# Patient Record
Sex: Female | Born: 1955
Health system: Southern US, Community
[De-identification: ages and names within clinical notes are randomized; demographics above are authoritative.]

## PROBLEM LIST (undated history)

## (undated) DIAGNOSIS — Z860100 Personal history of colon polyps, unspecified: Secondary | ICD-10-CM

## (undated) DIAGNOSIS — I7300 Raynaud's syndrome without gangrene: Secondary | ICD-10-CM

## (undated) DIAGNOSIS — G43829 Menstrual migraine, not intractable, without status migrainosus: Secondary | ICD-10-CM

## (undated) DIAGNOSIS — I10 Essential (primary) hypertension: Secondary | ICD-10-CM

## (undated) DIAGNOSIS — J4520 Mild intermittent asthma, uncomplicated: Secondary | ICD-10-CM

## (undated) DIAGNOSIS — Z8742 Personal history of other diseases of the female genital tract: Secondary | ICD-10-CM

## (undated) DIAGNOSIS — M8580 Other specified disorders of bone density and structure, unspecified site: Secondary | ICD-10-CM

## (undated) DIAGNOSIS — M5020 Other cervical disc displacement, unspecified cervical region: Secondary | ICD-10-CM

## (undated) DIAGNOSIS — D219 Benign neoplasm of connective and other soft tissue, unspecified: Secondary | ICD-10-CM

## (undated) DIAGNOSIS — T7840XA Allergy, unspecified, initial encounter: Secondary | ICD-10-CM

## (undated) DIAGNOSIS — G510 Bell's palsy: Secondary | ICD-10-CM

## (undated) HISTORY — DX: Personal history of colon polyps, unspecified: Z86.0100

## (undated) HISTORY — DX: Raynaud's syndrome without gangrene: I73.00

## (undated) HISTORY — PX: NASAL SINUS SURGERY: SHX719

## (undated) HISTORY — DX: Allergy, unspecified, initial encounter: T78.40XA

## (undated) HISTORY — DX: Menstrual migraine, not intractable, without status migrainosus: G43.829

## (undated) HISTORY — DX: Other cervical disc displacement, unspecified cervical region: M50.20

## (undated) HISTORY — DX: Essential (primary) hypertension: I10

## (undated) HISTORY — PX: SPINE SURGERY: SHX786

## (undated) HISTORY — DX: Personal history of other diseases of the female genital tract: Z87.42

## (undated) HISTORY — DX: Benign neoplasm of connective and other soft tissue, unspecified: D21.9

## (undated) HISTORY — DX: Other specified disorders of bone density and structure, unspecified site: M85.80

## (undated) HISTORY — DX: Bell's palsy: G51.0

## (undated) HISTORY — DX: Mild intermittent asthma, uncomplicated: J45.20

## (undated) HISTORY — DX: Personal history of colonic polyps: Z86.010

---

## 1982-02-14 HISTORY — PX: COLPOSCOPY: SHX161

## 1983-02-15 HISTORY — PX: CERVICAL CONE BIOPSY: SUR198

## 1989-02-14 HISTORY — PX: CERVICAL BIOPSY  W/ LOOP ELECTRODE EXCISION: SUR135

## 1997-05-21 ENCOUNTER — Other Ambulatory Visit: Admission: RE | Admit: 1997-05-21 | Discharge: 1997-05-21 | Payer: Self-pay | Admitting: *Deleted

## 1997-12-09 ENCOUNTER — Other Ambulatory Visit: Admission: RE | Admit: 1997-12-09 | Discharge: 1997-12-09 | Payer: Self-pay | Admitting: Obstetrics and Gynecology

## 1999-01-05 ENCOUNTER — Other Ambulatory Visit: Admission: RE | Admit: 1999-01-05 | Discharge: 1999-01-05 | Payer: Self-pay | Admitting: Obstetrics and Gynecology

## 1999-11-16 ENCOUNTER — Other Ambulatory Visit: Admission: RE | Admit: 1999-11-16 | Discharge: 1999-11-16 | Payer: Self-pay | Admitting: Gynecology

## 2000-01-24 ENCOUNTER — Other Ambulatory Visit: Admission: RE | Admit: 2000-01-24 | Discharge: 2000-01-24 | Payer: Self-pay | Admitting: Obstetrics and Gynecology

## 2000-03-10 ENCOUNTER — Ambulatory Visit (HOSPITAL_COMMUNITY): Admission: AD | Admit: 2000-03-10 | Discharge: 2000-03-10 | Payer: Self-pay | Admitting: Obstetrics & Gynecology

## 2001-03-02 ENCOUNTER — Other Ambulatory Visit: Admission: RE | Admit: 2001-03-02 | Discharge: 2001-03-02 | Payer: Self-pay | Admitting: Obstetrics and Gynecology

## 2003-03-04 ENCOUNTER — Other Ambulatory Visit: Admission: RE | Admit: 2003-03-04 | Discharge: 2003-03-04 | Payer: Self-pay | Admitting: Obstetrics and Gynecology

## 2004-04-16 ENCOUNTER — Other Ambulatory Visit: Admission: RE | Admit: 2004-04-16 | Discharge: 2004-04-16 | Payer: Self-pay | Admitting: Obstetrics and Gynecology

## 2004-06-24 ENCOUNTER — Ambulatory Visit: Payer: Self-pay | Admitting: Unknown Physician Specialty

## 2005-05-30 ENCOUNTER — Other Ambulatory Visit: Admission: RE | Admit: 2005-05-30 | Discharge: 2005-05-30 | Payer: Self-pay | Admitting: Obstetrics and Gynecology

## 2007-07-03 ENCOUNTER — Ambulatory Visit: Payer: Self-pay | Admitting: Unknown Physician Specialty

## 2007-12-19 ENCOUNTER — Ambulatory Visit: Payer: Self-pay | Admitting: Unknown Physician Specialty

## 2008-03-07 LAB — HM COLONOSCOPY: HM Colonoscopy: NORMAL

## 2008-12-30 ENCOUNTER — Ambulatory Visit: Payer: Self-pay | Admitting: Unknown Physician Specialty

## 2010-01-22 ENCOUNTER — Ambulatory Visit: Payer: Self-pay | Admitting: Unknown Physician Specialty

## 2010-04-08 ENCOUNTER — Ambulatory Visit: Payer: Self-pay | Admitting: Unknown Physician Specialty

## 2011-01-10 ENCOUNTER — Ambulatory Visit: Payer: Self-pay | Admitting: Internal Medicine

## 2011-01-12 ENCOUNTER — Ambulatory Visit (INDEPENDENT_AMBULATORY_CARE_PROVIDER_SITE_OTHER): Payer: BC Managed Care – PPO | Admitting: Internal Medicine

## 2011-01-12 ENCOUNTER — Encounter: Payer: Self-pay | Admitting: Internal Medicine

## 2011-01-12 VITALS — BP 126/84 | HR 74 | Temp 97.9°F | Resp 14 | Ht 64.0 in | Wt 150.2 lb

## 2011-01-12 DIAGNOSIS — R635 Abnormal weight gain: Secondary | ICD-10-CM

## 2011-01-12 DIAGNOSIS — G43829 Menstrual migraine, not intractable, without status migrainosus: Secondary | ICD-10-CM | POA: Insufficient documentation

## 2011-01-12 DIAGNOSIS — Z13220 Encounter for screening for lipoid disorders: Secondary | ICD-10-CM

## 2011-01-12 DIAGNOSIS — D126 Benign neoplasm of colon, unspecified: Secondary | ICD-10-CM

## 2011-01-12 DIAGNOSIS — K635 Polyp of colon: Secondary | ICD-10-CM

## 2011-01-12 DIAGNOSIS — J449 Chronic obstructive pulmonary disease, unspecified: Secondary | ICD-10-CM

## 2011-01-12 DIAGNOSIS — Z1239 Encounter for other screening for malignant neoplasm of breast: Secondary | ICD-10-CM

## 2011-01-12 DIAGNOSIS — I10 Essential (primary) hypertension: Secondary | ICD-10-CM

## 2011-01-12 DIAGNOSIS — J329 Chronic sinusitis, unspecified: Secondary | ICD-10-CM

## 2011-01-12 DIAGNOSIS — G43909 Migraine, unspecified, not intractable, without status migrainosus: Secondary | ICD-10-CM | POA: Insufficient documentation

## 2011-01-12 DIAGNOSIS — M542 Cervicalgia: Secondary | ICD-10-CM

## 2011-01-12 NOTE — Assessment & Plan Note (Signed)
Nonsmoker, prior eval by Dr. Gevena Cotton, [facility]  confiirmed asthma with PFTs ,  Controlled with advair used prn (patient preference)

## 2011-01-12 NOTE — Progress Notes (Signed)
Subjective:    Patient ID: Jean Luna, female    DOB: [birth year], 55 y.o.   MRN: [id number]  HPI 55 yo white female referred by the [facility] for primary care.   She has a history of several sinus surgeries in the past  allergic rhinitis and chronic bronchitis with  A history of migraines which have remitted since entering menopause.  Her Cc is the development of exertional dyspnea for the last several months.  She has gained some weight and has not exercised regularly for the past several years.  Denies chest pain, nausea, jaw pain and diaphoresis.  No claudication symptoms.  Past Medical History  Diagnosis Date  . Hypertension   . Asthma   . Migraine, menstrual     resolved with menopause    No current outpatient prescriptions on file prior to visit.    Review of Systems  Constitutional: Negative for fever, chills and unexpected weight change.  HENT: Negative for hearing loss, ear pain, nosebleeds, congestion, sore throat, facial swelling, rhinorrhea, sneezing, mouth sores, trouble swallowing, neck pain, neck stiffness, voice change, postnasal drip, sinus pressure, tinnitus and ear discharge.   Eyes: Negative for pain, discharge, redness and visual disturbance.  Respiratory: Negative for cough, chest tightness, shortness of breath, wheezing and stridor.   Cardiovascular: Negative for chest pain, palpitations and leg swelling.  Musculoskeletal: Negative for myalgias and arthralgias.  Skin: Negative for color change and rash.  Neurological: Negative for dizziness, weakness, light-headedness and headaches.  Hematological: Negative for adenopathy.   BP 126/84  Pulse 74  Temp(Src) 97.9 F (36.6 C) (Oral)  Resp 14  Ht 5\' 4"  (1.626 m)  Wt 150 lb 4 oz (68.153 kg)  BMI 25.79 kg/m2  SpO2 100%     Objective:   Physical Exam  Constitutional: She is oriented to person, place, and time. She appears well-developed and well-nourished.  HENT:  Mouth/Throat: Oropharynx is clear  and moist.  Eyes: EOM are normal. Pupils are equal, round, and reactive to light. No scleral icterus.  Neck: Normal range of motion. Neck supple. No JVD present. No thyromegaly present.  Cardiovascular: Normal rate, regular rhythm, normal heart sounds and intact distal pulses.   Pulmonary/Chest: Effort normal and breath sounds normal.  Abdominal: Soft. Bowel sounds are normal. She exhibits no mass. There is no tenderness.  Musculoskeletal: Normal range of motion. She exhibits no edema.  Lymphadenopathy:    She has no cervical adenopathy.  Neurological: She is alert and oriented to person, place, and time.  Skin: Skin is warm and dry.  Psychiatric: She has a normal mood and affect.       Assessment & Plan:  Exertional dyspnea:  May be due to deconditioning, pulmonary disease, or occult CAD.  Will have her return for fasting lipids to assess risk factors and consider either stress testing or PFTS as next course.   Bronchitis with chronic airway obstruction Nonsmoker, prior eval by Dr. Gevena Cotton, [facility]  confiirmed asthma with PFTs ,  Controlled with advair used prn (patient preference)  Colon polyps By colonoscopy in 2007.  Repeat scope in 2010 was clear. Next one due 2015.  Cervicalgia MRI of cervical spine ordered to rule out spinal stenosis    Updated Medication List Outpatient Encounter Prescriptions as of 01/12/2011  Medication Sig Dispense Refill  . cetirizine (ZYRTEC) 10 MG tablet Take 10 mg by mouth daily.        . fluticasone-salmeterol (ADVAIR HFA) 115-21 MCG/ACT inhaler Inhale 2 puffs into  the lungs daily.        Marland Kitchen telmisartan (MICARDIS) 20 MG tablet Take 20 mg by mouth daily.        . zileuton (ZYFLO) 600 MG TABS Take 600 mg by mouth daily.

## 2011-01-13 ENCOUNTER — Encounter: Payer: Self-pay | Admitting: Internal Medicine

## 2011-01-13 DIAGNOSIS — K635 Polyp of colon: Secondary | ICD-10-CM | POA: Insufficient documentation

## 2011-01-13 DIAGNOSIS — D126 Benign neoplasm of colon, unspecified: Secondary | ICD-10-CM | POA: Insufficient documentation

## 2011-01-13 NOTE — Assessment & Plan Note (Signed)
MRI of cervical spine ordered to rule out spinal stenosis

## 2011-01-13 NOTE — Assessment & Plan Note (Signed)
By colonoscopy in 2007.  Repeat scope in 2010 was clear. Next one due 2015.

## 2011-01-21 ENCOUNTER — Other Ambulatory Visit: Payer: BC Managed Care – PPO

## 2011-01-25 ENCOUNTER — Other Ambulatory Visit (HOSPITAL_COMMUNITY)
Admission: RE | Admit: 2011-01-25 | Discharge: 2011-01-25 | Disposition: A | Discharge status: Home / Self Care | Payer: BC Managed Care – PPO | Source: Ambulatory Visit | Attending: Internal Medicine | Admitting: Internal Medicine

## 2011-01-25 ENCOUNTER — Ambulatory Visit (INDEPENDENT_AMBULATORY_CARE_PROVIDER_SITE_OTHER): Payer: BC Managed Care – PPO | Admitting: Internal Medicine

## 2011-01-25 ENCOUNTER — Encounter: Payer: Self-pay | Admitting: Internal Medicine

## 2011-01-25 VITALS — BP 126/84 | HR 77 | Temp 98.3°F | Resp 16 | Ht 64.0 in | Wt 150.8 lb

## 2011-01-25 DIAGNOSIS — R5383 Other fatigue: Secondary | ICD-10-CM

## 2011-01-25 DIAGNOSIS — Z1239 Encounter for other screening for malignant neoplasm of breast: Secondary | ICD-10-CM

## 2011-01-25 DIAGNOSIS — Z780 Asymptomatic menopausal state: Secondary | ICD-10-CM

## 2011-01-25 DIAGNOSIS — G510 Bell's palsy: Secondary | ICD-10-CM | POA: Insufficient documentation

## 2011-01-25 DIAGNOSIS — Z13220 Encounter for screening for lipoid disorders: Secondary | ICD-10-CM

## 2011-01-25 DIAGNOSIS — Z124 Encounter for screening for malignant neoplasm of cervix: Secondary | ICD-10-CM

## 2011-01-25 DIAGNOSIS — I7300 Raynaud's syndrome without gangrene: Secondary | ICD-10-CM | POA: Insufficient documentation

## 2011-01-25 DIAGNOSIS — M542 Cervicalgia: Secondary | ICD-10-CM

## 2011-01-25 DIAGNOSIS — R5381 Other malaise: Secondary | ICD-10-CM

## 2011-01-25 DIAGNOSIS — Z01419 Encounter for gynecological examination (general) (routine) without abnormal findings: Secondary | ICD-10-CM | POA: Insufficient documentation

## 2011-01-25 DIAGNOSIS — Z1159 Encounter for screening for other viral diseases: Secondary | ICD-10-CM | POA: Insufficient documentation

## 2011-01-25 DIAGNOSIS — R635 Abnormal weight gain: Secondary | ICD-10-CM

## 2011-01-25 DIAGNOSIS — I10 Essential (primary) hypertension: Secondary | ICD-10-CM

## 2011-01-25 DIAGNOSIS — N951 Menopausal and female climacteric states: Secondary | ICD-10-CM

## 2011-01-25 NOTE — Assessment & Plan Note (Addendum)
She has been having neck pain with radiculopathy to the right hand for the last 6 months, with numbness in the thumb and 2nd finger . She has been seeing a chiropractor , who has diagnosed cervical spondylosis,  MRI cervical spine to be ordered.

## 2011-01-25 NOTE — Assessment & Plan Note (Signed)
currently borderline.   No indications for treatment since home bp readings have been 120/80 or less.

## 2011-01-25 NOTE — Progress Notes (Signed)
Subjective:    Patient ID: Jean Luna, female    DOB: December 05, 1955, 55 y.o.   MRN: [id number]  HPI  Here for her annual PE with PAP.  Having some short term memory , word finding problems.    On Viuvelle for HRT which has helped the hot fhashes tremondously   Review of Systems     Objective:   Physical Exam        Assessment & Plan:   Subjective:    Jean Luna is a 55 y.o. female who presents for an annual exam. The patient has no complaints today. The patient is sexually active. GYN screening history: last pap: was normal. The patient wears seatbelts: yes. The patient participates in regular exercise: no. Has the patient ever been transfused or tattooed?: no. The patient reports that there is not domestic violence in her life.   Menstrual History: OB History    Grav Para Term Preterm Abortions TAB SAB Ect Mult Living                  Menarche age: 45 No LMP recorded. Patient is postmenopausal.    The following portions of the patient's history were reviewed and updated as appropriate: allergies, current medications, past family history, past medical history, past social history, past surgical history and problem list.  Review of Systems A comprehensive review of systems was negative except for: Musculoskeletal: positive for muscle weakness and neck pain Neurological: positive for paresthesia and weakness right forearm thumb and index finger   Objective:    BP 126/84  Pulse 77  Temp(Src) 98.3 F (36.8 C) (Oral)  Resp 16  Ht 5\' 4"  (1.626 m)  Wt 150 lb 12 oz (68.38 kg)  BMI 25.88 kg/m2  SpO2 98%  General Appearance:    Alert, cooperative, no distress, appears stated age  Head:    Normocephalic, without obvious abnormality, atraumatic  Eyes:    PERRL, conjunctiva/corneas clear, EOM's intact, fundi    benign, both eyes  Ears:    Normal TM's and external ear canals, both ears  Nose:   Nares normal, septum midline, mucosa normal, no drainage    or sinus  tenderness  Throat:   Lips, mucosa, and tongue normal; teeth and gums normal  Neck:   Supple, symmetrical, trachea midline, no adenopathy;    thyroid:  no enlargement/tenderness/nodules; no carotid   bruit or JVD  Back:     Symmetric, no curvature, ROM normal, no CVA tenderness  Lungs:     Clear to auscultation bilaterally, respirations unlabored  Chest Wall:    No tenderness or deformity   Heart:    Regular rate and rhythm, S1 and S2 normal, no murmur, rub   or gallop  Breast Exam:    No tenderness, masses, or nipple abnormality  Abdomen:     Soft, non-tender, bowel sounds active all four quadrants,    no masses, no organomegaly  Genitalia:    Normal female without lesion, discharge or tenderness  Rectal:    Normal tone, normal prostate, no masses or tenderness;   guaiac negative stool  Extremities:   Extremities normal, atraumatic, no cyanosis or edema  Pulses:   2+ and symmetric all extremities  Skin:   Skin color, texture, turgor normal, no rashes or lesions  Lymph nodes:   Cervical, supraclavicular, and axillary nodes normal  Neurologic:   CNII-XII intact, normal strength, sensation and reflexes    throughout  .    Assessment:  Cervicalgia She has been having neck pain with radiculopathy to the right hand for the last 6 months, with numbness in the thumb and 2nd finger . She has been seeing a chiropractor , who has diagnosed cervical spondylosis,  MRI cervical spine to be ordered.    Hypertension currently borderline.   No indications for treatment since home bp readings have been 120/80 or less.     Updated Medication List Outpatient Encounter Prescriptions as of 01/25/2011  Medication Sig Dispense Refill  . cetirizine (ZYRTEC) 10 MG tablet Take 10 mg by mouth daily.        Marland Kitchen estradiol (VIVELLE-DOT) 0.025 MG/24HR Place 1 patch onto the skin 2 (two) times a week.        . fluticasone-salmeterol (ADVAIR HFA) 115-21 MCG/ACT inhaler Inhale 2 puffs into the lungs daily.          . progesterone (PROMETRIUM) 100 MG capsule Take 100 mg by mouth daily.        Marland Kitchen telmisartan (MICARDIS) 20 MG tablet Take 20 mg by mouth daily.        . zileuton (ZYFLO) 600 MG TABS Take 600 mg by mouth daily.          Healthy female exam.    Plan:     Await pap smear results. Blood tests: CBC with diff, Comprehensive metabolic panel, Lipoproteins and TSH. Mammogram.

## 2011-02-01 ENCOUNTER — Other Ambulatory Visit (INDEPENDENT_AMBULATORY_CARE_PROVIDER_SITE_OTHER): Payer: BC Managed Care – PPO | Admitting: *Deleted

## 2011-02-01 DIAGNOSIS — R635 Abnormal weight gain: Secondary | ICD-10-CM

## 2011-02-01 DIAGNOSIS — R5383 Other fatigue: Secondary | ICD-10-CM

## 2011-02-01 DIAGNOSIS — R5381 Other malaise: Secondary | ICD-10-CM

## 2011-02-01 LAB — CBC WITH DIFFERENTIAL/PLATELET
Basophils Absolute: 0 10*3/uL (ref 0.0–0.1)
Eosinophils Absolute: 0.4 10*3/uL (ref 0.0–0.7)
HCT: 42.6 % (ref 36.0–46.0)
Lymphs Abs: 1.9 10*3/uL (ref 0.7–4.0)
MCHC: 34.6 g/dL (ref 30.0–36.0)
Monocytes Absolute: 0.5 10*3/uL (ref 0.1–1.0)
Monocytes Relative: 6.7 % (ref 3.0–12.0)
Neutro Abs: 4.2 10*3/uL (ref 1.4–7.7)
Platelets: 280 10*3/uL (ref 150.0–400.0)
RDW: 12.7 % (ref 11.5–14.6)

## 2011-02-01 LAB — LIPID PANEL
Cholesterol: 173 mg/dL (ref 0–200)
HDL: 52.3 mg/dL (ref >39.00)
Total CHOL/HDL Ratio: 3
Triglycerides: 102 mg/dL (ref 0.0–149.0)

## 2011-02-01 NOTE — Progress Notes (Signed)
Addended by: Jobie Quaker on: 02/01/2011 04:56 PM   Modules accepted: Orders

## 2011-02-02 ENCOUNTER — Encounter: Payer: Self-pay | Admitting: *Deleted

## 2011-02-02 ENCOUNTER — Ambulatory Visit: Payer: Self-pay | Admitting: Internal Medicine

## 2011-02-02 ENCOUNTER — Telehealth: Payer: Self-pay | Admitting: Internal Medicine

## 2011-02-02 DIAGNOSIS — M542 Cervicalgia: Secondary | ICD-10-CM

## 2011-02-02 NOTE — Telephone Encounter (Signed)
Her cervical spine MRI was abnromal ,  She has a problem at C5-C6 on the right, due to to a bone spur and disk bulge that is causing spinal stenosis and nerve root impingement.  I recommed that she she a neurosurgeon,  .  If she has no preference I recommend [facility] in [place] .  Dr Eugene Gavia or [place]. We will refer if she say go ahead.

## 2011-02-03 NOTE — Telephone Encounter (Signed)
Left message asking patient to receive my call.

## 2011-02-04 NOTE — Telephone Encounter (Signed)
Carollee Herter please  Refer patient ot [facility].  See phone note attached

## 2011-02-04 NOTE — Telephone Encounter (Signed)
Patient notified. She is okay with the referral. She will wait to hear from shannon.

## 2011-02-13 LAB — HM COLONOSCOPY

## 2011-02-28 ENCOUNTER — Encounter: Payer: Self-pay | Admitting: Internal Medicine

## 2011-02-28 LAB — HM MAMMOGRAPHY: HM Mammogram: NORMAL

## 2011-03-16 ENCOUNTER — Ambulatory Visit: Payer: Self-pay | Admitting: Internal Medicine

## 2011-04-20 ENCOUNTER — Encounter: Payer: Self-pay | Admitting: Internal Medicine

## 2011-05-06 ENCOUNTER — Telehealth: Payer: Self-pay | Admitting: Internal Medicine

## 2011-05-06 NOTE — Telephone Encounter (Signed)
[phone] PT CAME IN JUST CHECKING RESULTS ON HER PAP IN DEC AND MAMMOGRAM IN [facility]

## 2011-05-06 NOTE — Telephone Encounter (Signed)
Left message for patient to return call.

## 2011-05-09 NOTE — Telephone Encounter (Signed)
Carlyon Shadow notified patient we sent her letters out when the results came in.  Morrie Sheldon notified her of the results over the phone today.

## 2011-05-12 ENCOUNTER — Other Ambulatory Visit: Payer: Self-pay | Admitting: *Deleted

## 2011-05-12 DIAGNOSIS — Z780 Asymptomatic menopausal state: Secondary | ICD-10-CM

## 2011-05-12 MED ORDER — PROGESTERONE MICRONIZED 100 MG PO CAPS: 100.0000 mg | ORAL_CAPSULE | Freq: Every day | ORAL | Status: DC

## 2011-05-16 ENCOUNTER — Other Ambulatory Visit: Payer: Self-pay | Admitting: Internal Medicine

## 2011-05-16 DIAGNOSIS — Z780 Asymptomatic menopausal state: Secondary | ICD-10-CM

## 2011-05-16 MED ORDER — PROGESTERONE MICRONIZED 100 MG PO CAPS: 100.0000 mg | ORAL_CAPSULE | Freq: Every day | ORAL | Status: DC

## 2011-05-19 ENCOUNTER — Other Ambulatory Visit: Payer: Self-pay | Admitting: Internal Medicine

## 2011-05-19 DIAGNOSIS — Z780 Asymptomatic menopausal state: Secondary | ICD-10-CM

## 2011-05-19 MED ORDER — PROGESTERONE MICRONIZED 100 MG PO CAPS: 100.0000 mg | ORAL_CAPSULE | Freq: Every day | ORAL | Status: DC

## 2011-09-02 ENCOUNTER — Other Ambulatory Visit: Payer: Self-pay | Admitting: *Deleted

## 2011-09-02 DIAGNOSIS — Z780 Asymptomatic menopausal state: Secondary | ICD-10-CM

## 2011-09-02 MED ORDER — ESTRADIOL 0.025 MG/24HR TD PTTW: 1.0000 | MEDICATED_PATCH | TRANSDERMAL | Status: DC

## 2011-09-06 ENCOUNTER — Other Ambulatory Visit: Payer: Self-pay | Admitting: Internal Medicine

## 2011-09-06 DIAGNOSIS — Z780 Asymptomatic menopausal state: Secondary | ICD-10-CM

## 2011-09-06 MED ORDER — ESTRADIOL 0.025 MG/24HR TD PTTW: 1.0000 | MEDICATED_PATCH | TRANSDERMAL | Status: DC

## 2011-10-12 ENCOUNTER — Other Ambulatory Visit: Payer: Self-pay | Admitting: Internal Medicine

## 2011-10-12 DIAGNOSIS — Z780 Asymptomatic menopausal state: Secondary | ICD-10-CM

## 2011-10-12 MED ORDER — PROGESTERONE MICRONIZED 100 MG PO CAPS: 100.0000 mg | ORAL_CAPSULE | Freq: Every day | ORAL | Status: DC

## 2012-03-06 ENCOUNTER — Ambulatory Visit (INDEPENDENT_AMBULATORY_CARE_PROVIDER_SITE_OTHER): Payer: BC Managed Care – PPO | Admitting: Internal Medicine

## 2012-03-06 ENCOUNTER — Encounter: Payer: Self-pay | Admitting: Internal Medicine

## 2012-03-06 VITALS — BP 130/84 | HR 66 | Temp 98.0°F | Resp 16 | Wt 147.5 lb

## 2012-03-06 DIAGNOSIS — R0683 Snoring: Secondary | ICD-10-CM

## 2012-03-06 DIAGNOSIS — M542 Cervicalgia: Secondary | ICD-10-CM

## 2012-03-06 DIAGNOSIS — I10 Essential (primary) hypertension: Secondary | ICD-10-CM

## 2012-03-06 DIAGNOSIS — R0989 Other specified symptoms and signs involving the circulatory and respiratory systems: Secondary | ICD-10-CM

## 2012-03-06 DIAGNOSIS — M4802 Spinal stenosis, cervical region: Secondary | ICD-10-CM

## 2012-03-06 DIAGNOSIS — R0609 Other forms of dyspnea: Secondary | ICD-10-CM

## 2012-03-06 LAB — MICROALBUMIN / CREATININE URINE RATIO
Creatinine,U: 45.4 mg/dL
Microalb Creat Ratio: 0.7 mg/g (ref 0.0–30.0)
Microalb, Ur: 0.3 mg/dL (ref 0.0–1.9)

## 2012-03-06 LAB — BASIC METABOLIC PANEL
Chloride: 104 mEq/L (ref 96–112)
GFR: 96.17 mL/min (ref >60.00)
Potassium: 4.1 mEq/L (ref 3.5–5.1)
Sodium: 138 mEq/L (ref 135–145)

## 2012-03-06 MED ORDER — TELMISARTAN 40 MG PO TABS: 40.0000 mg | ORAL_TABLET | Freq: Every day | ORAL | Status: DC

## 2012-03-06 NOTE — Progress Notes (Signed)
Patient ID: Jean Luna, female   DOB: [birth year], 57 y.o.   MRN: [id number]  Patient Active Problem List  Diagnosis  . Bronchitis with chronic airway obstruction  . Sinusitis, chronic  . Hypertension  . Migraine, menstrual  . Cervicalgia  . Colon polyps  . Raynaud phenomenon  . Facial paralysis/Bells palsy  . Snoring disorder    Subjective:  CC:   Chief Complaint  Patient presents with  . Elevated BP    HPI:   Jean Furlough Kirkpatrickis a 57 y.o. female who presents with multiple issues needing follow up after one year.  She has been having elevated bp for the last several weeks. Has been taking the advair and nasal spray and zyflo, but Not using sudafed or sudafed PE .  Takes aleve and motrin occasionally, average 2 or 3 days per week for arthritis in both thumbs. Has been taking a glucosamine supplement which is really helping her joints.  Home bps have been  158/105,  Started the micardis a week ago,  138/91 after 4 or 5 days. 134/81 this morning at home.    Snores,  But no prior sleep apnea study.  Wakes up tired in the morning. Falls asleep easily .  Sleepy during the day, takes a nap if home. Sleeps sitting up bc of sinus issues.  Has lost weight.   Cervical spine issues.,. Still sympotmatic,  Wants second opinon on need for surgery whichw as offered by Delma Officer.   Past Medical History  Diagnosis Date  . Hypertension   . Asthma   . Migraine, menstrual     resolved with menopause  . Raynaud phenomenon   . Facial paralysis/Bells palsy     6 week paralysis of right side of face     Past Surgical History  Procedure Date  . Nasal sinus surgery   . Cervical cone biopsy 1985  . Cervical biopsy  w/ loop electrode excision 1991         The following portions of the patient's history were reviewed and updated as appropriate: Allergies, current medications, and problem list.    Review of Systems:   Patient denies headache, fevers, malaise, unintentional  weight loss, skin rash, eye pain, sinus congestion and sinus pain, sore throat, dysphagia,  hemoptysis , cough, dyspnea, wheezing, chest pain, palpitations, orthopnea, edema, abdominal pain, nausea, melena, diarrhea, constipation, flank pain, dysuria, hematuria, urinary  Frequency, nocturia, numbness, tingling, seizures,  Focal weakness, Loss of consciousness,  Tremor, insomnia, depression, anxiety, and suicidal ideation.      History   Social History  . Marital Status: Married    Spouse Name: N/A    Number of Children: N/A  . Years of Education: N/A   Occupational History  . Not on file.   Social History Main Topics  . Smoking status: Never Smoker   . Smokeless tobacco: Never Used  . Alcohol Use: No  . Drug Use: No  . Sexually Active: Not on file   Other Topics Concern  . Not on file   Social History Narrative  . No narrative on file    Objective:  BP 130/84  Pulse 66  Temp 98 F (36.7 C) (Oral)  Resp 16  Wt 147 lb 8 oz (66.906 kg)  SpO2 97%  General appearance: alert, cooperative and appears stated age Ears: normal TM's and external ear canals both ears Throat: lips, mucosa, and tongue normal; teeth and gums normal Neck: no adenopathy, no carotid bruit, supple, symmetrical, trachea  midline and thyroid not enlarged, symmetric, no tenderness/mass/nodules Back: symmetric, no curvature. ROM normal. No CVA tenderness. Lungs: clear to auscultation bilaterally Heart: regular rate and rhythm, S1, S2 normal, no murmur, click, rub or gallop Abdomen: soft, non-tender; bowel sounds normal; no masses,  no organomegaly Pulses: 2+ and symmetric Skin: Skin color, texture, turgor normal. No rashes or lesions Lymph nodes: Cervical, supraclavicular, and axillary nodes normal.  Assessment and Plan:  Hypertension Recurrent, previously resolved with weight loss. Accompanied by snoring, will screen for sleep apnea as secondary causes.  continue current medications.  Renal function  and urine were normal. Patient will correlate use of NSAIDs with elevations as this may be contributing.   Cervicalgia Secondary to DDD of cervical spine with foraminal stenosis worse at C5-C6 level. Surgery recommended by [place] Neurosurgeon.  She is symptomatic and  requesting a second opinion by tertiary care center surgeon.  [facility] referral in process.   Snoring disorder With daytime somnolence and hypertension.  Referral for sleep study  To rule out OSA   Updated Medication List Outpatient Encounter Prescriptions as of 03/06/2012  Medication Sig Dispense Refill  . Azelastine-Fluticasone (DYMISTA) 137-50 MCG/ACT SUSP Place 1 spray into both nostrils daily.      Marland Kitchen estradiol (VIVELLE-DOT) 0.025 MG/24HR Place 1 patch onto the skin 2 (two) times a week.  8 patch  11  . fluticasone-salmeterol (ADVAIR HFA) 115-21 MCG/ACT inhaler Inhale 2 puffs into the lungs daily.        . progesterone (PROMETRIUM) 100 MG capsule Take 1 capsule (100 mg total) by mouth daily.  30 capsule  3  . telmisartan (MICARDIS) 40 MG tablet Take 1 tablet (40 mg total) by mouth daily.  90 tablet  2  . zileuton (ZYFLO) 600 MG TABS Take 600 mg by mouth daily.        . [DISCONTINUED] telmisartan (MICARDIS) 20 MG tablet Take 40 mg by mouth daily.       . [DISCONTINUED] cetirizine (ZYRTEC) 10 MG tablet Take 10 mg by mouth daily.           Orders Placed This Encounter  Procedures  . Basic metabolic panel  . Microalbumin / creatinine urine ratio  . Ambulatory referral to Neurosurgery    No Follow-up on file.

## 2012-03-06 NOTE — Patient Instructions (Signed)
Continue your current medications.    please keep a diary of the your use of motrin/aleve as it relates to your blood pressure readings  Sleep study to be done piror to physical  2nd opinion from [facility] on the cervical stenosis surgery  BMET and urine test today for protein

## 2012-03-07 ENCOUNTER — Encounter: Payer: Self-pay | Admitting: Internal Medicine

## 2012-03-07 DIAGNOSIS — R0683 Snoring: Secondary | ICD-10-CM | POA: Insufficient documentation

## 2012-03-07 NOTE — Assessment & Plan Note (Signed)
Recurrent, previously resolved with weight loss. Accompanied by snoring, will screen for sleep apnea as secondary causes.  continue current medications.  Renal function and urine were normal.

## 2012-03-07 NOTE — Assessment & Plan Note (Signed)
Secondary to DDD of cervical spine with foraminal stenosis worse at C5-C6 level. Surgery recommended by [place] Neurosurgeon.  She is symptomatic and  requesting a second opinion by tertiary care center surgeon.  [facility] referral in process.

## 2012-03-07 NOTE — Assessment & Plan Note (Signed)
With daytime somnolence and hypertension.  Referral for sleep study  To rule out OSA

## 2012-03-20 ENCOUNTER — Other Ambulatory Visit: Payer: Self-pay | Admitting: General Practice

## 2012-03-20 DIAGNOSIS — Z780 Asymptomatic menopausal state: Secondary | ICD-10-CM

## 2012-03-20 MED ORDER — PROGESTERONE MICRONIZED 100 MG PO CAPS: 100.0000 mg | ORAL_CAPSULE | Freq: Every day | ORAL | Status: DC

## 2012-03-20 NOTE — Telephone Encounter (Signed)
Med filled.  

## 2012-04-06 ENCOUNTER — Ambulatory Visit: Payer: Self-pay | Admitting: Internal Medicine

## 2012-04-13 LAB — HM MAMMOGRAPHY: HM Mammogram: NORMAL

## 2012-06-12 ENCOUNTER — Other Ambulatory Visit (HOSPITAL_COMMUNITY)
Admission: RE | Admit: 2012-06-12 | Discharge: 2012-06-12 | Disposition: A | Discharge status: Home / Self Care | Payer: BC Managed Care – PPO | Source: Ambulatory Visit | Attending: Internal Medicine | Admitting: Internal Medicine

## 2012-06-12 ENCOUNTER — Ambulatory Visit (INDEPENDENT_AMBULATORY_CARE_PROVIDER_SITE_OTHER): Payer: BC Managed Care – PPO | Admitting: Internal Medicine

## 2012-06-12 ENCOUNTER — Encounter: Payer: Self-pay | Admitting: Internal Medicine

## 2012-06-12 VITALS — BP 122/76 | HR 76 | Temp 98.1°F | Resp 16 | Ht 63.0 in | Wt 150.0 lb

## 2012-06-12 DIAGNOSIS — Z0000 Encounter for general adult medical examination without abnormal findings: Secondary | ICD-10-CM

## 2012-06-12 DIAGNOSIS — Z124 Encounter for screening for malignant neoplasm of cervix: Secondary | ICD-10-CM

## 2012-06-12 DIAGNOSIS — R5383 Other fatigue: Secondary | ICD-10-CM

## 2012-06-12 DIAGNOSIS — N841 Polyp of cervix uteri: Secondary | ICD-10-CM

## 2012-06-12 DIAGNOSIS — E559 Vitamin D deficiency, unspecified: Secondary | ICD-10-CM

## 2012-06-12 DIAGNOSIS — E663 Overweight: Secondary | ICD-10-CM | POA: Insufficient documentation

## 2012-06-12 DIAGNOSIS — E785 Hyperlipidemia, unspecified: Secondary | ICD-10-CM

## 2012-06-12 DIAGNOSIS — R5381 Other malaise: Secondary | ICD-10-CM

## 2012-06-12 DIAGNOSIS — Z1211 Encounter for screening for malignant neoplasm of colon: Secondary | ICD-10-CM

## 2012-06-12 DIAGNOSIS — Z01419 Encounter for gynecological examination (general) (routine) without abnormal findings: Secondary | ICD-10-CM | POA: Insufficient documentation

## 2012-06-12 DIAGNOSIS — M542 Cervicalgia: Secondary | ICD-10-CM

## 2012-06-12 DIAGNOSIS — Z1151 Encounter for screening for human papillomavirus (HPV): Secondary | ICD-10-CM | POA: Insufficient documentation

## 2012-06-12 DIAGNOSIS — Z6825 Body mass index (BMI) 25.0-25.9, adult: Secondary | ICD-10-CM

## 2012-06-12 DIAGNOSIS — I10 Essential (primary) hypertension: Secondary | ICD-10-CM

## 2012-06-12 LAB — HM PAP SMEAR: HM PAP: NORMAL

## 2012-06-12 NOTE — Assessment & Plan Note (Signed)
She is scheduled to have an epidural steroid injection which has been delayed by recent episode of shingles.

## 2012-06-12 NOTE — Assessment & Plan Note (Signed)
Improved with suspension of one of her oral nutraceuticals which contiained cayenne

## 2012-06-12 NOTE — Progress Notes (Signed)
Patient ID: Jean Luna, female   DOB: [birth year], 57 y.o.   MRN: [id number]    Subjective:     Jean Luna is a 57 y.o. female and is here for a comprehensive physical exam. The patient reports neck pain with radiculopathy. Saw Dr. Senaida Ores at [facility] for back pain and PT referral ,  Had a left sided thoracic pruritic skin rash on April 3  Which was diagnosed as shingles by Erline Hau and treated with prednisone,  3 wk old rash so no acyclovir .  .which postponed her procedure. Scheduled for cervical spine epidural May 16th (first) for right shoulder radiculopathy with recurrence of pain recently. He has directed use of cervical traction twice daily which is helping.    At her last visit her bp was elevated.  She has since discovered it was her nutraceutical that contains cayenne , so she is using it sparingly (Capsule) . Tolerating  micardis.     History   Social History  . Marital Status: Married    Spouse Name: N/A    Number of Children: N/A  . Years of Education: N/A   Occupational History  . Not on file.   Social History Main Topics  . Smoking status: Never Smoker   . Smokeless tobacco: Never Used  . Alcohol Use: No  . Drug Use: No  . Sexually Active: Not on file   Other Topics Concern  . Not on file   Social History Narrative  . No narrative on file   Health Maintenance  Topic Date Due  . Tetanus/tdap  06/12/1973  . Influenza Vaccine  10/15/2012  . Pap Smear  01/24/2014  . Mammogram  04/06/2014  . Colonoscopy  03/07/2018    The following portions of the patient's history were reviewed and updated as appropriate: allergies, current medications, past family history, past medical history, past social history, past surgical history and problem list.  Review of Systems A comprehensive review of systems was negative.   Objective:  BP 122/76  Pulse 76  Temp(Src) 98.1 F (36.7 C) (Oral)  Resp 16  Ht 5\' 3"  (1.6 m)  Wt 150 lb (68.04 kg)  BMI 26.58  kg/m2  SpO2 99%  General Appearance:    Alert, cooperative, no distress, appears stated age  Head:    Normocephalic, without obvious abnormality, atraumatic  Eyes:    PERRL, conjunctiva/corneas clear, EOM's intact, fundi    benign, both eyes  Ears:    Normal TM's and external ear canals, both ears  Nose:   Nares normal, septum midline, mucosa normal, no drainage    or sinus tenderness  Throat:   Lips, mucosa, and tongue normal; teeth and gums normal  Neck:   Supple, symmetrical, trachea midline, no adenopathy;    thyroid:  no enlargement/tenderness/nodules; no carotid   bruit or JVD  Back:     Symmetric, no curvature, ROM normal, no CVA tenderness  Lungs:     Clear to auscultation bilaterally, respirations unlabored  Chest Wall:    No tenderness or deformity   Heart:    Regular rate and rhythm, S1 and S2 normal, no murmur, rub   or gallop  Breast Exam:    No tenderness, masses, or nipple abnormality  Abdomen:     Soft, non-tender, bowel sounds active all four quadrants,    no masses, no organomegaly  Genitalia:    Pelvic: cervix normal in appearance, external genitalia normal, no adnexal masses or tenderness, no cervical motion tenderness,  rectovaginal septum normal, uterus normal size, shape, and consistency and vagina normal without discharge  Extremities:   Extremities normal, atraumatic, no cyanosis or edema  Pulses:   2+ and symmetric all extremities  Skin:   Skin color, texture, turgor normal, no rashes or lesions  Lymph nodes:   Cervical, supraclavicular, and axillary nodes normal  Neurologic:   CNII-XII intact, normal strength, sensation and reflexes    throughout    Assessment:.   Routine general medical examination at a health care facility Annual comprehensive exam was done including breast, pelvic and PAP smear. All screenings have been addressed .   Hypertension Improved with suspension of one of her oral nutraceuticals which contiained cayenne  Cervicalgia She is  scheduled to have an epidural steroid injection which has been delayed by recent episode of shingles.   Overweight (BMI 25.0-29.9) She is frustrated by her inbability to lose weight despite having a healthy diet.    recommended a low glycemic index diet utilizing smaller more frequent meals to increase metabolism.  I have also recommended that patient start exercising with a goal of 30 minutes of aerobic exercise a minimum of 5 days per week. Screening for lipid disorders, thyroid and diabetes to be done .   Updated Medication List Outpatient Encounter Prescriptions as of 06/12/2012  Medication Sig Dispense Refill  . Azelastine-Fluticasone (DYMISTA) 137-50 MCG/ACT SUSP Place 1 spray into both nostrils daily.      . cetirizine (ZYRTEC) 10 MG tablet Take 10 mg by mouth daily.      Marland Kitchen estradiol (VIVELLE-DOT) 0.025 MG/24HR Place 1 patch onto the skin 2 (two) times a week.  8 patch  11  . fluticasone-salmeterol (ADVAIR HFA) 115-21 MCG/ACT inhaler Inhale 2 puffs into the lungs daily.        . predniSONE (STERAPRED UNI-PAK) 10 MG tablet Take 10 mg by mouth daily.      . progesterone (PROMETRIUM) 100 MG capsule Take 1 capsule (100 mg total) by mouth daily.  30 capsule  3  . telmisartan (MICARDIS) 40 MG tablet Take 1 tablet (40 mg total) by mouth daily.  90 tablet  2  . zileuton (ZYFLO) 600 MG TABS Take 600 mg by mouth daily.         No facility-administered encounter medications on file as of 06/12/2012.

## 2012-06-12 NOTE — Assessment & Plan Note (Signed)
She is frustrated by her inbability to lose weight despite having a healthy diet.    recommended a low glycemic index diet utilizing smaller more frequent meals to increase metabolism.  I have also recommended that patient start exercising with a goal of 30 minutes of aerobic exercise a minimum of 5 days per week. Screening for lipid disorders, thyroid and diabetes to be done .

## 2012-06-12 NOTE — Patient Instructions (Signed)
Return for fasting labs  Referral to GYN for cervical polyp Jean Luna if I can her)   Dr Mechele Collin for colonoscopy

## 2012-06-12 NOTE — Assessment & Plan Note (Signed)
Annual comprehensive exam was done including breast, pelvic and PAP smear. All screenings have been addressed .  

## 2012-06-20 ENCOUNTER — Encounter: Payer: Self-pay | Admitting: Obstetrics and Gynecology

## 2012-06-20 ENCOUNTER — Ambulatory Visit (INDEPENDENT_AMBULATORY_CARE_PROVIDER_SITE_OTHER): Payer: BC Managed Care – PPO | Admitting: Obstetrics and Gynecology

## 2012-06-20 VITALS — BP 120/82 | Ht 63.5 in | Wt 148.0 lb

## 2012-06-20 DIAGNOSIS — N951 Menopausal and female climacteric states: Secondary | ICD-10-CM

## 2012-06-20 DIAGNOSIS — Z87898 Personal history of other specified conditions: Secondary | ICD-10-CM

## 2012-06-20 DIAGNOSIS — Z8742 Personal history of other diseases of the female genital tract: Secondary | ICD-10-CM

## 2012-06-20 NOTE — Patient Instructions (Signed)
Hormonal Therapy for Women, Frequently Asked Questions  WHAT IS HORMONE THERAPY?  Hormone therapy (HT), estrogen and progesterone, provides women with the female hormones that decrease and are lost as women get older. When the hormone estrogen is given alone, it is usually referred to as "ERT." When the hormone progesterone is combined with estrogen, it is generally called "HT." Previously this was known as hormone replacement therapy (HRT). Estrogen is a female hormone that brings about changes in various organs in the body. Progesterone is a female hormone that prepares the uterus for a pregnancy each month. During the change-over to menopause ("perimenopause") these hormone levels start to decrease. This causes many uncomfortable symptoms (see below). When the ovaries stop producing estrogen and progesterone, menstrual periods come to an end. At this point, the woman has experienced menopause. Menopause is complete when a woman misses 12 consecutive menstrual periods.  WHAT ARE THE BENEFITS OF HORMONE THERAPY?  Hormone therapy has been used to relieve the short-term symptoms of menopause. These include:  · Hot flashes.  · Depression.  · Memory loss.  · Correcting irregular menstrual periods.  · Night sweats.  · Tiredness.  · Mood disturbances.  · Thinning of scalp hair.  · Disturbed sleep.  · Vaginal dryness.  · Painful intercourse.  · Loss of breast tissue.  Evidence shows that HT may be helpful in preventing colon cancer and bone loss (osteoporosis).  WHAT ARE THE SHORT-TERM RISKS OF HORMONE THERAPY?  · Some women report side effects from taking Hormone Therapy, including:  · Feeling sick to stomach (nausea).  · Fluid retention.  · Swollen breasts.  · Acne, when taking HT with progesterone.  · Unusual vaginal discharge and bleeding (if the uterus is present).  · Headaches.  · Some women think HT will make them gain weight. Research now shows this is not true. Some women do gain weight during menopause, but this  is because their metabolism slows down as they age. They also may not be increasing their amount or level of physical activity as they get older.  · Short-term benefits or side effects should become noticeable within days, weeks, or sometimes months after treatment begins.  LONG-TERM RISKS  These will not be easily noticeable for each individual woman. There are many factors involved that can contribute to long-term risks and side effects.  CANCER  There is concern that HT can increase the risk of some cancers, including endometrial cancer (lining of the uterus), breast, and certain (but not all) ovarian or cervix cancers, such as endometriod ovarian cancer.   When estrogen is taken alone, it raises the risk of endometrial cancer, if the uterus is still present. Adding progestin with estrogen (HT) can greatly reduce this risk. Progestin is added to prevent the overgrowth (hyperplasia) of cells in the uterine lining. Women who still have an intact uterus are generally given this combined therapy and should not take estrogen hormone alone without progesterone.  HT with estrogen and progestin has been linked to an increased risk of invasive breast cancer. Women who use estrogen plus progestin for four years or longer are more likely to develop breast cancer than women who have not used them for as long. This indicates that the therapy may have a cumulative effect.  The decision to take HT should be based on an overall look at the risk and benefits, and how they fit with your personal and genetic health profile.  Conditions that increase the underlying risk of developing breast cancer   include:  · Family history of breast cancer.  · Early age of the first menstrual period (menarche).  · Late age of child bearing.  · High fat diet.  · Late menopause.  · Obesity.  · Increased breast density on mammograms.  · Certain non-cancerous (benign) breast lesions.  · Excessive use of alcohol.  · Extensive radiation exposure to the  chest.  These factors need to be considered when deciding to take HT. If you are currently taking HT and have concerns, talk with your caregiver as soon as possible.   BREAST DENSITY  Taking both estrogen and progestin also can affect a woman's breast density. Increased breast density from HT makes it hard for a radiologist to read some special breast x-rays (mammograms). This leads to the need for follow-up mammograms, ultrasound or MRI (magnetic resonance imaging), or taking breast tissue samples that are surgically removed (biopsies). Increased density also is a concern because studies have shown that women age 45 and older, whose mammograms show at least 75 percent dense tissue, are at increased risk for breast cancer. However, it is not known if increased breast density due to HT carries the same risk for breast cancer as having naturally dense breasts.  About 25 percent of women who use combined HT have an increase in breast density on their mammograms. This is compared to about 8 percent of women taking estrogen alone. One study showed that stopping HT for about 2 weeks before having a mammogram improved the readability of the mammogram. But further research is needed to confirm the usefulness of this approach.  HEART DISEASE  In the past, taking HT (estrogen plus progestin) was thought to help protect women against heart disease. However, recent findings show that taking HT poses more risks than benefits. HT could increase a woman's risk for:  · Heart disease.  · Stroke.  · Blood clot in the lung (pulmonary embolism).  · Breast cancer.  · Blood clots in the legs.  Women who have gone through menopause should not be given HT to prevent heart disease and other chronic conditions.   Women who have gone through menopause and who have heart disease, may have a greater risk of another cardiac event (like heart attack) after starting HT, at least in the short-term. For women who have had strokes, their risk for  having another stroke goes up when they start taking HT. Hormones are not recommended for women with heart disease or for women who have had a stroke. If you have gone through menopause, talk with your caregiver about whether hormones are right for you. You can check the National Women's Health Information Center website (www.womenshealth.gov) for updates on postmenopausal hormone therapy.  OTHER RISKS INCLUDE:  · Developing high blood pressure.  · Developing gallbladder disease.  · Women with a fibroid non-cancerous tumor on the uterus may develop pain, bleeding or increase growth of the fibroid.  If you are taking HT, watch for signs of trouble. These include:  · Abnormal bleeding.  · Breast lumps, bloody discharge or red/painful breasts.  · Shortness of breath.  · Dizziness.  · Abdominal pain.  · Severe headaches.  · Pain in your calves or chest.  Report these signs to your caregiver right away. Also, talk with your caregiver about how often you should have an exam.  DOES THE DURATION OF TAKING HT AFFECT BREAST CANCER RISK?  The relationship between a woman's risk of developing breast cancer and the length of time that   she receives HT is not clear. Some women take HT for only a few years until the worst of their menopausal symptoms have passed. Others have taken it for 10 years or more. Some researchers believe that there is little or no increased risk of breast cancer associated with short-term use of either HT with estrogen alone or estrogen combined with progestin. But long-term use is linked to an increased risk. Women on HT should continue to do monthly self breast exams and get their mammograms as recommended by their caregiver.  WHY IS MENOPAUSAL HORMONE THERAPY USED IN SPITE OF THE CANCER RISK?  The known benefits of HT can improve the quality of life for many women, by reducing uncomfortable symptoms, as mentioned above. There also is evidence that HT helps prevent and treats osteoporosis. There is  preliminary evidence that it can help prevent other problems associated with age, including colon cancer. The addition of progestin to the treatment has greatly reduced the risk of uterine cancer.  ARE THERE OTHER DRUG THERAPIES KNOWN TO TREAT CONDITIONS RELATED TO MENOPAUSE?  A class of antidepressant drugs called Selective Serotonin Reuptake Inhibitors (SSRIs) are effective in treating menopause-related symptoms of depression or mood changes. Vitamin E and Clonidine (drug typically used for high blood pressure) can help reduce hot flashes. To prevent osteoporosis, women who are at high risk for bone loss may be given drugs such as bisphosphonates, alendronate, raloxifene, calcium with vitamin D, calcitonin, and prescription medicines such as fasomax or boneva. Lastly, a class of cholesterol-lowering drugs called HMG-CoA-reductase inhibitors (statins) are proven to be effective for reducing risk of heart disease. They are also being explored to prevent osteoporosis. No alternatives to estrogen exist for prevention of colon cancer - a disease for which early evidence suggests HT may be beneficial.  WHO SHOULD NOT USE HT?   HT is often not recommended for women who have any of the following conditions:  · Vaginal bleeding of an unknown cause.  · Suspected breast cancer or history of breast cancer.  · History of endometrial or uterine cancer.  · Chronic disease of the liver.  · History of heart disease.  · History of blood clots in the veins or legs or in the lung (venous thrombosis). This includes women who have had thrombosis or blood clots during pregnancy or when taking birth control pills. Although the risk of blood clots in women is very low, HT increases the risk.  · Severe or uncontrolled high blood pressure.  · Anyone who may be pregnant.  HOW CAN I SORT THROUGH THE BENEFITS AND RISKS TO MAKE A GOOD DECISION ABOUT WHETHER OR NOT TO USE POSTMENOPAUSAL HORMONE THERAPY?  Here are several helpful points,  summarizing the findings of the Women's Health Initiative (WHI) study:   First, it is important to know that because the study involved healthy women, only a small number of them had either a negative or positive effect from estrogen plus progestin therapy. The percentages describe what would happen to a whole population, not necessarily to any individual woman.  Second, remember that percentages are not fate. Whether expressing risks or benefits, a percentage does not mean you will develop a disease. Many factors affect that likelihood, including:  · Your lifestyle.  · Environmental factors.  · Heredity.  · Your personal medical history.  Realize that most treatments carry risks and benefits. No one can make a treatment choice for you. Talk with your caregiver and decide what is best for your health and   quality of life. Begin by finding out your family history and your personal risk profile for:  · Heart disease.  · Stroke.  · Breast cancer.  · Osteoporosis.  · Colorectal cancer.  · Blood clots.  · Other medical conditions.  Document Released: 10/30/2002 Document Revised: 04/25/2011 Document Reviewed: 12/01/2008  [employer]® Patient Information ©2013 [employer].

## 2012-06-20 NOTE — Progress Notes (Signed)
Patient ID: Jean Luna, female   DOB: [birth year], 57 y.o.   MRN: [id number]  Subjective  57 year old [id number] Caucasian female with LMP in 2005, who present with possible cervical polyp. Sees Dr. Trenton Founds at [facility] for exams.  When had her visit, provider felt a polyp.  Had a normal pap smear.  Unsure if had HPV testing.    History of abnormal pap smears.  Last abnormal pap was in 1986.  Had a LEEP procedure.  Follow up paps have been normal.    States a history of fibroids years ago.    LMP 9 years ago.  Patient is on Vivelle patch changing twice a week and daily prometrium.    Normal cholesterol and blood sugar levels.  Family history of heart disease on maternal and paternal sides of family.  Objective  Abdomen - soft, nontender, nondistended.  No hepatosplenomegaly or organomegaly.  Pelvic - normal external genitalia and urethra.  Cervix consistent with prior LEEP procedure.  Columnar mucosa from the endocervix visible and without lesions or polyp.  Normal uterus, nontender.  Mild left adnexal tenderness but no masses.  No right adnexal mass or tenderness.  Pap normal and HR HPV negative per Epic notes from PCP.  Assessment  Normal pelvic exam. No evidence of polyp. History of abnormal pap and LEEP procedure. Menopausal symptoms.  Plan  I discussed with patient the risks and benefits of HRT.  Patient wants to continue.   Patient will continue with well woman care visits yearly. I have told her the current pap guidelines will allow her to have her next pap smear in 5 years.

## 2012-06-21 ENCOUNTER — Other Ambulatory Visit (INDEPENDENT_AMBULATORY_CARE_PROVIDER_SITE_OTHER): Payer: BC Managed Care – PPO

## 2012-06-21 DIAGNOSIS — R5383 Other fatigue: Secondary | ICD-10-CM

## 2012-06-21 DIAGNOSIS — E559 Vitamin D deficiency, unspecified: Secondary | ICD-10-CM

## 2012-06-21 DIAGNOSIS — R5381 Other malaise: Secondary | ICD-10-CM

## 2012-06-21 DIAGNOSIS — E785 Hyperlipidemia, unspecified: Secondary | ICD-10-CM

## 2012-06-21 LAB — CBC WITH DIFFERENTIAL/PLATELET
Eosinophils Absolute: 0.2 10*3/uL (ref 0.0–0.7)
Lymphs Abs: 2.7 10*3/uL (ref 0.7–4.0)
MCHC: 34.3 g/dL (ref 30.0–36.0)
MCV: 90.8 fl (ref 78.0–100.0)
Monocytes Absolute: 0.5 10*3/uL (ref 0.1–1.0)
Neutrophils Relative %: 53.3 % (ref 43.0–77.0)
Platelets: 343 10*3/uL (ref 150.0–400.0)
RDW: 12.8 % (ref 11.5–14.6)

## 2012-06-21 LAB — LIPID PANEL
Cholesterol: 172 mg/dL (ref 0–200)
HDL: 42.9 mg/dL (ref >39.00)
LDL Cholesterol: 102 mg/dL — ABNORMAL HIGH (ref 0–99)
Triglycerides: 135 mg/dL (ref 0.0–149.0)
VLDL: 27 mg/dL (ref 0.0–40.0)

## 2012-06-21 LAB — COMPREHENSIVE METABOLIC PANEL
Alkaline Phosphatase: 64 U/L (ref 39–117)
BUN: 11 mg/dL (ref 6–23)
Glucose, Bld: 92 mg/dL (ref 70–99)
Sodium: 140 mEq/L (ref 135–145)
Total Bilirubin: 0.8 mg/dL (ref 0.3–1.2)
Total Protein: 6.3 g/dL (ref 6.0–8.3)

## 2012-06-21 LAB — TSH: TSH: 0.37 u[IU]/mL (ref 0.35–5.50)

## 2012-07-27 ENCOUNTER — Encounter: Payer: Self-pay | Admitting: Emergency Medicine

## 2012-07-30 ENCOUNTER — Other Ambulatory Visit: Payer: Self-pay | Admitting: *Deleted

## 2012-07-30 DIAGNOSIS — Z780 Asymptomatic menopausal state: Secondary | ICD-10-CM

## 2012-07-31 MED ORDER — PROGESTERONE MICRONIZED 100 MG PO CAPS: 100.0000 mg | ORAL_CAPSULE | Freq: Every day | ORAL | Status: DC

## 2012-09-10 ENCOUNTER — Other Ambulatory Visit: Payer: Self-pay | Admitting: *Deleted

## 2012-09-10 DIAGNOSIS — Z780 Asymptomatic menopausal state: Secondary | ICD-10-CM

## 2012-09-10 MED ORDER — ESTRADIOL 0.025 MG/24HR TD PTTW: 1.0000 | MEDICATED_PATCH | TRANSDERMAL | Status: DC

## 2012-09-28 ENCOUNTER — Ambulatory Visit (INDEPENDENT_AMBULATORY_CARE_PROVIDER_SITE_OTHER): Payer: BC Managed Care – PPO | Admitting: Adult Health

## 2012-09-28 ENCOUNTER — Encounter: Payer: Self-pay | Admitting: Adult Health

## 2012-09-28 VITALS — BP 122/80 | HR 80 | Temp 98.2°F | Wt 149.0 lb

## 2012-09-28 DIAGNOSIS — B379 Candidiasis, unspecified: Secondary | ICD-10-CM | POA: Insufficient documentation

## 2012-09-28 MED ORDER — FLUCONAZOLE 150 MG PO TABS: 150.0000 mg | ORAL_TABLET | Freq: Once | ORAL | Status: DC

## 2012-09-28 NOTE — Assessment & Plan Note (Signed)
Diflucan x 1. May repeat in 72 hours if necessary.

## 2012-09-28 NOTE — Progress Notes (Signed)
  Subjective:    Patient ID: Jean Luna, female    DOB: September 24, 1955, 57 y.o.   MRN: [id number]  HPI  Patient presents to clinic s/p course of antibiotic and now with symptoms of yeast. Symptoms of itching, burning. She has used vagisil but symptoms ongoing.  Current Outpatient Prescriptions on File Prior to Visit  Medication Sig Dispense Refill  . Azelastine-Fluticasone (DYMISTA) 137-50 MCG/ACT SUSP Place 1 spray into both nostrils daily.      . Calcium Carbonate (CALTRATE 600 PO) Take 1 tablet by mouth daily.      Marland Kitchen EPIPEN 2-PAK 0.3 MG/0.3ML SOAJ       . estradiol (VIVELLE-DOT) 0.025 MG/24HR Place 1 patch onto the skin 2 (two) times a week.  8 patch  11  . fluticasone-salmeterol (ADVAIR HFA) 115-21 MCG/ACT inhaler Inhale 2 puffs into the lungs daily.        . magnesium 30 MG tablet Take 30 mg by mouth daily.      . Multiple Vitamin (MULTIVITAMIN) capsule Take 1 capsule by mouth daily.      . progesterone (PROMETRIUM) 100 MG capsule Take 1 capsule (100 mg total) by mouth daily.  30 capsule  5  . telmisartan (MICARDIS) 40 MG tablet Take 1 tablet (40 mg total) by mouth daily.  90 tablet  2  . zileuton (ZYFLO) 600 MG TABS Take 600 mg by mouth daily.        . cetirizine (ZYRTEC) 10 MG tablet Take 10 mg by mouth daily.      . predniSONE (STERAPRED UNI-PAK) 10 MG tablet Take 10 mg by mouth daily.       No current facility-administered medications on file prior to visit.    Review of Systems  Genitourinary:       Itching, burning external genitalia.     BP 122/80  Pulse 80  Temp(Src) 98.2 F (36.8 C) (Oral)  Wt 149 lb (67.586 kg)  BMI 25.98 kg/m2  SpO2 97%  LMP 02/15/2003     Objective:   Physical Exam  Constitutional: She is oriented to person, place, and time.  Cardiovascular: Normal rate and regular rhythm.   Pulmonary/Chest: Effort normal. No respiratory distress.  Genitourinary:  Symptoms consistent with yeast after antibiotics  Neurological: She is alert and  oriented to person, place, and time.  Skin: Skin is warm and dry.  Psychiatric: She has a normal mood and affect. Her behavior is normal. Thought content normal.          Assessment & Plan:

## 2012-09-28 NOTE — Patient Instructions (Addendum)
Candida Infection, Adult A candida infection (also called yeast, fungus and Monilia infection) is an overgrowth of yeast that can occur anywhere on the body. A yeast infection commonly occurs in warm, moist body areas. Usually, the infection remains localized but can spread to become a systemic infection. A yeast infection may be a sign of a more severe disease such as diabetes, leukemia, or AIDS. A yeast infection can occur in both men and women. In women, Candida vaginitis is a vaginal infection. It is one of the most common causes of vaginitis. Men usually do not have symptoms or know they have an infection until other problems develop. Men may find out they have a yeast infection because their sex partner has a yeast infection. Uncircumcised men are more likely to get a yeast infection than circumcised men. This is because the uncircumcised glans is not exposed to air and does not remain as dry as that of a circumcised glans. Older adults may develop yeast infections around dentures. CAUSES  Women  Antibiotics.  Steroid medication taken for a long time.  Being overweight (obese).  Diabetes.  Poor immune condition.  Certain serious medical conditions.  Immune suppressive medications for organ transplant patients.  Chemotherapy.  Pregnancy.  Menstration.  Stress and fatigue.  Intravenous drug use.  Oral contraceptives.  Wearing tight-fitting clothes in the crotch area.  Catching it from a sex partner who has a yeast infection.  Spermicide.  Intravenous, urinary, or other catheters. Men  Catching it from a sex partner who has a yeast infection.  Having oral or anal sex with a person who has the infection.  Spermicide.  Diabetes.  Antibiotics.  Poor immune system.  Medications that suppress the immune system.  Intravenous drug use.  Intravenous, urinary, or other catheters. SYMPTOMS  Women  Thick, white vaginal discharge.  Vaginal itching.  Redness and  swelling in and around the vagina.  Irritation of the lips of the vagina and perineum.  Blisters on the vaginal lips and perineum.  Painful sexual intercourse.  Low blood sugar (hypoglycemia).  Painful urination.  Bladder infections.  Intestinal problems such as constipation, indigestion, bad breath, bloating, increase in gas, diarrhea, or loose stools. Men  Men may develop intestinal problems such as constipation, indigestion, bad breath, bloating, increase in gas, diarrhea, or loose stools.  Dry, cracked skin on the penis with itching or discomfort.  Jock itch.  Dry, flaky skin.  Athlete's foot.  Hypoglycemia. DIAGNOSIS  Women  A history and an exam are performed.  The discharge may be examined under a microscope.  A culture may be taken of the discharge. Men  A history and an exam are performed.  Any discharge from the penis or areas of cracked skin will be looked at under the microscope and cultured.  Stool samples may be cultured. TREATMENT  Women  Vaginal antifungal suppositories and creams.  Medicated creams to decrease irritation and itching on the outside of the vagina.  Warm compresses to the perineal area to decrease swelling and discomfort.  Oral antifungal medications.  Medicated vaginal suppositories or cream for repeated or recurrent infections.  Wash and dry the irritation areas before applying the cream.  Eating yogurt with lactobacillus may help with prevention and treatment.  Sometimes painting the vagina with gentian violet solution may help if creams and suppositories do not work. Men  Antifungal creams and oral antifungal medications.  Sometimes treatment must continue for 30 days after the symptoms go away to prevent recurrence. HOME CARE   INSTRUCTIONS  Women  Use cotton underwear and avoid tight-fitting clothing.  Avoid colored, scented toilet paper and deodorant tampons or pads.  Do not douche.  Keep your diabetes  under control.  Finish all the prescribed medications.  Keep your skin clean and dry.  Consume milk or yogurt with lactobacillus active culture regularly. If you get frequent yeast infections and think that is what the infection is, there are over-the-counter medications that you can get. If the infection does not show healing in 3 days, talk to your caregiver.  Tell your sex partner you have a yeast infection. Your partner may need treatment also, especially if your infection does not clear up or recurs. Men  Keep your skin clean and dry.  Keep your diabetes under control.  Finish all prescribed medications.  Tell your sex partner that you have a yeast infection so they can be treated if necessary. SEEK MEDICAL CARE IF:   Your symptoms do not clear up or worsen in one week after treatment.  You have an oral temperature above 102 F (38.9 C).  You have trouble swallowing or eating for a prolonged time.  You develop blisters on and around your vagina.  You develop vaginal bleeding and it is not your menstrual period.  You develop abdominal pain.  You develop intestinal problems as mentioned above.  You get weak or lightheaded.  You have painful or increased urination.  You have pain during sexual intercourse. MAKE SURE YOU:   Understand these instructions.  Will watch your condition.  Will get help right away if you are not doing well or get worse. Document Released: 03/10/2004 Document Revised: 04/25/2011 Document Reviewed: 06/22/2009 [facility] Patient Information 2014 [place], [place]. Candida Infection, Adult A candida infection (also called yeast, fungus and Monilia infection) is an overgrowth of yeast that can occur anywhere on the body. A yeast infection commonly occurs in warm, moist body areas. Usually, the infection remains localized but can spread to become a systemic infection. A yeast infection may be a sign of a more severe disease such as diabetes,  leukemia, or AIDS. A yeast infection can occur in both men and women. In women, Candida vaginitis is a vaginal infection. It is one of the most common causes of vaginitis. Men usually do not have symptoms or know they have an infection until other problems develop. Men may find out they have a yeast infection because their sex partner has a yeast infection. Uncircumcised men are more likely to get a yeast infection than circumcised men. This is because the uncircumcised glans is not exposed to air and does not remain as dry as that of a circumcised glans. Older adults may develop yeast infections around dentures. CAUSES  Women  Antibiotics.  Steroid medication taken for a long time.  Being overweight (obese).  Diabetes.  Poor immune condition.  Certain serious medical conditions.  Immune suppressive medications for organ transplant patients.  Chemotherapy.  Pregnancy.  Menstration.  Stress and fatigue.  Intravenous drug use.  Oral contraceptives.  Wearing tight-fitting clothes in the crotch area.  Catching it from a sex partner who has a yeast infection.  Spermicide.  Intravenous, urinary, or other catheters. Men  Catching it from a sex partner who has a yeast infection.  Having oral or anal sex with a person who has the infection.  Spermicide.  Diabetes.  Antibiotics.  Poor immune system.  Medications that suppress the immune system.  Intravenous drug use.  Intravenous, urinary, or other catheters. SYMPTOMS  Women  Thick, white vaginal discharge.  Vaginal itching.  Redness and swelling in and around the vagina.  Irritation of the lips of the vagina and perineum.  Blisters on the vaginal lips and perineum.  Painful sexual intercourse.  Low blood sugar (hypoglycemia).  Painful urination.  Bladder infections.  Intestinal problems such as constipation, indigestion, bad breath, bloating, increase in gas, diarrhea, or loose stools. Men  Men  may develop intestinal problems such as constipation, indigestion, bad breath, bloating, increase in gas, diarrhea, or loose stools.  Dry, cracked skin on the penis with itching or discomfort.  Jock itch.  Dry, flaky skin.  Athlete's foot.  Hypoglycemia. DIAGNOSIS  Women  A history and an exam are performed.  The discharge may be examined under a microscope.  A culture may be taken of the discharge. Men  A history and an exam are performed.  Any discharge from the penis or areas of cracked skin will be looked at under the microscope and cultured.  Stool samples may be cultured. TREATMENT  Women  Vaginal antifungal suppositories and creams.  Medicated creams to decrease irritation and itching on the outside of the vagina.  Warm compresses to the perineal area to decrease swelling and discomfort.  Oral antifungal medications.  Medicated vaginal suppositories or cream for repeated or recurrent infections.  Wash and dry the irritation areas before applying the cream.  Eating yogurt with lactobacillus may help with prevention and treatment.  Sometimes painting the vagina with gentian violet solution may help if creams and suppositories do not work. Men  Antifungal creams and oral antifungal medications.  Sometimes treatment must continue for 30 days after the symptoms go away to prevent recurrence. HOME CARE INSTRUCTIONS  Women  Use cotton underwear and avoid tight-fitting clothing.  Avoid colored, scented toilet paper and deodorant tampons or pads.  Do not douche.  Keep your diabetes under control.  Finish all the prescribed medications.  Keep your skin clean and dry.  Consume milk or yogurt with lactobacillus active culture regularly. If you get frequent yeast infections and think that is what the infection is, there are over-the-counter medications that you can get. If the infection does not show healing in 3 days, talk to your caregiver.  Tell your sex  partner you have a yeast infection. Your partner may need treatment also, especially if your infection does not clear up or recurs. Men  Keep your skin clean and dry.  Keep your diabetes under control.  Finish all prescribed medications.  Tell your sex partner that you have a yeast infection so they can be treated if necessary. SEEK MEDICAL CARE IF:   Your symptoms do not clear up or worsen in one week after treatment.  You have an oral temperature above 102 F (38.9 C).  You have trouble swallowing or eating for a prolonged time.  You develop blisters on and around your vagina.  You develop vaginal bleeding and it is not your menstrual period.  You develop abdominal pain.  You develop intestinal problems as mentioned above.  You get weak or lightheaded.  You have painful or increased urination.  You have pain during sexual intercourse. MAKE SURE YOU:   Understand these instructions.  Will watch your condition.  Will get help right away if you are not doing well or get worse. Document Released: 03/10/2004 Document Revised: 04/25/2011 Document Reviewed: 06/22/2009 [facility] Patient Information 2014 [place], [place].

## 2012-10-18 ENCOUNTER — Other Ambulatory Visit: Payer: Self-pay | Admitting: *Deleted

## 2012-10-18 DIAGNOSIS — Z780 Asymptomatic menopausal state: Secondary | ICD-10-CM

## 2012-10-18 MED ORDER — ESTRADIOL 0.025 MG/24HR TD PTTW: 1.0000 | MEDICATED_PATCH | TRANSDERMAL | Status: DC

## 2012-11-08 ENCOUNTER — Ambulatory Visit: Payer: Self-pay | Admitting: Unknown Physician Specialty

## 2012-11-14 HISTORY — PX: CERVICAL DISC SURGERY: SHX588

## 2012-12-20 ENCOUNTER — Other Ambulatory Visit: Payer: Self-pay

## 2013-01-29 ENCOUNTER — Encounter: Payer: Self-pay | Admitting: Family Medicine

## 2013-01-29 ENCOUNTER — Ambulatory Visit (INDEPENDENT_AMBULATORY_CARE_PROVIDER_SITE_OTHER): Payer: BC Managed Care – PPO | Admitting: Family Medicine

## 2013-01-29 ENCOUNTER — Telehealth: Payer: Self-pay | Admitting: Internal Medicine

## 2013-01-29 VITALS — BP 130/80 | HR 77 | Temp 99.2°F | Wt 150.0 lb

## 2013-01-29 DIAGNOSIS — N390 Urinary tract infection, site not specified: Secondary | ICD-10-CM

## 2013-01-29 DIAGNOSIS — R350 Frequency of micturition: Secondary | ICD-10-CM

## 2013-01-29 LAB — POCT URINALYSIS DIPSTICK
Bilirubin, UA: NEGATIVE
Glucose, UA: NEGATIVE
Nitrite, UA: NEGATIVE

## 2013-01-29 MED ORDER — CEPHALEXIN 500 MG PO CAPS: 500.0000 mg | ORAL_CAPSULE | Freq: Two times a day (BID) | ORAL | Status: DC

## 2013-01-29 NOTE — Telephone Encounter (Signed)
Patient Information:  Caller Name: Magdelena  Phone: [phone]  Patient: Jean Luna, Jean Luna  Gender: Female  DOB: Nov 03, 1955  Age: 57 Years  PCP: Duncan Dull (Adults only)  Office Follow Up:  Does the office need to follow up with this patient?: No  Instructions For The Office: N/A   Symptoms  Reason For Call & Symptoms: Started with some burning and frequency, urgency onset 01/29/13. She has some pink tinge with wiping. She has increase  Reviewed Health History In EMR: Yes  Reviewed Medications In EMR: Yes  Reviewed Allergies In EMR: Yes  Reviewed Surgeries / Procedures: Yes  Date of Onset of Symptoms: 01/28/2013  Treatments Tried: Amoxicillin- 2 doses so far  Treatments Tried Worked: Yes- helped some  Guideline(s) Used:  Urination Pain - Female  Disposition Per Guideline:   See Today in Office  Reason For Disposition Reached:   Age > 50 years  Advice Given:  Fluids:   Drink extra fluids. Drink 8-10 glasses of liquids a day (Reason: to produce a dilute, non-irritating urine).  Cranberry Juice:   Some people think that drinking cranberry juice may help in fighting urinary tract infections. However, there is no good research that has ever proved this.  Dosage 100% Cranberry Juice: 1 oz (30 ml) twice a day.  Warm Saline SITZ Baths to Reduce Pain:  Sit in a warm saline bath for 20 minutes to cleanse the area and to reduce pain. Add 2 oz. of table salt or baking soda to a tub of water.  Call Back If:  You become worse.  Fluids:   Drink extra fluids. Drink 8-10 glasses of liquids a day. (Reason: to produce a dilute, non-irritating urine)  Call Back If:   Fever lasts more than 24 hours on antibiotics  Pain does not improve by day 3 on antibiotics  Urine symptoms do not improve by day 3 on antibiotics  You become worse.  Patient Will Follow Care Advice:  YES  Appointment Scheduled:  01/29/2013 14:00:00 Appointment Scheduled Provider:  Terrilee Files- DO at [facility]

## 2013-01-29 NOTE — Patient Instructions (Signed)
Very nice to meet you Try keflex 2 times daily for next week If fever or chills or worsening back pain come back Happy Holidays! Urinary Tract Infection Urinary tract infections (UTIs) can develop anywhere along your urinary tract. Your urinary tract is your body's drainage system for removing wastes and extra water. Your urinary tract includes two kidneys, two ureters, a bladder, and a urethra. Your kidneys are a pair of bean-shaped organs. Each kidney is about the size of your fist. They are located below your ribs, one on each side of your spine. CAUSES Infections are caused by microbes, which are microscopic organisms, including fungi, viruses, and bacteria. These organisms are so small that they can only be seen through a microscope. Bacteria are the microbes that most commonly cause UTIs. SYMPTOMS  Symptoms of UTIs may vary by age and gender of the patient and by the location of the infection. Symptoms in young women typically include a frequent and intense urge to urinate and a painful, burning feeling in the bladder or urethra during urination. Older women and men are more likely to be tired, shaky, and weak and have muscle aches and abdominal pain. A fever may mean the infection is in your kidneys. Other symptoms of a kidney infection include pain in your back or sides below the ribs, nausea, and vomiting. DIAGNOSIS To diagnose a UTI, your caregiver will ask you about your symptoms. Your caregiver also will ask to provide a urine sample. The urine sample will be tested for bacteria and white blood cells. White blood cells are made by your body to help fight infection. TREATMENT  Typically, UTIs can be treated with medication. Because most UTIs are caused by a bacterial infection, they usually can be treated with the use of antibiotics. The choice of antibiotic and length of treatment depend on your symptoms and the type of bacteria causing your infection. HOME CARE INSTRUCTIONS  If you were  prescribed antibiotics, take them exactly as your caregiver instructs you. Finish the medication even if you feel better after you have only taken some of the medication.  Drink enough water and fluids to keep your urine clear or pale yellow.  Avoid caffeine, tea, and carbonated beverages. They tend to irritate your bladder.  Empty your bladder often. Avoid holding urine for long periods of time.  Empty your bladder before and after sexual intercourse.  After a bowel movement, women should cleanse from front to back. Use each tissue only once. SEEK MEDICAL CARE IF:   You have back pain.  You develop a fever.  Your symptoms do not begin to resolve within 3 days. SEEK IMMEDIATE MEDICAL CARE IF:   You have severe back pain or lower abdominal pain.  You develop chills.  You have nausea or vomiting.  You have continued burning or discomfort with urination. MAKE SURE YOU:   Understand these instructions.  Will watch your condition.  Will get help right away if you are not doing well or get worse. Document Released: 11/10/2004 Document Revised: 08/02/2011 Document Reviewed: 03/11/2011 [facility] Patient Information 2014 [place], [place].

## 2013-01-29 NOTE — Progress Notes (Signed)
SUBJECTIVE: Jean Luna is a 57 y.o. female who complains of urinary frequency, urgency and dysuria x 3 days, without flank pain, fever, chills, or abnormal vaginal discharge or bleeding. No history of regular UTI's Past Medical History  Diagnosis Date  . Hypertension   . Asthma   . Migraine, menstrual     resolved with menopause  . Raynaud phenomenon   . Facial paralysis/Bells palsy     6 week paralysis of right side of face   . Fibroid   . Cervical disc herniation    Past Surgical History  Procedure Laterality Date  . Nasal sinus surgery    . Cervical cone biopsy  1985  . Cervical biopsy  w/ loop electrode excision  1991   Family History  Problem Relation Age of Onset  . Celiac disease Daughter   . Hypertension Mother   . Stroke Mother 5    cerebral aneurysm  . Heart disease Father   . COPD Father 34   Blood pressure 130/80, pulse 77, temperature 99.2 F (37.3 C), temperature source Oral, weight 150 lb (68.04 kg), last menstrual period 02/15/2003, SpO2 97.00%.   OBJECTIVE: Appears well, in no apparent distress.  Vital signs are normal. The abdomen is soft without tenderness, guarding, mass, rebound or organomegaly. No CVA tenderness or inguinal adenopathy noted. Urine dipstick shows positive for WBC's, positive for RBC's and positive for leukocytes.  Micro exam: not done.   ASSESSMENT: UTI uncomplicated without evidence of pyelonephritis  PLAN: Treatment per orders - also push fluids, may use Pyridium OTC prn. Call or return to clinic prn if these symptoms worsen or fail to improve as anticipated.

## 2013-01-29 NOTE — Progress Notes (Signed)
Pre-visit discussion using our clinic review tool. No additional management support is needed unless otherwise documented below in the visit note.  

## 2013-01-29 NOTE — Telephone Encounter (Signed)
FYI-pt to be seen at [facility] office today

## 2013-02-26 LAB — PULMONARY FUNCTION TEST

## 2013-03-11 ENCOUNTER — Telehealth: Payer: Self-pay | Admitting: Internal Medicine

## 2013-03-11 DIAGNOSIS — J449 Chronic obstructive pulmonary disease, unspecified: Secondary | ICD-10-CM

## 2013-03-11 NOTE — Assessment & Plan Note (Signed)
Spirometry with flow volume loops were done recently at [facility] and were demonstrating mild airway obstruction with no evidence of restrictive airway disease.

## 2013-03-25 ENCOUNTER — Encounter: Payer: Self-pay | Admitting: Internal Medicine

## 2013-05-29 ENCOUNTER — Ambulatory Visit: Payer: Self-pay | Admitting: Internal Medicine

## 2013-06-13 ENCOUNTER — Encounter: Payer: Self-pay | Admitting: Internal Medicine

## 2013-06-13 ENCOUNTER — Ambulatory Visit (INDEPENDENT_AMBULATORY_CARE_PROVIDER_SITE_OTHER): Payer: BC Managed Care – PPO | Admitting: Internal Medicine

## 2013-06-13 VITALS — BP 124/76 | HR 71 | Temp 98.2°F | Resp 16 | Ht 63.5 in | Wt 147.5 lb

## 2013-06-13 DIAGNOSIS — E559 Vitamin D deficiency, unspecified: Secondary | ICD-10-CM

## 2013-06-13 DIAGNOSIS — N390 Urinary tract infection, site not specified: Secondary | ICD-10-CM

## 2013-06-13 DIAGNOSIS — R5383 Other fatigue: Secondary | ICD-10-CM

## 2013-06-13 DIAGNOSIS — Z0000 Encounter for general adult medical examination without abnormal findings: Secondary | ICD-10-CM

## 2013-06-13 DIAGNOSIS — R5381 Other malaise: Secondary | ICD-10-CM

## 2013-06-13 DIAGNOSIS — I10 Essential (primary) hypertension: Secondary | ICD-10-CM

## 2013-06-13 DIAGNOSIS — R0609 Other forms of dyspnea: Secondary | ICD-10-CM

## 2013-06-13 DIAGNOSIS — Z1239 Encounter for other screening for malignant neoplasm of breast: Secondary | ICD-10-CM

## 2013-06-13 DIAGNOSIS — E785 Hyperlipidemia, unspecified: Secondary | ICD-10-CM

## 2013-06-13 DIAGNOSIS — R0683 Snoring: Secondary | ICD-10-CM

## 2013-06-13 DIAGNOSIS — R0989 Other specified symptoms and signs involving the circulatory and respiratory systems: Secondary | ICD-10-CM

## 2013-06-13 DIAGNOSIS — G4710 Hypersomnia, unspecified: Secondary | ICD-10-CM

## 2013-06-13 LAB — URINALYSIS, ROUTINE W REFLEX MICROSCOPIC
Bilirubin Urine: NEGATIVE
Ketones, ur: NEGATIVE
Leukocytes, UA: NEGATIVE
Nitrite: NEGATIVE
Specific Gravity, Urine: <=1.005 — AB (ref 1.000–1.030)
Total Protein, Urine: NEGATIVE
URINE GLUCOSE: NEGATIVE
Urobilinogen, UA: 0.2 (ref 0.0–1.0)
pH: 7 (ref 5.0–8.0)

## 2013-06-13 LAB — CBC WITH DIFFERENTIAL/PLATELET
BASOS ABS: 0 10*3/uL (ref 0.0–0.1)
BASOS PCT: 0.7 % (ref 0.0–3.0)
EOS ABS: 0.2 10*3/uL (ref 0.0–0.7)
Eosinophils Relative: 4.9 % (ref 0.0–5.0)
HEMATOCRIT: 43.9 % (ref 36.0–46.0)
HEMOGLOBIN: 15 g/dL (ref 12.0–15.0)
LYMPHS ABS: 2 10*3/uL (ref 0.7–4.0)
Lymphocytes Relative: 40.8 % (ref 12.0–46.0)
MCHC: 34.2 g/dL (ref 30.0–36.0)
MCV: 90.7 fl (ref 78.0–100.0)
Monocytes Absolute: 0.3 10*3/uL (ref 0.1–1.0)
Monocytes Relative: 5.3 % (ref 3.0–12.0)
Neutro Abs: 2.3 10*3/uL (ref 1.4–7.7)
Neutrophils Relative %: 48.3 % (ref 43.0–77.0)
Platelets: 317 10*3/uL (ref 150.0–400.0)
RBC: 4.84 Mil/uL (ref 3.87–5.11)
RDW: 13.8 % (ref 11.5–14.6)
WBC: 4.8 10*3/uL (ref 4.5–10.5)

## 2013-06-13 LAB — COMPREHENSIVE METABOLIC PANEL
ALBUMIN: 4.3 g/dL (ref 3.5–5.2)
ALT: 30 U/L (ref 0–35)
AST: 29 U/L (ref 0–37)
Alkaline Phosphatase: 88 U/L (ref 39–117)
BILIRUBIN TOTAL: 0.7 mg/dL (ref 0.3–1.2)
BUN: 11 mg/dL (ref 6–23)
CO2: 28 mEq/L (ref 19–32)
Calcium: 9.2 mg/dL (ref 8.4–10.5)
Chloride: 104 mEq/L (ref 96–112)
Creatinine, Ser: 0.6 mg/dL (ref 0.4–1.2)
GFR: 104.72 mL/min (ref >60.00)
Glucose, Bld: 74 mg/dL (ref 70–99)
Potassium: 3.9 mEq/L (ref 3.5–5.1)
SODIUM: 139 meq/L (ref 135–145)
TOTAL PROTEIN: 6.9 g/dL (ref 6.0–8.3)

## 2013-06-13 LAB — LIPID PANEL
CHOLESTEROL: 197 mg/dL (ref 0–200)
HDL: 49.1 mg/dL (ref >39.00)
LDL CALC: 120 mg/dL — AB (ref 0–99)
TRIGLYCERIDES: 142 mg/dL (ref 0.0–149.0)
Total CHOL/HDL Ratio: 4
VLDL: 28.4 mg/dL (ref 0.0–40.0)

## 2013-06-13 LAB — VITAMIN B12: Vitamin B-12: 161 pg/mL — ABNORMAL LOW (ref 211–911)

## 2013-06-13 LAB — TSH: TSH: 1.06 u[IU]/mL (ref 0.35–5.50)

## 2013-06-13 NOTE — Patient Instructions (Signed)
Your arr doing well!  We are scheduling you for a sleep study    [employer] makes a pita bread and a flat bread that are 50 cal and 4 net carbs available at [place] and [place].  This can be toasted to use with  hummous as well  [employer] makes a low carb flatbread that's 100 cal and 9 net carbs available at [employer] and [employer] makes 2 sizes of  Low carb whole wheat tortilla  (The large one is 210 cal and 6 net carbs)    There is a low carb pasta by [employer] that is acceptable and tastes great: only 5 digestible carbs/serving.( All grocery stores but [employer] carry it )

## 2013-06-13 NOTE — Progress Notes (Signed)
Patient ID: Jean Luna, female   DOB: 01-10-1956, 58 y.o.   MRN: [id number]   Subjective:     Jean Luna is a 58 y.o. female and is here for a comprehensive physical exam. The patient reports problems - as follows:   Has been treated 2 times in the past 4 months for UTI ,  One occurred after a GI bug.  But no cultures were done ,  The second one was in February  Self treated with keflex/amox and symptoms resolved.   Snoring is worse despite ENT surgery.  Sleeping with an elevated head of bed because she breathes better.  Still  Not resting well ,  Wakes up tired, tired all day, no  Appetite,   October had cervical  Disk fusion at [place]  with improved radiculopathy to the right hand ,  Still having  some burning in the trapezius.    History   Social History  . Marital Status: Married    Spouse Name: N/A    Number of Children: N/A  . Years of Education: N/A   Occupational History  . Not on file.   Social History Main Topics  . Smoking status: Never Smoker   . Smokeless tobacco: Never Used  . Alcohol Use: No  . Drug Use: No  . Sexual Activity: Yes    Birth Control/ Protection: Post-menopausal   Other Topics Concern  . Not on file   Social History Narrative  . No narrative on file   Health Maintenance  Topic Date Due  . Tetanus/tdap  06/12/1973  . Influenza Vaccine  09/14/2013  . Mammogram  04/13/2014  . Pap Smear  06/13/2015  . Colonoscopy  01/24/2023    The following portions of the patient's history were reviewed and updated as appropriate: allergies, current medications, past family history, past medical history, past social history, past surgical history and problem list.  Review of Systems A comprehensive review of systems was negative.   Objective:  BP 124/76  Pulse 71  Temp(Src) 98.2 F (36.8 C) (Oral)  Resp 16  Ht 5' 3.5" (1.613 m)  Wt 147 lb 8 oz (66.906 kg)  BMI 25.72 kg/m2  SpO2 99%  LMP  02/15/2003   General appearance: alert, cooperative and appears stated age Head: Normocephalic, without obvious abnormality, atraumatic Eyes: conjunctivae/corneas clear. PERRL, EOM's intact. Fundi benign. Ears: normal TM's and external ear canals both ears Nose: Nares normal. Septum midline. Mucosa normal. No drainage or sinus tenderness. Throat: lips, mucosa, and tongue normal; teeth and gums normal Neck: no adenopathy, no carotid bruit, no JVD, supple, symmetrical, trachea midline and thyroid not enlarged, symmetric, no tenderness/mass/nodules Lungs: clear to auscultation bilaterally Breasts: normal appearance, no masses or tenderness Heart: regular rate and rhythm, S1, S2 normal, no murmur, click, rub or gallop Abdomen: soft, non-tender; bowel sounds normal; no masses,  no organomegaly Extremities: extremities normal, atraumatic, no cyanosis or edema Pulses: 2+ and symmetric Skin: Skin color, texture, turgor normal. No rashes or lesions Neurologic: Alert and oriented X 3, normal strength and tone. Normal symmetric reflexes. Normal coordination and gait.    Assessment and Plan:   Routine general medical examination at a health care facility Annual exam including breast , excluding pelvic and PAP smear were done today.   Hypertension Well controlled on current regimen. Renal function stable, no changes today.  Lab Results  Component Value Date   CREATININE 0.6 06/13/2013   Lab Results  Component Value Date  NA 139 06/13/2013   K 3.9 06/13/2013   CL 104 06/13/2013   CO2 28 06/13/2013       Updated Medication List Outpatient Encounter Prescriptions as of 06/13/2013  Medication Sig  . Azelastine-Fluticasone (DYMISTA) 137-50 MCG/ACT SUSP Place 1 spray into both nostrils daily.  . Calcium Carbonate (CALTRATE 600 PO) Take 1 tablet by mouth daily.  Marland Kitchen EPIPEN 2-PAK 0.3 MG/0.3ML SOAJ   . estradiol (VIVELLE-DOT) 0.025 MG/24HR Place 1 patch onto the skin 2 (two) times a week.  .  fluticasone-salmeterol (ADVAIR HFA) 115-21 MCG/ACT inhaler Inhale 2 puffs into the lungs daily.    . magnesium 30 MG tablet Take 30 mg by mouth daily.  . Multiple Vitamin (MULTIVITAMIN) capsule Take 1 capsule by mouth daily.  . progesterone (PROMETRIUM) 100 MG capsule Take 1 capsule (100 mg total) by mouth daily.  Marland Kitchen telmisartan (MICARDIS) 40 MG tablet Take 1 tablet (40 mg total) by mouth daily.  . zileuton (ZYFLO) 600 MG TABS Take 600 mg by mouth daily.    . cephALEXin (KEFLEX) 500 MG capsule Take 1 capsule (500 mg total) by mouth 2 (two) times daily.

## 2013-06-14 LAB — FOLATE RBC: RBC Folate: 458 ng/mL (ref >280)

## 2013-06-14 LAB — VITAMIN D 25 HYDROXY (VIT D DEFICIENCY, FRACTURES): Vit D, 25-Hydroxy: 33 ng/mL (ref 30–89)

## 2013-06-15 ENCOUNTER — Other Ambulatory Visit: Payer: Self-pay | Admitting: Internal Medicine

## 2013-06-15 ENCOUNTER — Encounter: Payer: Self-pay | Admitting: Internal Medicine

## 2013-06-15 DIAGNOSIS — E538 Deficiency of other specified B group vitamins: Secondary | ICD-10-CM | POA: Insufficient documentation

## 2013-06-15 DIAGNOSIS — E785 Hyperlipidemia, unspecified: Secondary | ICD-10-CM | POA: Insufficient documentation

## 2013-06-15 MED ORDER — CYANOCOBALAMIN 1000 MCG SL SUBL: 1.0000 | SUBLINGUAL_TABLET | Freq: Every day | SUBLINGUAL | Status: DC

## 2013-06-15 NOTE — Assessment & Plan Note (Signed)
Well controlled on current regimen. Renal function stable, no changes today.  Lab Results  Component Value Date   CREATININE 0.6 06/13/2013   Lab Results  Component Value Date   NA 139 06/13/2013   K 3.9 06/13/2013   CL 104 06/13/2013   CO2 28 06/13/2013

## 2013-06-15 NOTE — Assessment & Plan Note (Addendum)
Annual comprehensive exam was done including breast, excluding pelvic and PAP smear. All screenings have been addressed .  

## 2013-06-15 NOTE — Assessment & Plan Note (Signed)
Mild, no treatment needed.  Lab Results  Component Value Date   CHOL 197 06/13/2013   HDL 49.10 06/13/2013   LDLCALC 120* 06/13/2013   TRIG 142.0 06/13/2013   CHOLHDL 4 06/13/2013

## 2013-06-18 ENCOUNTER — Telehealth: Payer: Self-pay | Admitting: Internal Medicine

## 2013-06-18 MED ORDER — CYANOCOBALAMIN 1000 MCG/ML IJ SOLN: 1000.0000 ug | Freq: Once | INTRAMUSCULAR | Status: DC

## 2013-06-18 NOTE — Progress Notes (Signed)
Have added [facility] to patient chart for convenience.

## 2013-06-18 NOTE — Telephone Encounter (Signed)
Patient notified by phone and voiced understanding and would like to have injections completed at [facility].

## 2013-06-18 NOTE — Telephone Encounter (Signed)
  Patient has not read mychart message  Your vitamin D, thyroid , cholesterol, liver and kidney function are normal. However, your B12 level was deficient. We normally replace B12 initially with IM injections (weekly for 3 weeks, Then monthly) which can be changed to oral supplements after a month or so if you prefer. Please make an appt for a "nurse visit" to get your first injection. While you are waiting, You can start a sublingual b12 supplement (dissolves under the tongue) but you need to get at least 3 injections to be sure we have corrected the deficit. I will send an rx for the sublingual supplement to your pharmacy   Regards,   Dr. Derrel Nip

## 2013-06-18 NOTE — Addendum Note (Signed)
Addended by: Crecencio Mc on: 06/18/2013 02:15 PM   Modules accepted: Orders

## 2013-06-21 ENCOUNTER — Encounter: Payer: Self-pay | Admitting: Obstetrics and Gynecology

## 2013-06-21 ENCOUNTER — Other Ambulatory Visit: Payer: Self-pay | Admitting: *Deleted

## 2013-06-21 ENCOUNTER — Ambulatory Visit (INDEPENDENT_AMBULATORY_CARE_PROVIDER_SITE_OTHER): Payer: BC Managed Care – PPO | Admitting: Obstetrics and Gynecology

## 2013-06-21 VITALS — BP 120/82 | HR 80 | Ht 63.5 in | Wt 150.4 lb

## 2013-06-21 DIAGNOSIS — Z0000 Encounter for general adult medical examination without abnormal findings: Secondary | ICD-10-CM

## 2013-06-21 DIAGNOSIS — Z780 Asymptomatic menopausal state: Secondary | ICD-10-CM

## 2013-06-21 DIAGNOSIS — R8290 Unspecified abnormal findings in urine: Secondary | ICD-10-CM

## 2013-06-21 DIAGNOSIS — N951 Menopausal and female climacteric states: Secondary | ICD-10-CM

## 2013-06-21 DIAGNOSIS — R82998 Other abnormal findings in urine: Secondary | ICD-10-CM

## 2013-06-21 DIAGNOSIS — Z01419 Encounter for gynecological examination (general) (routine) without abnormal findings: Secondary | ICD-10-CM

## 2013-06-21 MED ORDER — PROGESTERONE MICRONIZED 100 MG PO CAPS: 100.0000 mg | ORAL_CAPSULE | Freq: Every day | ORAL | Status: DC

## 2013-06-21 MED ORDER — ESTRADIOL 0.025 MG/24HR TD PTTW: 1.0000 | MEDICATED_PATCH | TRANSDERMAL | Status: DC

## 2013-06-21 NOTE — Progress Notes (Signed)
Patient ID: Jean Luna, female   DOB: [birth year], 58 y.o.   MRN: [id number] GYNECOLOGY VISIT  PCP:   Deborra Medina, MD  Referring provider:   HPI: 58 y.o.   Married  Caucasian  female   [phone] with Patient's last menstrual period was 02/15/2003.   here for  AEX.  Treed to wean off HRT in winter and had hot flashes.  Wants to continue.   Had neck surgery in October.  Paresthesia in right extremity resolved. Taking vit B 12 infection Will do Tetanus injection today as well through PCP.   Children are very busy with educational opportunities. Daughter invited to a debutante ball at [facility].  Hgb:    PCP Urine:  Trace WBC's, Trace RBC's - urine cloudy to patient.   GYNECOLOGIC HISTORY: Patient's last menstrual period was 02/15/2003. Sexually active:  yes Partner preference: female Contraception:   postmenopausal Menopausal hormone therapy: Vivelle Dot, Progesterone 100mg  DES exposure: no   Blood transfusions:   no Sexually transmitted diseases:  ?Hx HPV  GYN procedures and prior surgeries:  Hx Colposcopy 1985 and LEEP procedure 1991. Last mammogram:  05-29-13 [id number]             Last pap and high risk HPV testing:  06-12-12 wnl:neg HR HPV.  History of abnormal pap smear:  1985 had colposcopy but no treatment to cervix.  1991 had LEEP procedure.  Paps all normal since.   OB History   Grav Para Term Preterm Abortions TAB SAB Ect Mult Living   4 1 1  3  3   1        LIFESTYLE: Exercise:  no             Tobacco:  no Alcohol:    no Drug use:  no  OTHER HEALTH MAINTENANCE: Tetanus/TDap:   06-21-13 Gardisil:              n/a Influenza:           11/2012 Zostavax:            Has had shingles  Bone density:      never Colonoscopy:     01/2013 at [facility] with Dr. Nunzio Cobbs small polyps.  Next colonoscopy due 01/2016.  Cholesterol check:  1 week ago - 191.    Family History  Problem Relation Age of Onset  . Celiac  disease Daughter   . Hypertension Mother   . Stroke Mother 70    cerebral aneurysm  . Heart disease Father   . COPD Father 77  . Diabetes Father   . Breast cancer Maternal Grandmother 61    dec 89   . Diabetes Paternal Grandfather     Patient Active Problem List   Diagnosis Date Noted  . Other and unspecified hyperlipidemia 06/15/2013  . Vitamin B12 deficiency without anemia 06/15/2013  . Yeast infection 09/28/2012  . Routine general medical examination at a health care facility 06/12/2012  . Overweight (BMI 25.0-29.9) 06/12/2012  . Snoring disorder 03/07/2012  . Raynaud phenomenon   . Colon polyps 01/13/2011  . Bronchitis with chronic airway obstruction 01/12/2011  . Sinusitis, chronic 01/12/2011  . Hypertension 01/12/2011  . Cervicalgia 01/12/2011  . Migraine, menstrual    Past Medical History  Diagnosis Date  . Hypertension   . Asthma   . Migraine, menstrual     resolved with menopause  . Raynaud phenomenon   . Facial paralysis/Bells palsy     6  week paralysis of right side of face   . Fibroid   . Cervical disc herniation   . History of colon polyps     adenomatous  . History of abnormal cervical Pap smear     Past Surgical History  Procedure Laterality Date  . Nasal sinus surgery    . Cervical cone biopsy  1985  . Cervical biopsy  w/ loop electrode excision  1991  . Cervical disc surgery  11/2012    --removed bone spurs, bulging disc  . Colposcopy  1984    no treatment    ALLERGIES: Review of patient's allergies indicates no known allergies.  Current Outpatient Prescriptions  Medication Sig Dispense Refill  . Azelastine-Fluticasone (DYMISTA) 137-50 MCG/ACT SUSP Place 1 spray into both nostrils daily.      . Calcium Carbonate (CALTRATE 600 PO) Take 1 tablet by mouth daily.      . cyanocobalamin (,VITAMIN B-12,) 1000 MCG/ML injection Inject 1 mL (1,000 mcg total) into the muscle once. I ml IM injection monthly  10 mL  2  . EPIPEN 2-PAK 0.3 MG/0.3ML SOAJ        . estradiol (VIVELLE-DOT) 0.025 MG/24HR Place 1 patch onto the skin 2 (two) times a week.  8 patch  7  . fluticasone-salmeterol (ADVAIR HFA) 115-21 MCG/ACT inhaler Inhale 2 puffs into the lungs daily.        . Multiple Vitamin (MULTIVITAMIN) capsule Take 1 capsule by mouth daily.      . progesterone (PROMETRIUM) 100 MG capsule Take 1 capsule (100 mg total) by mouth daily.  30 capsule  5  . pseudoephedrine-guaifenesin (MUCINEX D) 60-600 MG per tablet Take 1 tablet by mouth every 12 (twelve) hours.      Marland Kitchen telmisartan (MICARDIS) 40 MG tablet Take 1 tablet (40 mg total) by mouth daily.  90 tablet  2  . zileuton (ZYFLO) 600 MG TABS Take 600 mg by mouth daily.         No current facility-administered medications for this visit.     ROS:  Pertinent items are noted in HPI.  SOCIAL HISTORY:  Self-employed.  PHYSICAL EXAMINATION:    BP 120/82  Pulse 80  Ht 5' 3.5" (1.613 m)  Wt 150 lb 6.4 oz (68.221 kg)  BMI 26.22 kg/m2  LMP 02/15/2003   Wt Readings from Last 3 Encounters:  06/21/13 150 lb 6.4 oz (68.221 kg)  06/13/13 147 lb 8 oz (66.906 kg)  01/29/13 150 lb (68.04 kg)     Ht Readings from Last 3 Encounters:  06/21/13 5' 3.5" (1.613 m)  06/13/13 5' 3.5" (1.613 m)  06/20/12 5' 3.5" (1.613 m)    General appearance: alert, cooperative and appears stated age Head: Normocephalic, without obvious abnormality, atraumatic Neck: no adenopathy, supple, symmetrical, trachea midline and thyroid not enlarged, symmetric, no tenderness/mass/nodules Lungs: clear to auscultation bilaterally Breasts: Inspection negative, No nipple retraction or dimpling, No nipple discharge or bleeding, No axillary or supraclavicular adenopathy, Normal to palpation without dominant masses Heart: regular rate and rhythm Abdomen: soft, non-tender; no masses,  no organomegaly Extremities: extremities normal, atraumatic, no cyanosis or edema Skin: Skin color, texture, turgor normal. No rashes or lesions Lymph  nodes: Cervical, supraclavicular, and axillary nodes normal. No abnormal inguinal nodes palpated Neurologic: Grossly normal  Pelvic: External genitalia:  no lesions              Urethra:  normal appearing urethra with no masses, tenderness or lesions  Bartholins and Skenes: normal                 Vagina: normal appearing vagina with normal color and discharge, no lesions              Cervix: normal appearance              Pap and high risk HPV testing done: no.            Bimanual Exam:  Uterus:  uterus is normal size, shape, consistency and nontender                                      Adnexa: normal adnexa in size, nontender and no masses                                      Rectovaginal: Confirms                                      Anus:  normal sphincter tone, no lesions  ASSESSMENT  Normal gynecologic exam. Abnormal urine.  HRT patient.   PLAN  Mammogram recommended yearly.  Pap smear and high risk HPV testing not indicated.  REfill of Vivelle Dot 0.025 twice weekly and Prometrium 100 mg daily.  3 month supplies with 3 refills. I discussed risks of MI, stroke, breast cancer, DVT/PE. Counseled on self breast exam, Calcium and vitamin D intake, exercise. Return annually or prn   An After Visit Summary was printed and given to the patient.

## 2013-06-21 NOTE — Patient Instructions (Signed)

## 2013-06-24 ENCOUNTER — Other Ambulatory Visit: Payer: Self-pay | Admitting: *Deleted

## 2013-06-24 DIAGNOSIS — Z780 Asymptomatic menopausal state: Secondary | ICD-10-CM

## 2013-06-24 LAB — CULTURE, URINE COMPREHENSIVE

## 2013-06-27 ENCOUNTER — Other Ambulatory Visit: Payer: Self-pay | Admitting: *Deleted

## 2013-06-27 ENCOUNTER — Encounter: Payer: Self-pay | Admitting: Internal Medicine

## 2013-06-27 DIAGNOSIS — Z780 Asymptomatic menopausal state: Secondary | ICD-10-CM

## 2013-07-24 ENCOUNTER — Ambulatory Visit: Payer: Self-pay | Admitting: Internal Medicine

## 2013-07-30 ENCOUNTER — Telehealth: Payer: Self-pay | Admitting: Internal Medicine

## 2013-07-30 DIAGNOSIS — R0683 Snoring: Secondary | ICD-10-CM

## 2013-07-30 NOTE — Telephone Encounter (Signed)
Left message to call office

## 2013-07-30 NOTE — Telephone Encounter (Signed)
Normal sleep study was recently reported June 2015;  No sleep apnea documented..  Needs to turn TV off at bedtime.

## 2013-08-01 ENCOUNTER — Encounter: Payer: Self-pay | Admitting: *Deleted

## 2013-08-01 NOTE — Telephone Encounter (Signed)
Letter mailed

## 2013-08-09 ENCOUNTER — Encounter: Payer: Self-pay | Admitting: Internal Medicine

## 2013-08-28 ENCOUNTER — Other Ambulatory Visit: Payer: Self-pay | Admitting: Internal Medicine

## 2013-09-23 ENCOUNTER — Ambulatory Visit: Payer: Self-pay | Admitting: Unknown Physician Specialty

## 2013-10-08 ENCOUNTER — Encounter: Payer: Self-pay | Admitting: Obstetrics and Gynecology

## 2013-10-09 ENCOUNTER — Ambulatory Visit (INDEPENDENT_AMBULATORY_CARE_PROVIDER_SITE_OTHER): Payer: BC Managed Care – PPO | Admitting: Cardiovascular Disease

## 2013-10-09 ENCOUNTER — Encounter: Payer: Self-pay | Admitting: Cardiovascular Disease

## 2013-10-09 VITALS — BP 140/98 | HR 75 | Ht 63.0 in | Wt 149.5 lb

## 2013-10-09 DIAGNOSIS — J4520 Mild intermittent asthma, uncomplicated: Secondary | ICD-10-CM

## 2013-10-09 DIAGNOSIS — I10 Essential (primary) hypertension: Secondary | ICD-10-CM

## 2013-10-09 DIAGNOSIS — E785 Hyperlipidemia, unspecified: Secondary | ICD-10-CM

## 2013-10-09 DIAGNOSIS — R0609 Other forms of dyspnea: Secondary | ICD-10-CM | POA: Insufficient documentation

## 2013-10-09 DIAGNOSIS — R0602 Shortness of breath: Secondary | ICD-10-CM

## 2013-10-09 DIAGNOSIS — J329 Chronic sinusitis, unspecified: Secondary | ICD-10-CM

## 2013-10-09 DIAGNOSIS — R0789 Other chest pain: Secondary | ICD-10-CM | POA: Insufficient documentation

## 2013-10-09 DIAGNOSIS — J45909 Unspecified asthma, uncomplicated: Secondary | ICD-10-CM

## 2013-10-09 DIAGNOSIS — R0600 Dyspnea, unspecified: Secondary | ICD-10-CM | POA: Insufficient documentation

## 2013-10-09 HISTORY — DX: Mild intermittent asthma, uncomplicated: J45.20

## 2013-10-09 NOTE — Assessment & Plan Note (Signed)
Etiology of her shortness of breath is unclear. Echocardiogram has been ordered to rule out elevated right heart pressures. EKG is essentially normal, clinical exam essentially normal. Unable to exclude asthma as a cause of her symptoms

## 2013-10-09 NOTE — Assessment & Plan Note (Signed)
Blood pressure is well controlled at home. No changes made to the medications. Elevated in the office but no medication changes made.

## 2013-10-09 NOTE — Progress Notes (Signed)
Patient ID: Jean Luna, female    DOB: Sep 18, 1955, 58 y.o.   MRN: [id number]  HPI Comments: [name] is a pleasant 58 year old woman with a long history of severe asthma, follow-through [facility] , snoring, sinus issues followed by Dr. Tami Ribas, who presents for severe shortness of breath.  She details a long extensive history of allergy-induced asthma. She has been tried on numerous inhalers, there is medications sometimes with mixed results. In July she reports symptoms are getting worse, Advair dose was increased which seemed to make her symptoms even more severe. He describes a severe shortness of breath sometimes at rest, sometimes worse with exertion. Advair seem to make her symptoms worse. This was held and she was placed on a prednisone taper. This did make her symptoms somewhat improved but shortness of breath and chest tightness persisted. He describes having a heaviness in her chest frequently. She was started on [facility] and symptoms recurred, somewhat improved without the inhaler but still has tightness in the chest with shortness of breath. She's now back on prednisone on her fourth day and continues to have symptoms.  She does report recent indigestion symptoms he does have a family history of GI-related cancer.  She's not been active in the past several months secondary to her shortness of breath. She has difficulty walking any significant distance find herself being very sedentary secondary to her respiratory distress.  She was recently seen by Dr. Tami Ribas Jul 09 2013. Concern of sinusitis and was treated with antibiotics  Father was a previous smoker, had COPD also with cardiac issues Mother had no coronary disease  EKG shows normal sinus rhythm with rate 75 beats a minute with no significant ST or T wave changes   Outpatient Encounter Prescriptions as of 10/09/2013  Medication Sig  . Azelastine-Fluticasone (DYMISTA) 137-50 MCG/ACT SUSP Place 1 spray into  both nostrils daily.  . Calcium Carbonate (CALTRATE 600 PO) Take 1 tablet by mouth daily.  . cyanocobalamin (,VITAMIN B-12,) 1000 MCG/ML injection Inject 1 mL (1,000 mcg total) into the muscle once. I ml IM injection monthly  . EPIPEN 2-PAK 0.3 MG/0.3ML SOAJ   . esomeprazole (NEXIUM) 40 MG capsule Take 40 mg by mouth daily at 12 noon.  Marland Kitchen estradiol (VIVELLE-DOT) 0.025 MG/24HR Place 1 patch onto the skin 2 (two) times a week.  . Multiple Vitamin (MULTIVITAMIN) capsule Take 1 capsule by mouth daily.  . predniSONE (DELTASONE) 10 MG tablet 10 mg taper.  . progesterone (PROMETRIUM) 100 MG capsule Take 1 capsule (100 mg total) by mouth daily.  Marland Kitchen telmisartan (MICARDIS) 40 MG tablet TAKE ONE TABLET EVERY DAY  . zileuton (ZYFLO) 600 MG TABS Take 1,200 mg by mouth 2 (two) times daily.   . mometasone-formoterol (DULERA) 100-5 MCG/ACT AERO Inhale 2 puffs into the lungs 2 (two) times daily.    Review of Systems  Constitutional: Negative.   HENT: Negative.   Eyes: Negative.   Respiratory: Positive for chest tightness and shortness of breath.   Cardiovascular: Positive for chest pain.  Gastrointestinal: Negative.   Endocrine: Negative.   Musculoskeletal: Negative.   Skin: Negative.   Allergic/Immunologic: Negative.   Neurological: Negative.   Hematological: Negative.   Psychiatric/Behavioral: Negative.   All other systems reviewed and are negative.   BP 140/98  Pulse 75  Ht 5\' 3"  (1.6 m)  Wt 149 lb 8 oz (67.813 kg)  BMI 26.49 kg/m2  LMP 02/15/2003  Physical Exam  Nursing note and vitals reviewed. Constitutional: She is oriented  to person, place, and time. She appears well-developed and well-nourished.  HENT:  Head: Normocephalic.  Nose: Nose normal.  Mouth/Throat: Oropharynx is clear and moist.  Eyes: Conjunctivae are normal. Pupils are equal, round, and reactive to light.  Neck: Normal range of motion. Neck supple. No JVD present.  Cardiovascular: Normal rate, regular rhythm, S1  normal, S2 normal, normal heart sounds and intact distal pulses.  Exam reveals no gallop and no friction rub.   No murmur heard. Pulmonary/Chest: Effort normal and breath sounds normal. No respiratory distress. She has no wheezes. She has no rales. She exhibits no tenderness.  Abdominal: Soft. Bowel sounds are normal. She exhibits no distension. There is no tenderness.  Musculoskeletal: Normal range of motion. She exhibits no edema and no tenderness.  Lymphadenopathy:    She has no cervical adenopathy.  Neurological: She is alert and oriented to person, place, and time. Coordination normal.  Skin: Skin is warm and dry. No rash noted. No erythema.  Psychiatric: She has a normal mood and affect. Her behavior is normal. Judgment and thought content normal.    Assessment and Plan

## 2013-10-09 NOTE — Assessment & Plan Note (Signed)
Cholesterol not discussed with her today. Most recent cholesterol level was 197. Likely will not require a cholesterol medication

## 2013-10-09 NOTE — Assessment & Plan Note (Signed)
She reports having allergy induced asthma. She sees a pulmonologist at [facility] and has seen Dr. Tami Ribas of ENT. Currently on a prednisone taper. She does not seem to do well on inhaled steroids such as Advair or dulera. This seems to cause chest tightness. Albuterol seems to work the best for her. Uncertain if she may benefit from a long-acting bronchodilator.

## 2013-10-09 NOTE — Patient Instructions (Addendum)
Etiology of your shortness of breath is unclear at this time No medication changes were made.  We will schedule a stress test (myoview) for shortness of breath  We will schedule an echocardiogram for shortness of breath, chest tightness  Please call us if you have new issues that need to be addressed before your next appt.    [place]  Your caregiver has ordered a Stress Test with nuclear imaging. The purpose of this test is to evaluate the blood supply to your heart muscle. This procedure is referred to as a "Non-Invasive Stress Test." This is because other than having an IV started in your vein, nothing is inserted or "invades" your body. Cardiac stress tests are done to find areas of poor blood flow to the heart by determining the extent of coronary artery disease (CAD). Some patients exercise on a treadmill, which naturally increases the blood flow to your heart, while others who are  unable to walk on a treadmill due to physical limitations have a pharmacologic/chemical stress agent called Lexiscan . This medicine will mimic walking on a treadmill by temporarily increasing your coronary blood flow.   Please note: these test may take anywhere between 2-4 hours to complete  PLEASE REPORT TO [facility] AT THE FIRST DESK WILL DIRECT YOU WHERE TO GO  Date of Procedure:_______Thursday, August 27_________  Arrival Time for Procedure:______7:15am________________  Instructions regarding medication:    PLEASE NOTIFY THE OFFICE AT LEAST 24 HOURS IN ADVANCE IF YOU ARE UNABLE TO KEEP YOUR APPOINTMENT.  [phone] AND  PLEASE NOTIFY NUCLEAR MEDICINE AT [facility] AT LEAST 24 HOURS IN ADVANCE IF YOU ARE UNABLE TO KEEP YOUR APPOINTMENT. [phone]  How to prepare for your Myoview test:  1. Do not eat or drink after midnight 2. No caffeine for 24 hours prior to test 3. No smoking 24 hours prior to test. 4. Your medication may be taken with water.  If your  doctor stopped a medication because of this test, do not take that medication. 5. Ladies, please do not wear dresses.  Skirts or pants are appropriate. Please wear a short sleeve shirt. 6. No perfume, cologne or lotion. 7. Wear comfortable walking shoes. No heels!

## 2013-10-09 NOTE — Assessment & Plan Note (Signed)
Recently seen by Dr. Tami Ribas, given antibiotics in may for suspected sinusitis

## 2013-10-09 NOTE — Assessment & Plan Note (Signed)
She does report significant chest tightness with her shortness of breath. She is concerned about cardiac issues, coronary artery disease. She does report a family history. Father had bypass surgery but lived until 59 years old. She does feel that she is able to treadmill given her severe shortness of breath. We will order a Lexi scan Myoview. Recommend she bring her inhaler with her in case she does have bronchospasm during the test.

## 2013-10-10 ENCOUNTER — Ambulatory Visit: Payer: Self-pay | Admitting: Cardiovascular Disease

## 2013-10-10 ENCOUNTER — Other Ambulatory Visit: Payer: Self-pay

## 2013-10-10 DIAGNOSIS — R0789 Other chest pain: Secondary | ICD-10-CM

## 2013-10-10 DIAGNOSIS — R0602 Shortness of breath: Secondary | ICD-10-CM

## 2013-10-11 ENCOUNTER — Other Ambulatory Visit (INDEPENDENT_AMBULATORY_CARE_PROVIDER_SITE_OTHER): Payer: BC Managed Care – PPO

## 2013-10-11 ENCOUNTER — Other Ambulatory Visit: Payer: Self-pay

## 2013-10-11 DIAGNOSIS — R0602 Shortness of breath: Secondary | ICD-10-CM

## 2013-10-11 DIAGNOSIS — R0789 Other chest pain: Secondary | ICD-10-CM

## 2013-10-11 DIAGNOSIS — R079 Chest pain, unspecified: Secondary | ICD-10-CM

## 2013-10-28 ENCOUNTER — Ambulatory Visit: Payer: BC Managed Care – PPO | Admitting: Cardiovascular Disease

## 2013-11-29 ENCOUNTER — Other Ambulatory Visit: Payer: Self-pay

## 2013-12-10 ENCOUNTER — Telehealth: Payer: Self-pay | Admitting: Obstetrics and Gynecology

## 2013-12-10 NOTE — Telephone Encounter (Signed)
Dr cx appt attempted to call to rs cell # a man answered and said it was incorrect # home # is disconnected. Will send message through [facility]

## 2013-12-16 ENCOUNTER — Encounter: Payer: Self-pay | Admitting: Cardiovascular Disease

## 2013-12-23 LAB — PULMONARY FUNCTION TEST

## 2013-12-31 ENCOUNTER — Telehealth: Payer: Self-pay | Admitting: Internal Medicine

## 2013-12-31 DIAGNOSIS — J449 Chronic obstructive pulmonary disease, unspecified: Secondary | ICD-10-CM

## 2013-12-31 DIAGNOSIS — J45909 Unspecified asthma, uncomplicated: Secondary | ICD-10-CM

## 2014-02-04 ENCOUNTER — Encounter: Payer: Self-pay | Admitting: *Deleted

## 2014-02-18 NOTE — Telephone Encounter (Signed)
Mailed unread message to pt  

## 2014-06-11 ENCOUNTER — Telehealth: Payer: Self-pay

## 2014-06-11 NOTE — Telephone Encounter (Signed)
The patient called hoping to get a mammogram scheduled.

## 2014-06-15 HISTORY — PX: FINGER SURGERY: SHX640

## 2014-06-18 ENCOUNTER — Encounter: Payer: Self-pay | Admitting: Internal Medicine

## 2014-06-18 ENCOUNTER — Ambulatory Visit (INDEPENDENT_AMBULATORY_CARE_PROVIDER_SITE_OTHER): Payer: BLUE CROSS/BLUE SHIELD | Admitting: Internal Medicine

## 2014-06-18 VITALS — BP 132/80 | HR 79 | Temp 98.4°F | Resp 16 | Ht 63.25 in | Wt 154.5 lb

## 2014-06-18 DIAGNOSIS — K635 Polyp of colon: Secondary | ICD-10-CM | POA: Diagnosis not present

## 2014-06-18 DIAGNOSIS — R41841 Cognitive communication deficit: Secondary | ICD-10-CM

## 2014-06-18 DIAGNOSIS — Z1239 Encounter for other screening for malignant neoplasm of breast: Secondary | ICD-10-CM

## 2014-06-18 DIAGNOSIS — N943 Premenstrual tension syndrome: Secondary | ICD-10-CM

## 2014-06-18 DIAGNOSIS — J4540 Moderate persistent asthma, uncomplicated: Secondary | ICD-10-CM

## 2014-06-18 DIAGNOSIS — G8929 Other chronic pain: Secondary | ICD-10-CM

## 2014-06-18 DIAGNOSIS — E663 Overweight: Secondary | ICD-10-CM

## 2014-06-18 DIAGNOSIS — E785 Hyperlipidemia, unspecified: Secondary | ICD-10-CM

## 2014-06-18 DIAGNOSIS — R4189 Other symptoms and signs involving cognitive functions and awareness: Secondary | ICD-10-CM

## 2014-06-18 DIAGNOSIS — Z1211 Encounter for screening for malignant neoplasm of colon: Secondary | ICD-10-CM

## 2014-06-18 DIAGNOSIS — Z8249 Family history of ischemic heart disease and other diseases of the circulatory system: Secondary | ICD-10-CM

## 2014-06-18 DIAGNOSIS — I10 Essential (primary) hypertension: Secondary | ICD-10-CM

## 2014-06-18 DIAGNOSIS — E559 Vitamin D deficiency, unspecified: Secondary | ICD-10-CM

## 2014-06-18 DIAGNOSIS — Z0000 Encounter for general adult medical examination without abnormal findings: Secondary | ICD-10-CM | POA: Diagnosis not present

## 2014-06-18 DIAGNOSIS — R519 Headache, unspecified: Secondary | ICD-10-CM

## 2014-06-18 DIAGNOSIS — G43829 Menstrual migraine, not intractable, without status migrainosus: Secondary | ICD-10-CM

## 2014-06-18 DIAGNOSIS — R51 Headache: Secondary | ICD-10-CM | POA: Diagnosis not present

## 2014-06-18 DIAGNOSIS — Z1159 Encounter for screening for other viral diseases: Secondary | ICD-10-CM

## 2014-06-18 NOTE — Patient Instructions (Signed)
Mammogram order printed for your use  Return for fasting labs  MRI/MRA and neurology evaluation to be set up at [facility] Maintenance Adopting a healthy lifestyle and getting preventive care can go a long way to promote health and wellness. Talk with your health care provider about what schedule of regular examinations is right for you. This is a good chance for you to check in with your provider about disease prevention and staying healthy. In between checkups, there are plenty of things you can do on your own. Experts have done a lot of research about which lifestyle changes and preventive measures are most likely to keep you healthy. Ask your health care provider for more information. WEIGHT AND DIET  Eat a healthy diet  Be sure to include plenty of vegetables, fruits, low-fat dairy products, and lean protein.  Do not eat a lot of foods high in solid fats, added sugars, or salt.  Get regular exercise. This is one of the most important things you can do for your health.  Most adults should exercise for at least 150 minutes each week. The exercise should increase your heart rate and make you sweat (moderate-intensity exercise).  Most adults should also do strengthening exercises at least twice a week. This is in addition to the moderate-intensity exercise.  Maintain a healthy weight  Body mass index (BMI) is a measurement that can be used to identify possible weight problems. It estimates body fat based on height and weight. Your health care provider can help determine your BMI and help you achieve or maintain a healthy weight.  For females 60 years of age and older:   A BMI below 18.5 is considered underweight.  A BMI of 18.5 to 24.9 is normal.  A BMI of 25 to 29.9 is considered overweight.  A BMI of 30 and above is considered obese.  Watch levels of cholesterol and blood lipids  You should start having your blood tested for lipids and cholesterol at 59 years of age, then  have this test every 5 years.  You may need to have your cholesterol levels checked more often if:  Your lipid or cholesterol levels are high.  You are older than 60 years of age.  You are at high risk for heart disease.  CANCER SCREENING   Lung Cancer  Lung cancer screening is recommended for adults 71-84 years old who are at high risk for lung cancer because of a history of smoking.  A yearly low-dose CT scan of the lungs is recommended for people who:  Currently smoke.  Have quit within the past 15 years.  Have at least a 30-pack-year history of smoking. A pack year is smoking an average of one pack of cigarettes a day for 1 year.  Yearly screening should continue until it has been 15 years since you quit.  Yearly screening should stop if you develop a health problem that would prevent you from having lung cancer treatment.  Breast Cancer  Practice breast self-awareness. This means understanding how your breasts normally appear and feel.  It also means doing regular breast self-exams. Let your health care provider know about any changes, no matter how small.  If you are in your 20s or 30s, you should have a clinical breast exam (CBE) by a health care provider every 1-3 years as part of a regular health exam.  If you are 89 or older, have a CBE every year. Also consider having a breast X-ray (mammogram) every  year.  If you have a family history of breast cancer, talk to your health care provider about genetic screening.  If you are at high risk for breast cancer, talk to your health care provider about having an MRI and a mammogram every year.  Breast cancer gene (BRCA) assessment is recommended for women who have family members with BRCA-related cancers. BRCA-related cancers include:  Breast.  Ovarian.  Tubal.  Peritoneal cancers.  Results of the assessment will determine the need for genetic counseling and BRCA1 and BRCA2 testing. Cervical Cancer Routine  pelvic examinations to screen for cervical cancer are no longer recommended for nonpregnant women who are considered low risk for cancer of the pelvic organs (ovaries, uterus, and vagina) and who do not have symptoms. A pelvic examination may be necessary if you have symptoms including those associated with pelvic infections. Ask your health care provider if a screening pelvic exam is right for you.   The Pap test is the screening test for cervical cancer for women who are considered at risk.  If you had a hysterectomy for a problem that was not cancer or a condition that could lead to cancer, then you no longer need Pap tests.  If you are older than 65 years, and you have had normal Pap tests for the past 10 years, you no longer need to have Pap tests.  If you have had past treatment for cervical cancer or a condition that could lead to cancer, you need Pap tests and screening for cancer for at least 20 years after your treatment.  If you no longer get a Pap test, assess your risk factors if they change (such as having a new sexual partner). This can affect whether you should start being screened again.  Some women have medical problems that increase their chance of getting cervical cancer. If this is the case for you, your health care provider may recommend more frequent screening and Pap tests.  The human papillomavirus (HPV) test is another test that may be used for cervical cancer screening. The HPV test looks for the virus that can cause cell changes in the cervix. The cells collected during the Pap test can be tested for HPV.  The HPV test can be used to screen women 62 years of age and older. Getting tested for HPV can extend the interval between normal Pap tests from three to five years.  An HPV test also should be used to screen women of any age who have unclear Pap test results.  After 61 years of age, women should have HPV testing as often as Pap tests.  Colorectal Cancer  This  type of cancer can be detected and often prevented.  Routine colorectal cancer screening usually begins at 60 years of age and continues through 60 years of age.  Your health care provider may recommend screening at an earlier age if you have risk factors for colon cancer.  Your health care provider may also recommend using home test kits to check for hidden blood in the stool.  A small camera at the end of a tube can be used to examine your colon directly (sigmoidoscopy or colonoscopy). This is done to check for the earliest forms of colorectal cancer.  Routine screening usually begins at age 36.  Direct examination of the colon should be repeated every 5-10 years through 60 years of age. However, you may need to be screened more often if early forms of precancerous polyps or small growths are found.  Skin Cancer  Check your skin from head to toe regularly.  Tell your health care provider about any new moles or changes in moles, especially if there is a change in a mole's shape or color.  Also tell your health care provider if you have a mole that is larger than the size of a pencil eraser.  Always use sunscreen. Apply sunscreen liberally and repeatedly throughout the day.  Protect yourself by wearing long sleeves, pants, a wide-brimmed hat, and sunglasses whenever you are outside. HEART DISEASE, DIABETES, AND HIGH BLOOD PRESSURE   Have your blood pressure checked at least every 1-2 years. High blood pressure causes heart disease and increases the risk of stroke.  If you are between 11 years and 53 years old, ask your health care provider if you should take aspirin to prevent strokes.  Have regular diabetes screenings. This involves taking a blood sample to check your fasting blood sugar level.  If you are at a normal weight and have a low risk for diabetes, have this test once every three years after 60 years of age.  If you are overweight and have a high risk for diabetes,  consider being tested at a younger age or more often. PREVENTING INFECTION  Hepatitis B  If you have a higher risk for hepatitis B, you should be screened for this virus. You are considered at high risk for hepatitis B if:  You were born in a country where hepatitis B is common. Ask your health care provider which countries are considered high risk.  Your parents were born in a high-risk country, and you have not been immunized against hepatitis B (hepatitis B vaccine).  You have HIV or AIDS.  You use needles to inject street drugs.  You live with someone who has hepatitis B.  You have had sex with someone who has hepatitis B.  You get hemodialysis treatment.  You take certain medicines for conditions, including cancer, organ transplantation, and autoimmune conditions. Hepatitis C  Blood testing is recommended for:  Everyone born from [birth year] through 1966.  Anyone with known risk factors for hepatitis C. Sexually transmitted infections (STIs)  You should be screened for sexually transmitted infections (STIs) including gonorrhea and chlamydia if:  You are sexually active and are younger than 60 years of age.  You are older than 60 years of age and your health care provider tells you that you are at risk for this type of infection.  Your sexual activity has changed since you were last screened and you are at an increased risk for chlamydia or gonorrhea. Ask your health care provider if you are at risk.  If you do not have HIV, but are at risk, it may be recommended that you take a prescription medicine daily to prevent HIV infection. This is called pre-exposure prophylaxis (PrEP). You are considered at risk if:  You are sexually active and do not regularly use condoms or know the HIV status of your partner(s).  You take drugs by injection.  You are sexually active with a partner who has HIV. Talk with your health care provider about whether you are at high risk of being  infected with HIV. If you choose to begin PrEP, you should first be tested for HIV. You should then be tested every 3 months for as long as you are taking PrEP.  PREGNANCY   If you are premenopausal and you may become pregnant, ask your health care provider about preconception counseling.  If you  may become pregnant, take 400 to 800 micrograms (mcg) of folic acid every day.  If you want to prevent pregnancy, talk to your health care provider about birth control (contraception). OSTEOPOROSIS AND MENOPAUSE   Osteoporosis is a disease in which the bones lose minerals and strength with aging. This can result in serious bone fractures. Your risk for osteoporosis can be identified using a bone density scan.  If you are 52 years of age or older, or if you are at risk for osteoporosis and fractures, ask your health care provider if you should be screened.  Ask your health care provider whether you should take a calcium or vitamin D supplement to lower your risk for osteoporosis.  Menopause may have certain physical symptoms and risks.  Hormone replacement therapy may reduce some of these symptoms and risks. Talk to your health care provider about whether hormone replacement therapy is right for you.  HOME CARE INSTRUCTIONS   Schedule regular health, dental, and eye exams.  Stay current with your immunizations.   Do not use any tobacco products including cigarettes, chewing tobacco, or electronic cigarettes.  If you are pregnant, do not drink alcohol.  If you are breastfeeding, limit how much and how often you drink alcohol.  Limit alcohol intake to no more than 1 drink per day for nonpregnant women. One drink equals 12 ounces of beer, 5 ounces of wine, or 1 ounces of hard liquor.  Do not use street drugs.  Do not share needles.  Ask your health care provider for help if you need support or information about quitting drugs.  Tell your health care provider if you often feel  depressed.  Tell your health care provider if you have ever been abused or do not feel safe at home. Document Released: 08/16/2010 Document Revised: 06/17/2013 Document Reviewed: 01/02/2013 [facility] Patient Information 2015 [place], [place]. This information is not intended to replace advice given to you by your health care provider. Make sure you discuss any questions you have with your health care provider.

## 2014-06-18 NOTE — Progress Notes (Signed)
Patient ID: Jean Luna, female   DOB: August 15, 1955, 59 y.o.   MRN: [id number]   Subjective:     Jean Luna is a 59 y.o. female and is here for a comprehensive physical exam. The patient reports problems -  as described below:. Annual nongyn exam.   PAP is scheduled for next week by Dr. Sharen Counter.  Needs mammogram done at [facility].  Last abnomal PAP smear was over 10 years ago , and LEEP was normal.  but still getting annual exams  1) Having pain due to arthritis in both hands,  Joint cyst was removed yesterday from index finger by Dr Sharman Cheek , hand surgeon at [facility] ,  Under conscious sedation,  Has been having sideffects form anesthesia (Diprivan) with headache and agitation all night long   2) Cognitive complaints.  She has been mixing up her words when talking.  Says the wrong  Direction  (left instead of right), and for the past 6 months frequently forgets what she is going to say,    Mother had a massive cerebral hemorrhage,  Was told she may have a hereditary disposition to rupture butpatient has never had an MRI .  Has had migraines for years,  since age 42, but has not had one in over 1.5 years. Monthly migraines stopped with menopause,  Rare since then .  No other neurologicc symptoms except feels easily distracted.     3) Asthma: She has not had any recetn exacerations.     Has been using mutliple medications including Alleggra, Avair,  singulair at night .  She uses her albuterol < 2 times per month .PFTS done at [facility]      History   Social History  . Marital Status: Married    Spouse Name: N/A  . Number of Children: N/A  . Years of Education: N/A   Occupational History  . Not on file.   Social History Main Topics  . Smoking status: Never Smoker   . Smokeless tobacco: Never Used  . Alcohol Use: No  . Drug Use: No  . Sexual Activity:    Partners: Male    Birth Control/ Protection: Post-menopausal   Other Topics Concern  . Not on file   Social History  Narrative   Health Maintenance  Topic Date Due  . HIV Screening  06/12/1969  . ZOSTAVAX  06/13/2014  . INFLUENZA VACCINE  09/15/2014  . MAMMOGRAM  05/30/2015  . PAP SMEAR  06/13/2015  . COLONOSCOPY  01/24/2023  . TETANUS/TDAP  06/22/2023    The following portions of the patient's history were reviewed and updated as appropriate: allergies, current medications, past family history, past medical history, past social history, past surgical history and problem list.  Review of Systems  Patient denies headache, fevers, malaise, unintentional weight loss, skin rash, eye pain, sinus congestion and sinus pain, sore throat, dysphagia,  hemoptysis , cough, dyspnea, wheezing, chest pain, palpitations, orthopnea, edema, abdominal pain, nausea, melena, diarrhea, constipation, flank pain, dysuria, hematuria, urinary  Frequency, nocturia, numbness, tingling, seizures,  Focal weakness, Loss of consciousness,  Tremor, insomnia, depression, anxiety, and suicidal ideation.     Objective:   BP 132/80 mmHg  Pulse 79  Temp(Src) 98.4 F (36.9 C) (Oral)  Resp 16  Ht 5' 3.25" (1.607 m)  Wt 154 lb 8 oz (70.081 kg)  BMI 27.14 kg/m2  SpO2 98%  LMP 02/15/2003  General appearance: alert, cooperative and appears stated age Head: Normocephalic, without obvious abnormality, atraumatic Eyes: conjunctivae/corneas  clear. PERRL, EOM's intact. Fundi benign. Ears: normal TM's and external ear canals both ears Nose: Nares normal. Septum midline. Mucosa normal. No drainage or sinus tenderness. Throat: lips, mucosa, and tongue normal; teeth and gums normal Neck: no adenopathy, no carotid bruit, no JVD, supple, symmetrical, trachea midline and thyroid not enlarged, symmetric, no tenderness/mass/nodules Lungs: clear to auscultation bilaterally Breasts: normal appearance, no masses or tenderness Heart: regular rate and rhythm, S1, S2 normal, no murmur, click, rub or gallop Abdomen: soft, non-tender; bowel sounds  normal; no masses,  no organomegaly Extremities: extremities normal, atraumatic, no cyanosis or edema Pulses: 2+ and symmetric Skin: Skin color, texture, turgor normal. No rashes or lesions Neurologic: Alert and oriented X 3, normal strength and tone. Normal symmetric reflexes. Normal coordination and gait.   Marland KitchenMMSE:  29/30   Assessment and Plan::    Problem List Items Addressed This Visit    Hypertension    BP 132/80 mmHg  Pulse 79  Temp(Src) 98.4 F (36.9 C) (Oral)  Resp 16  Ht 5' 3.25" (1.607 m)  Wt 154 lb 8 oz (70.081 kg)  BMI 27.14 kg/m2  SpO2 98%  LMP 02/15/2003  Well controlled on current regimen. Renal function  Is due  Lab Results  Component Value Date   CREATININE 0.6 06/13/2013         Migraine, menstrual    Occurring rarely since menopause        Relevant Medications   oxyCODONE (OXY IR/ROXICODONE) 5 MG immediate release tablet   Colon polyps    Repeat colonoscopy was due 2015 but not done.  Referral to [facility] in process.      Overweight (BMI 25.0-29.9)    Body mass index is 27.14 kg/(m^2). I have addressed  BMI and recommended a low glycemic index diet utilizing smaller more frequent meals to increase metabolism.  I have also recommended that patient start exercising with a goal of 30 minutes of aerobic exercise a minimum of 5 days per week. Screening for lipid disorders, thyroid and diabetes to be done         Relevant Orders   Ambulatory referral to Gastroenterology   Asthma, moderate persistent    Managed by [facility]  No recent exacerbations,  Often triggered by sinus infections. Managed with Advair,  Allegra, singlair and dymista.       Relevant Medications   fluticasone-salmeterol (ADVAIR HFA) 115-21 MCG/ACT inhaler   montelukast (SINGULAIR) 10 MG tablet   ALBUTEROL SULFATE HFA IN   Other Relevant Orders   CBC with Differential/Platelet   Cognitive changes    Referral to [facility] , MRI/MRA ordered given Fh of cerebral  aneurysm      Encounter for preventive health examination    Annual wellness  exam was done as well as a comprehensive physical exam and management of acute and chronic conditions .  During the course of the visit the patient was educated and counseled about appropriate screening and preventive services including :  diabetes screening, lipid analysis with projected  10 year  risk for CAD , nutrition counseling, colorectal cancer screening, and recommended immunizations.  Printed recommendations for health maintenance screenings was given.        Other Visit Diagnoses    Breast cancer screening    -  Primary    Relevant Orders    MM DIGITAL SCREENING BILATERAL    Hyperlipidemia        Relevant Orders    Lipid panel    Lipid  panel    Cognitive communication deficit        Relevant Orders    Comprehensive metabolic panel    TSH    Vitamin B12    RPR    MR MRA Head/Brain Wo Cm    Chronic nonintractable headache, unspecified headache type        Relevant Medications    oxyCODONE (OXY IR/ROXICODONE) 5 MG immediate release tablet    Other Relevant Orders    CBC with Differential/Platelet    Ambulatory referral to Neurology    FH: cerebral aneurysm        Relevant Orders    MR MRA Head/Brain Wo Cm    Ambulatory referral to Neurology    Screen for colon cancer        Relevant Orders    Ambulatory referral to Gastroenterology    Need for hepatitis C screening test        Relevant Orders    Hepatitis C antibody    Vitamin D deficiency        Relevant Orders    Vit D  25 hydroxy (rtn osteoporosis monitoring)

## 2014-06-21 DIAGNOSIS — R4189 Other symptoms and signs involving cognitive functions and awareness: Secondary | ICD-10-CM | POA: Insufficient documentation

## 2014-06-21 DIAGNOSIS — Z0000 Encounter for general adult medical examination without abnormal findings: Secondary | ICD-10-CM | POA: Insufficient documentation

## 2014-06-21 NOTE — Assessment & Plan Note (Signed)
Repeat colonoscopy was due 2015 but not done.  Referral to [facility] in process.

## 2014-06-21 NOTE — Assessment & Plan Note (Signed)
BP 132/80 mmHg  Pulse 79  Temp(Src) 98.4 F (36.9 C) (Oral)  Resp 16  Ht 5' 3.25" (1.607 m)  Wt 154 lb 8 oz (70.081 kg)  BMI 27.14 kg/m2  SpO2 98%  LMP 02/15/2003  Well controlled on current regimen. Renal function  Is due  Lab Results  Component Value Date   CREATININE 0.6 06/13/2013

## 2014-06-21 NOTE — Assessment & Plan Note (Signed)
Body mass index is 27.14 kg/(m^2). I have addressed  BMI and recommended a low glycemic index diet utilizing smaller more frequent meals to increase metabolism.  I have also recommended that patient start exercising with a goal of 30 minutes of aerobic exercise a minimum of 5 days per week. Screening for lipid disorders, thyroid and diabetes to be done

## 2014-06-21 NOTE — Assessment & Plan Note (Signed)

## 2014-06-21 NOTE — Assessment & Plan Note (Signed)
Referral to [facility] , MRI/MRA ordered given Fh of cerebral aneurysm

## 2014-06-21 NOTE — Assessment & Plan Note (Addendum)
Managed by [facility]..  No recent exacerbations,  Often triggered by sinus infections. Managed with Advair,  Allegra, singlair and dymista.

## 2014-06-21 NOTE — Assessment & Plan Note (Signed)
Occurring rarely since menopause

## 2014-06-26 ENCOUNTER — Ambulatory Visit (INDEPENDENT_AMBULATORY_CARE_PROVIDER_SITE_OTHER): Payer: BLUE CROSS/BLUE SHIELD | Admitting: Obstetrics and Gynecology

## 2014-06-26 ENCOUNTER — Encounter: Payer: Self-pay | Admitting: Obstetrics and Gynecology

## 2014-06-26 ENCOUNTER — Ambulatory Visit: Payer: BC Managed Care – PPO | Admitting: Obstetrics and Gynecology

## 2014-06-26 VITALS — BP 138/88 | HR 84 | Resp 16 | Ht 63.5 in | Wt 152.0 lb

## 2014-06-26 DIAGNOSIS — Z780 Asymptomatic menopausal state: Secondary | ICD-10-CM | POA: Diagnosis not present

## 2014-06-26 DIAGNOSIS — Z01419 Encounter for gynecological examination (general) (routine) without abnormal findings: Secondary | ICD-10-CM | POA: Diagnosis not present

## 2014-06-26 MED ORDER — PROGESTERONE MICRONIZED 100 MG PO CAPS: 100.0000 mg | ORAL_CAPSULE | Freq: Every day | ORAL | Status: DC

## 2014-06-26 MED ORDER — ESTRADIOL 0.025 MG/24HR TD PTTW: MEDICATED_PATCH | TRANSDERMAL | Status: DC

## 2014-06-26 NOTE — Patient Instructions (Signed)

## 2014-06-26 NOTE — Progress Notes (Signed)
59 y.o. G66P1031 Married Caucasian female here for annual exam.    Will be having brain MRI for language difficulties.  Mother with history of cerebral hemorrhage.  On Vivelle Dot and Prometrium.  Controlled hot flashes well.   PCP:   Deborra Medina  Patient's last menstrual period was 02/15/2003.          Sexually active: Yes.    The current method of family planning is post menopausal status.    Exercising: No.  The patient does not participate in regular exercise at present. Smoker:  no  Health Maintenance: Pap: 06/12/2012 Neg. HR HPV:Neg History of abnormal Pap:  Yes, Leep 1991 MMG: 05/29/13 BIRADS1:Neg.  Scheduled for 2 weeks.  Colonoscopy: 01/2013 - repeat 3 years. BMD:   Never  TDaP:  2015 Screening Labs:  Hb today: PCP, Urine today: PCP   reports that she has never smoked. She has never used smokeless tobacco. She reports that she does not drink alcohol or use illicit drugs.  Past Medical History  Diagnosis Date  . Hypertension   . Asthma   . Migraine, menstrual     resolved with menopause  . Raynaud phenomenon   . Facial paralysis/Bells palsy     6 week paralysis of right side of face   . Fibroid   . Cervical disc herniation   . History of colon polyps     adenomatous  . History of abnormal cervical Pap smear     Past Surgical History  Procedure Laterality Date  . Nasal sinus surgery    . Cervical cone biopsy  1985  . Cervical biopsy  w/ loop electrode excision  1991  . Cervical disc surgery  11/2012    --removed bone spurs, bulging disc  . Colposcopy  1984    no treatment  . Finger surgery Left 06/2014    Left index finger.     Current Outpatient Prescriptions  Medication Sig Dispense Refill  . ALBUTEROL SULFATE HFA IN Inhale 2 puffs into the lungs every 6 (six) hours as needed.    . Azelastine-Fluticasone (DYMISTA) 137-50 MCG/ACT SUSP Place 1 spray into both nostrils daily.    . Calcium Carbonate (CALTRATE 600 PO) Take 1 tablet by mouth daily.    Marland Kitchen  esomeprazole (NEXIUM) 40 MG capsule Take 40 mg by mouth daily at 12 noon.    Marland Kitchen estradiol (VIVELLE-DOT) 0.025 MG/24HR Place 1 patch onto the skin 2 (two) times a week. 24 patch 3  . fluticasone-salmeterol (ADVAIR HFA) 115-21 MCG/ACT inhaler Inhale 2 puffs into the lungs 2 (two) times daily.    . montelukast (SINGULAIR) 10 MG tablet Take 1 tablet by mouth at bedtime.    . Multiple Vitamin (MULTIVITAMIN) capsule Take 1 capsule by mouth daily.    Marland Kitchen PRESCRIPTION MEDICATION Allergy Shots weekly    . progesterone (PROMETRIUM) 100 MG capsule Take 1 capsule (100 mg total) by mouth daily. 90 capsule 3  . telmisartan (MICARDIS) 40 MG tablet TAKE ONE TABLET EVERY DAY 90 tablet 2  . vitamin B-12 (CYANOCOBALAMIN) 100 MCG tablet Take 100 mcg by mouth daily.    Marland Kitchen EPIPEN 2-PAK 0.3 MG/0.3ML SOAJ      No current facility-administered medications for this visit.    Family History  Problem Relation Age of Onset  . Celiac disease Daughter   . Hypertension Mother   . Stroke Mother 69    cerebral aneurysm  . Heart disease Father     CABG  . COPD Father 44  .  Diabetes Father   . Heart failure Father   . Heart attack Father 17  . Breast cancer Maternal Grandmother 61    dec 89   . Diabetes Paternal Grandfather   . Heart attack Paternal Grandfather   . Heart disease Paternal Grandfather   . Heart failure Paternal Grandfather   . Heart disease Paternal Uncle     ROS:  Pertinent items are noted in HPI.  Otherwise, a comprehensive ROS was negative.  Exam:   BP 138/88 mmHg  Pulse 84  Resp 16  Ht 5' 3.5" (1.613 m)  Wt 152 lb (68.947 kg)  BMI 26.50 kg/m2  LMP 02/15/2003    General appearance: alert, cooperative and appears stated age Head: Normocephalic, without obvious abnormality, atraumatic Neck: no adenopathy, supple, symmetrical, trachea midline and thyroid normal to inspection and palpation Lungs: clear to auscultation bilaterally Breasts: normal appearance, no masses or tenderness, Inspection  negative, No nipple retraction or dimpling, No nipple discharge or bleeding, No axillary or supraclavicular adenopathy Heart: regular rate and rhythm Abdomen: soft, non-tender; bowel sounds normal; no masses,  no organomegaly Extremities: extremities normal, atraumatic, no cyanosis or edema Skin: Skin color, texture, turgor normal. No rashes or lesions Lymph nodes: Cervical, supraclavicular, and axillary nodes normal. No abnormal inguinal nodes palpated Neurologic: Grossly normal  Pelvic: External genitalia:  no lesions              Urethra:  normal appearing urethra with no masses, tenderness or lesions              Bartholins and Skenes: normal                 Vagina: normal appearing vagina with normal color and discharge, no lesions              Cervix: no lesions and consistent with prior LEEP.              Pap taken: Yes.   Bimanual Exam:  Uterus:  normal size, contour, position, consistency, mobility, non-tender              Adnexa: normal adnexa and no mass, fullness, tenderness              Rectovaginal: Yes.  .  Confirms.              Anus:  normal sphincter tone, no lesions  Chaperone was present for exam.  Assessment:   Well woman visit with normal exam. Remote hx of cervical dysplasia. HRT patient.   Plan: Yearly mammogram recommended after age 35.  Recommended self breast exam.  Pap and HR HPV as above. Discussed Calcium, Vitamin D, regular exercise program including cardiovascular and weight bearing exercise. Labs performed.  No..     Refills given on medications.  Yes.  .  See orders. Vivelle Dot and Prometrium.  Will reduce Vivelle to 1/2 patch twice weekly.  Discussed risks of breast cancer, DVT, PE, MI, stroke.  Wishes to continue.  Follow up annually and prn.    After visit summary provided.

## 2014-06-30 LAB — IPS PAP TEST WITH HPV

## 2014-07-04 ENCOUNTER — Other Ambulatory Visit (INDEPENDENT_AMBULATORY_CARE_PROVIDER_SITE_OTHER): Payer: BLUE CROSS/BLUE SHIELD

## 2014-07-04 DIAGNOSIS — E785 Hyperlipidemia, unspecified: Secondary | ICD-10-CM | POA: Diagnosis not present

## 2014-07-04 DIAGNOSIS — J4540 Moderate persistent asthma, uncomplicated: Secondary | ICD-10-CM

## 2014-07-04 DIAGNOSIS — E559 Vitamin D deficiency, unspecified: Secondary | ICD-10-CM

## 2014-07-04 DIAGNOSIS — R7989 Other specified abnormal findings of blood chemistry: Secondary | ICD-10-CM | POA: Diagnosis not present

## 2014-07-04 DIAGNOSIS — R41841 Cognitive communication deficit: Secondary | ICD-10-CM | POA: Diagnosis not present

## 2014-07-04 DIAGNOSIS — Z1159 Encounter for screening for other viral diseases: Secondary | ICD-10-CM

## 2014-07-04 LAB — COMPREHENSIVE METABOLIC PANEL
ALT: 24 U/L (ref 0–35)
AST: 20 U/L (ref 0–37)
Albumin: 4.3 g/dL (ref 3.5–5.2)
Alkaline Phosphatase: 74 U/L (ref 39–117)
BILIRUBIN TOTAL: 0.9 mg/dL (ref 0.2–1.2)
BUN: 10 mg/dL (ref 6–23)
CO2: 29 meq/L (ref 19–32)
Calcium: 9.6 mg/dL (ref 8.4–10.5)
Chloride: 104 mEq/L (ref 96–112)
Creatinine, Ser: 0.66 mg/dL (ref 0.40–1.20)
GFR: 97.08 mL/min (ref >60.00)
GLUCOSE: 92 mg/dL (ref 70–99)
POTASSIUM: 4.4 meq/L (ref 3.5–5.1)
SODIUM: 141 meq/L (ref 135–145)
Total Protein: 7 g/dL (ref 6.0–8.3)

## 2014-07-04 LAB — LIPID PANEL
CHOLESTEROL: 210 mg/dL — AB (ref 0–200)
HDL: 53.2 mg/dL (ref >39.00)
NonHDL: 156.8
TRIGLYCERIDES: 201 mg/dL — AB (ref 0.0–149.0)
Total CHOL/HDL Ratio: 4
VLDL: 40.2 mg/dL — ABNORMAL HIGH (ref 0.0–40.0)

## 2014-07-04 LAB — CBC WITH DIFFERENTIAL/PLATELET
Basophils Absolute: 0 10*3/uL (ref 0.0–0.1)
Basophils Relative: 0.4 % (ref 0.0–3.0)
Eosinophils Absolute: 0.1 10*3/uL (ref 0.0–0.7)
Eosinophils Relative: 1 % (ref 0.0–5.0)
HCT: 44.5 % (ref 36.0–46.0)
Hemoglobin: 15.3 g/dL — ABNORMAL HIGH (ref 12.0–15.0)
LYMPHS PCT: 26.9 % (ref 12.0–46.0)
Lymphs Abs: 1.8 10*3/uL (ref 0.7–4.0)
MCHC: 34.4 g/dL (ref 30.0–36.0)
MCV: 88.8 fl (ref 78.0–100.0)
MONO ABS: 0.5 10*3/uL (ref 0.1–1.0)
Monocytes Relative: 7.4 % (ref 3.0–12.0)
Neutro Abs: 4.2 10*3/uL (ref 1.4–7.7)
Neutrophils Relative %: 64.3 % (ref 43.0–77.0)
PLATELETS: 349 10*3/uL (ref 150.0–400.0)
RBC: 5.02 Mil/uL (ref 3.87–5.11)
RDW: 13.1 % (ref 11.5–15.5)
WBC: 6.5 10*3/uL (ref 4.0–10.5)

## 2014-07-04 LAB — TSH: TSH: 1.3 u[IU]/mL (ref 0.35–4.50)

## 2014-07-04 LAB — LDL CHOLESTEROL, DIRECT: LDL DIRECT: 137 mg/dL

## 2014-07-04 LAB — VITAMIN D 25 HYDROXY (VIT D DEFICIENCY, FRACTURES): VITD: 22.48 ng/mL — ABNORMAL LOW (ref 30.00–100.00)

## 2014-07-05 LAB — HEPATITIS C ANTIBODY: HCV Ab: NEGATIVE

## 2014-07-05 LAB — RPR

## 2014-07-07 ENCOUNTER — Other Ambulatory Visit: Payer: Self-pay | Admitting: Internal Medicine

## 2014-07-07 ENCOUNTER — Ambulatory Visit
Admission: RE | Admit: 2014-07-07 | Discharge: 2014-07-07 | Disposition: A | Discharge status: Home / Self Care | Payer: BLUE CROSS/BLUE SHIELD | Source: Ambulatory Visit | Attending: Internal Medicine | Admitting: Internal Medicine

## 2014-07-07 ENCOUNTER — Encounter: Payer: Self-pay | Admitting: Internal Medicine

## 2014-07-07 DIAGNOSIS — Z1231 Encounter for screening mammogram for malignant neoplasm of breast: Secondary | ICD-10-CM | POA: Insufficient documentation

## 2014-07-07 DIAGNOSIS — Z1239 Encounter for other screening for malignant neoplasm of breast: Secondary | ICD-10-CM

## 2014-07-07 MED ORDER — ERGOCALCIFEROL 1.25 MG (50000 UT) PO CAPS: 50000.0000 [IU] | ORAL_CAPSULE | ORAL | Status: DC

## 2014-07-09 ENCOUNTER — Encounter: Payer: Self-pay | Admitting: Internal Medicine

## 2014-07-10 ENCOUNTER — Encounter: Payer: Self-pay | Admitting: Nurse Practitioner

## 2014-07-10 ENCOUNTER — Ambulatory Visit (INDEPENDENT_AMBULATORY_CARE_PROVIDER_SITE_OTHER): Payer: BLUE CROSS/BLUE SHIELD | Admitting: Nurse Practitioner

## 2014-07-10 VITALS — BP 118/78 | HR 86 | Temp 98.0°F | Resp 16 | Ht 63.5 in | Wt 151.8 lb

## 2014-07-10 DIAGNOSIS — R300 Dysuria: Secondary | ICD-10-CM | POA: Diagnosis not present

## 2014-07-10 LAB — POCT URINALYSIS DIPSTICK
BILIRUBIN UA: NEGATIVE
GLUCOSE UA: NEGATIVE
Ketones, UA: NEGATIVE
Nitrite, UA: NEGATIVE
Protein, UA: NEGATIVE
Spec Grav, UA: 1.01
UROBILINOGEN UA: 0.2
pH, UA: 6

## 2014-07-10 MED ORDER — SULFAMETHOXAZOLE-TRIMETHOPRIM 800-160 MG PO TABS: 1.0000 | ORAL_TABLET | Freq: Two times a day (BID) | ORAL | Status: DC

## 2014-07-10 NOTE — Progress Notes (Signed)
   Subjective:    Patient ID: Jean Luna, female    DOB: [birth year], 59 y.o.   MRN: [id number]  HPI  Ms. Digiulio is a 59 yo female with CC of dysuria x 2 days.    1) Last night discomfort, frequency, tinge of pink x 2 occasions with wiping, burning, and back ache (this morning)  Took a doxycyline last night   Drinking more water   Review of Systems  Constitutional: Negative for fever, chills, diaphoresis and fatigue.  Respiratory: Negative for chest tightness, shortness of breath and wheezing.   Cardiovascular: Negative for chest pain, palpitations and leg swelling.  Gastrointestinal: Negative for nausea, vomiting and diarrhea.  Genitourinary: Positive for dysuria, urgency, frequency, hematuria and flank pain. Negative for decreased urine volume, difficulty urinating and pelvic pain.  Musculoskeletal: Positive for back pain. Negative for neck pain.       Low back pain  Skin: Negative for rash.  Neurological: Negative for dizziness, weakness, numbness and headaches.  Psychiatric/Behavioral: The patient is not nervous/anxious.        Objective:   Physical Exam  Constitutional: She is oriented to person, place, and time. She appears well-developed and well-nourished. No distress.  BP 118/78 mmHg  Pulse 86  Temp(Src) 98 F (36.7 C)  Resp 16  Ht 5' 3.5" (1.613 m)  Wt 151 lb 12.8 oz (68.856 kg)  BMI 26.47 kg/m2  SpO2 96%  LMP 02/15/2003   HENT:  Head: Normocephalic and atraumatic.  Right Ear: External ear normal.  Left Ear: External ear normal.  Cardiovascular: Normal rate, regular rhythm and normal heart sounds.  Exam reveals no gallop and no friction rub.   No murmur heard. Pulmonary/Chest: Effort normal and breath sounds normal. No respiratory distress. She has no wheezes. She has no rales. She exhibits no tenderness.  Abdominal: There is no CVA tenderness.  Neurological: She is alert and oriented to person, place, and time. No cranial nerve deficit. She  exhibits normal muscle tone. Coordination normal.  Skin: Skin is warm and dry. No rash noted. She is not diaphoretic.  Psychiatric: She has a normal mood and affect. Her behavior is normal. Judgment and thought content normal.      Assessment & Plan:

## 2014-07-10 NOTE — Patient Instructions (Signed)
Please take a probiotic ( Align, Floraque or Culturelle) while you are on the antibiotic to prevent a serious antibiotic associated diarrhea  Called clostirudium dificile colitis and a vaginal yeast infection.  Take the Bactrim as directed.   Call if symptoms worsen or do not improve after treatment.

## 2014-07-10 NOTE — Assessment & Plan Note (Addendum)
Pt had hematuria (pink on tissue x 2 episodes). POCT urine reveals probable UTI, will obtain culture. Bactrim DS twice daily for 5 days. Encouraged probiotics.  FU prn worsening/failure to improve.

## 2014-07-10 NOTE — Progress Notes (Signed)
Pre visit review using our clinic review tool, if applicable. No additional management support is needed unless otherwise documented below in the visit note. 

## 2014-07-12 LAB — URINE CULTURE
Colony Count: NO GROWTH
Organism ID, Bacteria: NO GROWTH

## 2014-07-17 ENCOUNTER — Telehealth: Payer: Self-pay | Admitting: Internal Medicine

## 2014-07-17 DIAGNOSIS — R4189 Other symptoms and signs involving cognitive functions and awareness: Secondary | ICD-10-CM

## 2014-07-17 NOTE — Assessment & Plan Note (Signed)
Normal MRA brain 07/15/14

## 2014-07-17 NOTE — Telephone Encounter (Signed)
Mychart message sent.

## 2014-07-25 NOTE — Telephone Encounter (Signed)
Mailed letter °

## 2014-08-01 NOTE — Telephone Encounter (Signed)
Mailed unread message to pt  

## 2015-03-08 ENCOUNTER — Other Ambulatory Visit: Payer: Self-pay | Admitting: Internal Medicine

## 2015-03-08 DIAGNOSIS — B9789 Other viral agents as the cause of diseases classified elsewhere: Principal | ICD-10-CM

## 2015-03-08 DIAGNOSIS — J069 Acute upper respiratory infection, unspecified: Secondary | ICD-10-CM | POA: Insufficient documentation

## 2015-03-08 MED ORDER — BENZONATATE 200 MG PO CAPS: 200.0000 mg | ORAL_CAPSULE | Freq: Three times a day (TID) | ORAL | Status: DC | PRN

## 2015-03-08 MED ORDER — PREDNISONE 10 MG PO TABS: ORAL_TABLET | ORAL | Status: DC

## 2015-03-09 ENCOUNTER — Other Ambulatory Visit: Payer: Self-pay | Admitting: Internal Medicine

## 2015-04-13 ENCOUNTER — Other Ambulatory Visit: Payer: Self-pay | Admitting: Internal Medicine

## 2015-04-13 DIAGNOSIS — Z1159 Encounter for screening for other viral diseases: Secondary | ICD-10-CM

## 2015-04-13 DIAGNOSIS — R5383 Other fatigue: Secondary | ICD-10-CM

## 2015-04-13 DIAGNOSIS — E559 Vitamin D deficiency, unspecified: Secondary | ICD-10-CM

## 2015-04-13 DIAGNOSIS — I10 Essential (primary) hypertension: Secondary | ICD-10-CM

## 2015-04-13 NOTE — Telephone Encounter (Signed)
REFILLED,  BUT [name] needs fasting labs done ASAP.  labs ordered. please call patient

## 2015-04-13 NOTE — Telephone Encounter (Signed)
Last OV 5/16 ok to fill?

## 2015-04-21 ENCOUNTER — Telehealth: Payer: Self-pay | Admitting: Internal Medicine

## 2015-04-21 NOTE — Telephone Encounter (Signed)
My Chart message sent

## 2015-05-21 DIAGNOSIS — J301 Allergic rhinitis due to pollen: Secondary | ICD-10-CM | POA: Diagnosis not present

## 2015-05-26 DIAGNOSIS — J301 Allergic rhinitis due to pollen: Secondary | ICD-10-CM | POA: Diagnosis not present

## 2015-05-27 DIAGNOSIS — J301 Allergic rhinitis due to pollen: Secondary | ICD-10-CM | POA: Diagnosis not present

## 2015-06-04 DIAGNOSIS — J301 Allergic rhinitis due to pollen: Secondary | ICD-10-CM | POA: Diagnosis not present

## 2015-06-08 ENCOUNTER — Other Ambulatory Visit: Payer: Self-pay | Admitting: Internal Medicine

## 2015-06-08 DIAGNOSIS — Z1231 Encounter for screening mammogram for malignant neoplasm of breast: Secondary | ICD-10-CM

## 2015-06-10 DIAGNOSIS — J301 Allergic rhinitis due to pollen: Secondary | ICD-10-CM | POA: Diagnosis not present

## 2015-06-15 DIAGNOSIS — J301 Allergic rhinitis due to pollen: Secondary | ICD-10-CM | POA: Diagnosis not present

## 2015-06-19 ENCOUNTER — Ambulatory Visit (INDEPENDENT_AMBULATORY_CARE_PROVIDER_SITE_OTHER): Payer: BLUE CROSS/BLUE SHIELD | Admitting: Internal Medicine

## 2015-06-19 ENCOUNTER — Encounter: Payer: Self-pay | Admitting: Internal Medicine

## 2015-06-19 VITALS — BP 124/80 | HR 68 | Temp 98.4°F | Resp 12 | Ht 63.25 in | Wt 152.5 lb

## 2015-06-19 DIAGNOSIS — Z1159 Encounter for screening for other viral diseases: Secondary | ICD-10-CM | POA: Diagnosis not present

## 2015-06-19 DIAGNOSIS — Z0000 Encounter for general adult medical examination without abnormal findings: Secondary | ICD-10-CM

## 2015-06-19 DIAGNOSIS — Z23 Encounter for immunization: Secondary | ICD-10-CM | POA: Diagnosis not present

## 2015-06-19 DIAGNOSIS — R0602 Shortness of breath: Secondary | ICD-10-CM

## 2015-06-19 DIAGNOSIS — E559 Vitamin D deficiency, unspecified: Secondary | ICD-10-CM

## 2015-06-19 DIAGNOSIS — R5383 Other fatigue: Secondary | ICD-10-CM | POA: Diagnosis not present

## 2015-06-19 DIAGNOSIS — E538 Deficiency of other specified B group vitamins: Secondary | ICD-10-CM

## 2015-06-19 DIAGNOSIS — I10 Essential (primary) hypertension: Secondary | ICD-10-CM | POA: Diagnosis not present

## 2015-06-19 DIAGNOSIS — Z8249 Family history of ischemic heart disease and other diseases of the circulatory system: Secondary | ICD-10-CM

## 2015-06-19 DIAGNOSIS — Z113 Encounter for screening for infections with a predominantly sexual mode of transmission: Secondary | ICD-10-CM

## 2015-06-19 DIAGNOSIS — R140 Abdominal distension (gaseous): Secondary | ICD-10-CM | POA: Diagnosis not present

## 2015-06-19 LAB — COMPREHENSIVE METABOLIC PANEL
ALBUMIN: 4.5 g/dL (ref 3.5–5.2)
ALT: 18 U/L (ref 0–35)
AST: 21 U/L (ref 0–37)
Alkaline Phosphatase: 62 U/L (ref 39–117)
BUN: 13 mg/dL (ref 6–23)
CALCIUM: 9.3 mg/dL (ref 8.4–10.5)
CHLORIDE: 105 meq/L (ref 96–112)
CO2: 28 mEq/L (ref 19–32)
CREATININE: 0.63 mg/dL (ref 0.40–1.20)
GFR: 102.1 mL/min (ref >60.00)
Glucose, Bld: 88 mg/dL (ref 70–99)
POTASSIUM: 4 meq/L (ref 3.5–5.1)
Sodium: 142 mEq/L (ref 135–145)
Total Bilirubin: 0.7 mg/dL (ref 0.2–1.2)
Total Protein: 6.5 g/dL (ref 6.0–8.3)

## 2015-06-19 LAB — LIPID PANEL
Cholesterol: 175 mg/dL (ref 0–200)
HDL: 48.7 mg/dL
LDL Cholesterol: 105 mg/dL — ABNORMAL HIGH (ref 0–99)
NonHDL: 126.44
Total CHOL/HDL Ratio: 4
Triglycerides: 109 mg/dL (ref 0.0–149.0)
VLDL: 21.8 mg/dL (ref 0.0–40.0)

## 2015-06-19 LAB — TSH: TSH: 1.31 u[IU]/mL (ref 0.35–4.50)

## 2015-06-19 LAB — VITAMIN D 25 HYDROXY (VIT D DEFICIENCY, FRACTURES): VITD: 28.81 ng/mL — ABNORMAL LOW (ref 30.00–100.00)

## 2015-06-19 NOTE — Patient Instructions (Signed)

## 2015-06-19 NOTE — Progress Notes (Signed)
Pre-visit discussion using our clinic review tool. No additional management support is needed unless otherwise documented below in the visit note.  

## 2015-06-19 NOTE — Progress Notes (Signed)
Patient ID: Jean Luna, female    DOB: June 02, 1955  Age: 60 y.o. MRN: [id number]  The patient is here for annual Medicare wellness examination and management of other chronic and acute problems.    Las pap normal 2016 by Josefa Half  history of abnormal PAPs.  colonoscopy due Dec 2017 due for polyps Mammogram due    The risk factors are  reflected in the social history.  The roster of all physicians providing medical care to patient - is listed in the Snapshot section of the chart.  Activities of daily living:  The patient is 100% independent in all ADLs: dressing, toileting, feeding as well as independent mobility  Home safety : The patient has smoke detectors in the home. They wear seatbelts.  There are no firearms at home. There is no violence in the home.   There is no risks for hepatitis, STDs or HIV. There is no   history of blood transfusion. They have no travel history to infectious disease endemic areas of the world.  The patient has seen their dentist in the last six month. They have seen their eye doctor in the last year. They admit to slight hearing difficulty with regard to whispered voices and some television programs.  They have deferred audiologic testing in the last year.  They do not  have excessive sun exposure. Discussed the need for sun protection: hats, long sleeves and use of sunscreen if there is significant sun exposure.   Diet: the importance of a healthy diet is discussed. They do have a healthy diet.  The benefits of regular aerobic exercise were discussed. She does not exercise regularly but has joined [employer].   Depression screen: there are no signs or vegative symptoms of depression- irritability, change in appetite, anhedonia, sadness/tearfullness.  Cognitive assessment: the patient manages all their financial and personal affairs and is actively engaged. They could relate day,date,year and events; recalled 2/3 objects at 3 minutes; performed  clock-face test normally.  The following portions of the patient's history were reviewed and updated as appropriate: allergies, current medications, past family history, past medical history,  past surgical history, past social history  and problem list.  Visual acuity was not assessed per patient preference since she has regular follow up with her ophthalmologist. Hearing and body mass index were assessed and reviewed.   During the course of the visit the patient was educated and counseled about appropriate screening and preventive services including : fall prevention , diabetes screening, nutrition counseling, colorectal cancer screening, and recommended immunizations.    CC: The primary encounter diagnosis was Screen for STD (sexually transmitted disease). Diagnoses of Abdominal bloating, Essential hypertension, Other fatigue, Need for hepatitis C screening test, Vitamin D deficiency, Need for prophylactic vaccination against Streptococcus pneumoniae (pneumococcus), Shortness of breath, Family history of early CAD, Vitamin B12 deficiency without anemia, and Routine general medical examination at a health care facility were also pertinent to this visit.  Patient requesting evaluation with cardiac CT Strong FH of CAD Abdominal bloating without constipation . Discussed CA 125,  Gyn to do ultrasound  History of asthma, not using advair for asthma daily.     History Angy has a past medical history of Hypertension; Asthma; Migraine, menstrual; Raynaud phenomenon; Facial paralysis/Bells palsy; Fibroid; Cervical disc herniation; History of colon polyps; and History of abnormal cervical Pap smear.   She has past surgical history that includes Nasal sinus surgery; Cervical cone biopsy (1985); Cervical biopsy w/ loop electrode excision (1991); Cervical  disc surgery (11/2012); Colposcopy (1984); and Finger surgery (Left, 06/2014).   Her family history includes Breast cancer (age of onset: 25) in her  maternal grandmother; COPD (age of onset: 27) in her father; Celiac disease in her daughter; Diabetes in her father and paternal grandfather; Heart attack in her paternal grandfather; Heart attack (age of onset: 3) in her father; Heart disease in her father, paternal grandfather, and paternal uncle; Heart failure in her father and paternal grandfather; Hypertension in her mother; Stroke (age of onset: 59) in her mother.She reports that she has never smoked. She has never used smokeless tobacco. She reports that she does not drink alcohol or use illicit drugs.  Outpatient Prescriptions Prior to Visit  Medication Sig Dispense Refill  . ALBUTEROL SULFATE HFA IN Inhale 2 puffs into the lungs every 6 (six) hours as needed.    . Azelastine-Fluticasone (DYMISTA) 137-50 MCG/ACT SUSP Place 1 spray into both nostrils daily.    . Calcium Carbonate (CALTRATE 600 PO) Take 1 tablet by mouth daily.    Marland Kitchen EPIPEN 2-PAK 0.3 MG/0.3ML SOAJ     . esomeprazole (NEXIUM) 40 MG capsule Take 40 mg by mouth daily at 12 noon. Reported on 06/19/2015    . estradiol (VIVELLE-DOT) 0.025 MG/24HR Place once half patch to skin twice a week. 12 patch 3  . fluticasone-salmeterol (ADVAIR HFA) 115-21 MCG/ACT inhaler Inhale 2 puffs into the lungs 2 (two) times daily.    . Multiple Vitamin (MULTIVITAMIN) capsule Take 1 capsule by mouth daily.    Marland Kitchen PRESCRIPTION MEDICATION Allergy Shots weekly    . progesterone (PROMETRIUM) 100 MG capsule Take 1 capsule (100 mg total) by mouth daily. 90 capsule 3  . telmisartan (MICARDIS) 40 MG tablet TAKE ONE TABLET BY MOUTH EVERY DAY ( NEED APPT FOR FURTHER REFILLS) 90 tablet 0  . vitamin B-12 (CYANOCOBALAMIN) 100 MCG tablet Take 100 mcg by mouth daily.    . ergocalciferol (DRISDOL) 50000 UNITS capsule Take 1 capsule (50,000 Units total) by mouth once a week. (Patient not taking: Reported on 06/19/2015) 12 capsule 0  . montelukast (SINGULAIR) 10 MG tablet Take 1 tablet by mouth at bedtime.    . predniSONE  (DELTASONE) 10 MG tablet 6 tablets on Day 1 , then reduce by 1 tablet daily until gone (Patient not taking: Reported on 06/19/2015) 21 tablet 0  . sulfamethoxazole-trimethoprim (BACTRIM DS,SEPTRA DS) 800-160 MG per tablet Take 1 tablet by mouth 2 (two) times daily. (Patient not taking: Reported on 06/19/2015) 10 tablet 0  . benzonatate (TESSALON) 200 MG capsule Take 1 capsule (200 mg total) by mouth 3 (three) times daily as needed for cough. (Patient not taking: Reported on 06/19/2015) 60 capsule 1   No facility-administered medications prior to visit.    Review of Systems   Patient denies headache, fevers, malaise, unintentional weight loss, skin rash, eye pain, sinus congestion and sinus pain, sore throat, dysphagia,  hemoptysis , cough, dyspnea, wheezing, chest pain, palpitations, orthopnea, edema, abdominal pain, nausea, melena, diarrhea, constipation, flank pain, dysuria, hematuria, urinary  Frequency, nocturia, numbness, tingling, seizures,  Focal weakness, Loss of consciousness,  Tremor, insomnia, depression, anxiety, and suicidal ideation.     Objective:  BP 124/80 mmHg  Pulse 68  Temp(Src) 98.4 F (36.9 C) (Oral)  Resp 12  Ht 5' 3.25" (1.607 m)  Wt 152 lb 8 oz (69.174 kg)  BMI 26.79 kg/m2  SpO2 98%  LMP 02/15/2003  Physical Exam   General appearance: alert, cooperative and appears stated age Head:  Normocephalic, without obvious abnormality, atraumatic Eyes: conjunctivae/corneas clear. PERRL, EOM's intact. Fundi benign. Ears: normal TM's and external ear canals both ears Nose: Nares normal. Septum midline. Mucosa normal. No drainage or sinus tenderness. Throat: lips, mucosa, and tongue normal; teeth and gums normal Neck: no adenopathy, no carotid bruit, no JVD, supple, symmetrical, trachea midline and thyroid not enlarged, symmetric, no tenderness/mass/nodules Lungs: clear to auscultation bilaterally Breasts: normal appearance, no masses or tenderness Heart: regular rate and  rhythm, S1, S2 normal, no murmur, click, rub or gallop Abdomen: soft, non-tender; bowel sounds normal; no masses,  no organomegaly Extremities: extremities normal, atraumatic, no cyanosis or edema Pulses: 2+ and symmetric Skin: Skin color, texture, turgor normal. No rashes or lesions Neurologic: Alert and oriented X 3, normal strength and tone. Normal symmetric reflexes. Normal coordination and gait.    Assessment & Plan:   Problem List Items Addressed This Visit    Routine general medical examination at a health care facility    Annual comprehensive preventive exam was done as well as an evaluation and management of chronic conditions .  During the course of the visit the patient was educated and counseled about appropriate screening and preventive services including :  diabetes screening, lipid analysis with projected  10 year  risk for CAD , nutrition counseling, breast, cervical and colorectal cancer screening, and recommended immunizations.  Printed recommendations for health maintenance screenings was given.  Lab Results  Component Value Date   CHOL 175 06/19/2015   HDL 48.70 06/19/2015   LDLCALC 105* 06/19/2015   LDLDIRECT 137.0 07/04/2014   TRIG 109.0 06/19/2015   CHOLHDL 4 06/19/2015         Vitamin B12 deficiency without anemia    Her b12 level has not been rechecked in two years, but she is taking an oral  supplement.       Shortness of breath    She has a strong FH of CAD and is requesting evaluation with a cardiac CT.  Referral to cardiology      Relevant Orders   Ambulatory referral to Cardiology   Essential hypertension    Well controlled on current regimen. Renal function stable, no changes today.  Lab Results  Component Value Date   CREATININE 0.63 06/19/2015   Lab Results  Component Value Date   NA 142 06/19/2015   K 4.0 06/19/2015   CL 105 06/19/2015   CO2 28 06/19/2015         Vitamin D deficiency    Recurrent.  Will resume weekly megadose for  one month RESUME  2000 IUs daily       Abdominal bloating    She has no history of constipation, but has pelvic fullness and is post menopausal.  CA 125 and pelvic ultrasound to be done,       Relevant Orders   CA 125 (Completed)    Other Visit Diagnoses    Screen for STD (sexually transmitted disease)    -  Primary    Relevant Orders    HIV antibody (with reflex) (Completed)    Other fatigue        Need for hepatitis C screening test        Need for prophylactic vaccination against Streptococcus pneumoniae (pneumococcus)        Relevant Orders    Pneumococcal conjugate vaccine 13-valent (Completed)    Family history of early CAD        Relevant Orders    Ambulatory referral to Cardiology  I have discontinued Ms. Loiselle benzonatate. I am also having her maintain her Azelastine-Fluticasone, EPIPEN 2-PAK, Calcium Carbonate (CALTRATE 600 PO), multivitamin, esomeprazole, fluticasone-salmeterol, montelukast, ALBUTEROL SULFATE HFA IN, vitamin B-12, PRESCRIPTION MEDICATION, estradiol, progesterone, ergocalciferol, sulfamethoxazole-trimethoprim, predniSONE, telmisartan, multivitamin with minerals, and Biotin.  Meds ordered this encounter  Medications  . Multiple Vitamins-Minerals (MULTIVITAMIN WITH MINERALS) tablet    Sig: Take 1 tablet by mouth daily.  . Biotin 2500 MCG CAPS    Sig: Take 1 capsule by mouth daily.    Medications Discontinued During This Encounter  Medication Reason  . benzonatate (TESSALON) 200 MG capsule     Follow-up: No Follow-up on file.   Crecencio Mc, MD

## 2015-06-20 LAB — CA 125: CA 125: 15 U/mL (ref <35)

## 2015-06-20 LAB — HIV ANTIBODY (ROUTINE TESTING W REFLEX): HIV 1&2 Ab, 4th Generation: NONREACTIVE

## 2015-06-20 LAB — HEPATITIS C ANTIBODY: HCV Ab: NEGATIVE

## 2015-06-21 ENCOUNTER — Encounter: Payer: Self-pay | Admitting: Internal Medicine

## 2015-06-21 ENCOUNTER — Other Ambulatory Visit: Payer: Self-pay | Admitting: Internal Medicine

## 2015-06-21 DIAGNOSIS — I10 Essential (primary) hypertension: Secondary | ICD-10-CM | POA: Insufficient documentation

## 2015-06-21 DIAGNOSIS — R140 Abdominal distension (gaseous): Secondary | ICD-10-CM | POA: Insufficient documentation

## 2015-06-21 DIAGNOSIS — E559 Vitamin D deficiency, unspecified: Secondary | ICD-10-CM | POA: Insufficient documentation

## 2015-06-21 MED ORDER — ERGOCALCIFEROL 1.25 MG (50000 UT) PO CAPS: 50000.0000 [IU] | ORAL_CAPSULE | ORAL | Status: DC

## 2015-06-21 NOTE — Assessment & Plan Note (Signed)
She has no history of constipation, but has pelvic fullness and is post menopausal.  CA 125 and pelvic ultrasound to be done,

## 2015-06-21 NOTE — Assessment & Plan Note (Signed)
Well controlled on current regimen. Renal function stable, no changes today.  Lab Results  Component Value Date   CREATININE 0.63 06/19/2015   Lab Results  Component Value Date   NA 142 06/19/2015   K 4.0 06/19/2015   CL 105 06/19/2015   CO2 28 06/19/2015

## 2015-06-21 NOTE — Assessment & Plan Note (Signed)
Annual comprehensive preventive exam was done as well as an evaluation and management of chronic conditions .  During the course of the visit the patient was educated and counseled about appropriate screening and preventive services including :  diabetes screening, lipid analysis with projected  10 year  risk for CAD , nutrition counseling, breast, cervical and colorectal cancer screening, and recommended immunizations.  Printed recommendations for health maintenance screenings was given.  Lab Results  Component Value Date   CHOL 175 06/19/2015   HDL 48.70 06/19/2015   LDLCALC 105* 06/19/2015   LDLDIRECT 137.0 07/04/2014   TRIG 109.0 06/19/2015   CHOLHDL 4 06/19/2015

## 2015-06-21 NOTE — Assessment & Plan Note (Signed)
Her b12 level has not been rechecked in two years, but she is taking an oral  supplement.

## 2015-06-21 NOTE — Assessment & Plan Note (Signed)
She has a strong FH of CAD and is requesting evaluation with a cardiac CT.  Referral to cardiology

## 2015-06-21 NOTE — Assessment & Plan Note (Signed)
Recurrent.  Will resume weekly megadose for one month RESUME  2000 IUs daily

## 2015-06-22 DIAGNOSIS — J301 Allergic rhinitis due to pollen: Secondary | ICD-10-CM | POA: Diagnosis not present

## 2015-06-29 DIAGNOSIS — J301 Allergic rhinitis due to pollen: Secondary | ICD-10-CM | POA: Diagnosis not present

## 2015-07-06 ENCOUNTER — Ambulatory Visit (INDEPENDENT_AMBULATORY_CARE_PROVIDER_SITE_OTHER): Payer: BLUE CROSS/BLUE SHIELD | Admitting: Cardiovascular Disease

## 2015-07-06 ENCOUNTER — Encounter: Payer: Self-pay | Admitting: Cardiovascular Disease

## 2015-07-06 VITALS — BP 125/84 | HR 65 | Ht 63.0 in | Wt 151.2 lb

## 2015-07-06 DIAGNOSIS — Z8249 Family history of ischemic heart disease and other diseases of the circulatory system: Secondary | ICD-10-CM | POA: Diagnosis not present

## 2015-07-06 DIAGNOSIS — R0602 Shortness of breath: Secondary | ICD-10-CM | POA: Diagnosis not present

## 2015-07-06 DIAGNOSIS — I10 Essential (primary) hypertension: Secondary | ICD-10-CM

## 2015-07-06 DIAGNOSIS — J4520 Mild intermittent asthma, uncomplicated: Secondary | ICD-10-CM | POA: Diagnosis not present

## 2015-07-06 DIAGNOSIS — J301 Allergic rhinitis due to pollen: Secondary | ICD-10-CM | POA: Diagnosis not present

## 2015-07-06 NOTE — Patient Instructions (Addendum)
You are doing well. No medication changes were made.  We will order a CT coronary calcium score for family hx Tuesday, May 23 @ 1:30, please arrive @ 1:15 There is a one-time fee of $150 due at the time of your procedure [address] [place], [place], [place]  Please call us if you have new issues that need to be addressed before your next appt.  Your physician wants you to follow-up in: 12 months.  You will receive a reminder letter in the mail two months in advance. If you don't receive a letter, please call our office to schedule the follow-up appointment.  Coronary Calcium Scan A coronary calcium scan is an imaging test used to look for deposits of calcium and other fatty materials (plaques) in the inner lining of the blood vessels of your heart (coronary arteries). These deposits of calcium and plaques can partly clog and narrow the coronary arteries without producing any symptoms or warning signs. This puts you at risk for a heart attack. This test can detect these deposits before symptoms develop.  LET [facility] CARE PROVIDER KNOW ABOUT:  Any allergies you have.  All medicines you are taking, including vitamins, herbs, eye drops, creams, and over-the-counter medicines.  Previous problems you or members of your family have had with the use of anesthetics.  Any blood disorders you have.  Previous surgeries you have had.  Medical conditions you have.  Possibility of pregnancy, if this applies. RISKS AND COMPLICATIONS Generally, this is a safe procedure. However, as with any procedure, complications can occur. This test involves the use of radiation. Radiation exposure can be dangerous to a pregnant woman and her unborn baby. If you are pregnant, you should not have this procedure done.  BEFORE THE PROCEDURE There is no special preparation for the procedure. PROCEDURE  You will need to undress and put on a hospital gown. You will need to remove any jewelry  around your neck or chest.  Sticky electrodes are placed on your chest and are connected to an electrocardiogram (EKG or electrocardiography) machine to recorda tracing of the electrical activity of your heart.  A CT scanner will take pictures of your heart. During this time, you will be asked to lie still and hold your breath for 2-3 seconds while a picture is being taken of your heart. AFTER THE PROCEDURE   You will be allowed to get dressed.  You can return to your normal activities after the scan is done.   This information is not intended to replace advice given to you by your health care provider. Make sure you discuss any questions you have with your health care provider.   Document Released: 07/30/2007 Document Revised: 02/05/2013 Document Reviewed: 10/08/2012 Elsevier Interactive Patient Education [employer].

## 2015-07-06 NOTE — Assessment & Plan Note (Signed)
Blood pressure initially elevated, improved on my recheck No medication changes made

## 2015-07-06 NOTE — Assessment & Plan Note (Signed)
She reports asthma symptoms are well controlled on her current medication regiment

## 2015-07-06 NOTE — Assessment & Plan Note (Signed)
Atypical type chest pain, strong family history Long discussion concerning various treatment options for her Stress test likely of low utility She prefers CT coronary calcium score. This will be scheduled at her convenience

## 2015-07-06 NOTE — Progress Notes (Signed)
Patient ID: Jean Luna, female    DOB: 04-Apr-1955, 60 y.o.   MRN: [id number]  HPI Comments: Ms. Kapla is a pleasant 60 year old woman with a long history of severe asthma, follow-through [facility] , snoring, sinus issues followed by Dr. Tami Ribas, previously seen for severe shortness of breath, who presents for discussion of her breathing and family history of coronary artery disease  In follow-up today, she reports that she is on a good regimen for her breathing Uses inhalers, various medications for allergies Recently has not been a major issue  She is concerned about some chest tightness that she develops at times, sometimes at rest, sometimes with exertion, not severe. She is concerned about family history of coronary artery disease as numerous family members have had heart attack or bypass surgery  She is also worried about excessive fatigue Reports that she has done a sleeping study Otherwise is walking 1-2 miles per day but has to take a nap in the daytime  EKG on today's visit shows normal sinus rhythm with rate 69 bpm, no significant ST or T-wave changes  Other past medical history long extensive history of allergy-induced asthma. Previously treated with prednisone  Father was a previous smoker, had COPD also with cardiac issues Mother had no coronary disease    Allergies  Allergen Reactions  . Shellfish Allergy Swelling    Current Outpatient Prescriptions on File Prior to Visit  Medication Sig Dispense Refill  . ALBUTEROL SULFATE HFA IN Inhale 2 puffs into the lungs every 6 (six) hours as needed.    . Azelastine-Fluticasone (DYMISTA) 137-50 MCG/ACT SUSP Place 1 spray into both nostrils daily.    . Biotin 2500 MCG CAPS Take 1 capsule by mouth daily.    . Calcium Carbonate (CALTRATE 600 PO) Take 1 tablet by mouth daily.    Marland Kitchen EPIPEN 2-PAK 0.3 MG/0.3ML SOAJ     . ergocalciferol (DRISDOL) 50000 units capsule Take 1 capsule (50,000 Units total) by mouth  once a week. 4 capsule 0  . estradiol (VIVELLE-DOT) 0.025 MG/24HR Place once half patch to skin twice a week. 12 patch 3  . fluticasone-salmeterol (ADVAIR HFA) 115-21 MCG/ACT inhaler Inhale 2 puffs into the lungs 2 (two) times daily.    . montelukast (SINGULAIR) 10 MG tablet Take 1 tablet by mouth at bedtime.    . Multiple Vitamin (MULTIVITAMIN) capsule Take 1 capsule by mouth daily.    . Multiple Vitamins-Minerals (MULTIVITAMIN WITH MINERALS) tablet Take 1 tablet by mouth daily.    Marland Kitchen PRESCRIPTION MEDICATION Allergy Shots weekly    . progesterone (PROMETRIUM) 100 MG capsule Take 1 capsule (100 mg total) by mouth daily. 90 capsule 3  . sulfamethoxazole-trimethoprim (BACTRIM DS,SEPTRA DS) 800-160 MG per tablet Take 1 tablet by mouth 2 (two) times daily. 10 tablet 0  . telmisartan (MICARDIS) 40 MG tablet TAKE ONE TABLET BY MOUTH EVERY DAY ( NEED APPT FOR FURTHER REFILLS) 90 tablet 0  . vitamin B-12 (CYANOCOBALAMIN) 100 MCG tablet Take 100 mcg by mouth daily.     No current facility-administered medications on file prior to visit.    Past Medical History  Diagnosis Date  . Hypertension   . Asthma   . Migraine, menstrual     resolved with menopause  . Raynaud phenomenon   . Facial paralysis/Bells palsy     6 week paralysis of right side of face   . Fibroid   . Cervical disc herniation   . History of colon polyps  adenomatous  . History of abnormal cervical Pap smear     Past Surgical History  Procedure Laterality Date  . Nasal sinus surgery    . Cervical cone biopsy  1985  . Cervical biopsy  w/ loop electrode excision  1991  . Cervical disc surgery  11/2012    --removed bone spurs, bulging disc  . Colposcopy  1984    no treatment  . Finger surgery Left 06/2014    Left index finger.     Social History  reports that she has never smoked. She has never used smokeless tobacco. She reports that she does not drink alcohol or use illicit drugs.  Family History family history  includes Breast cancer (age of onset: 78) in her maternal grandmother; COPD (age of onset: 34) in her father; Celiac disease in her daughter; Diabetes in her father and paternal grandfather; Heart attack in her paternal grandfather; Heart attack (age of onset: 89) in her father; Heart disease in her father, paternal grandfather, and paternal uncle; Heart failure in her father and paternal grandfather; Hypertension in her mother; Stroke (age of onset: 63) in her mother.   Review of Systems  Constitutional: Negative.   Respiratory: Negative.   Cardiovascular: Positive for chest pain.  Gastrointestinal: Negative.   Musculoskeletal: Negative.   Neurological: Negative.   Hematological: Negative.   Psychiatric/Behavioral: Negative.   All other systems reviewed and are negative.   BP 148/100 mmHg  Pulse 65  Ht 5\' 3"  (1.6 m)  Wt 151 lb 4 oz (68.607 kg)  BMI 26.80 kg/m2  LMP 02/15/2003  Physical Exam  Constitutional: She is oriented to person, place, and time. She appears well-developed and well-nourished.  HENT:  Head: Normocephalic.  Nose: Nose normal.  Mouth/Throat: Oropharynx is clear and moist.  Eyes: Conjunctivae are normal. Pupils are equal, round, and reactive to light.  Neck: Normal range of motion. Neck supple. No JVD present.  Cardiovascular: Normal rate, regular rhythm, S1 normal, S2 normal, normal heart sounds and intact distal pulses.  Exam reveals no gallop and no friction rub.   No murmur heard. Pulmonary/Chest: Effort normal and breath sounds normal. No respiratory distress. She has no wheezes. She has no rales. She exhibits no tenderness.  Abdominal: Soft. Bowel sounds are normal. She exhibits no distension. There is no tenderness.  Musculoskeletal: Normal range of motion. She exhibits no edema or tenderness.  Lymphadenopathy:    She has no cervical adenopathy.  Neurological: She is alert and oriented to person, place, and time. Coordination normal.  Skin: Skin is warm  and dry. No rash noted. No erythema.  Psychiatric: She has a normal mood and affect. Her behavior is normal. Judgment and thought content normal.    Assessment and Plan  Nursing note and vitals reviewed.

## 2015-07-07 ENCOUNTER — Ambulatory Visit (INDEPENDENT_AMBULATORY_CARE_PROVIDER_SITE_OTHER)
Admission: RE | Admit: 2015-07-07 | Discharge: 2015-07-07 | Disposition: A | Discharge status: Home / Self Care | Payer: Self-pay | Source: Ambulatory Visit | Attending: Cardiovascular Disease | Admitting: Cardiovascular Disease

## 2015-07-07 DIAGNOSIS — Z136 Encounter for screening for cardiovascular disorders: Secondary | ICD-10-CM | POA: Diagnosis not present

## 2015-07-07 DIAGNOSIS — R0602 Shortness of breath: Secondary | ICD-10-CM

## 2015-07-07 DIAGNOSIS — Z8249 Family history of ischemic heart disease and other diseases of the circulatory system: Secondary | ICD-10-CM

## 2015-07-07 DIAGNOSIS — I10 Essential (primary) hypertension: Secondary | ICD-10-CM

## 2015-07-08 ENCOUNTER — Other Ambulatory Visit: Payer: Self-pay | Admitting: Internal Medicine

## 2015-07-08 ENCOUNTER — Ambulatory Visit
Admission: RE | Admit: 2015-07-08 | Discharge: 2015-07-08 | Disposition: A | Discharge status: Home / Self Care | Payer: BLUE CROSS/BLUE SHIELD | Source: Ambulatory Visit | Attending: Internal Medicine | Admitting: Internal Medicine

## 2015-07-08 DIAGNOSIS — Z1231 Encounter for screening mammogram for malignant neoplasm of breast: Secondary | ICD-10-CM

## 2015-07-11 ENCOUNTER — Other Ambulatory Visit: Payer: Self-pay | Admitting: Internal Medicine

## 2015-07-14 ENCOUNTER — Other Ambulatory Visit: Payer: Self-pay | Admitting: Internal Medicine

## 2015-07-14 MED ORDER — FAMOTIDINE 40 MG PO TABS: 40.0000 mg | ORAL_TABLET | Freq: Two times a day (BID) | ORAL | Status: DC

## 2015-07-14 MED ORDER — PREDNISONE 10 MG PO TABS: ORAL_TABLET | ORAL | Status: DC

## 2015-07-15 ENCOUNTER — Ambulatory Visit (INDEPENDENT_AMBULATORY_CARE_PROVIDER_SITE_OTHER): Payer: BLUE CROSS/BLUE SHIELD | Admitting: Obstetrics and Gynecology

## 2015-07-15 ENCOUNTER — Encounter: Payer: Self-pay | Admitting: Obstetrics and Gynecology

## 2015-07-15 VITALS — BP 112/66 | HR 80 | Resp 16 | Ht 63.25 in | Wt 151.0 lb

## 2015-07-15 DIAGNOSIS — Z01419 Encounter for gynecological examination (general) (routine) without abnormal findings: Secondary | ICD-10-CM | POA: Diagnosis not present

## 2015-07-15 DIAGNOSIS — J301 Allergic rhinitis due to pollen: Secondary | ICD-10-CM | POA: Diagnosis not present

## 2015-07-15 DIAGNOSIS — Z780 Asymptomatic menopausal state: Secondary | ICD-10-CM

## 2015-07-15 MED ORDER — PROGESTERONE MICRONIZED 100 MG PO CAPS: 100.0000 mg | ORAL_CAPSULE | Freq: Every day | ORAL | Status: DC

## 2015-07-15 MED ORDER — ESTRADIOL 0.025 MG/24HR TD PTTW
MEDICATED_PATCH | TRANSDERMAL | Status: DC
Start: 2015-07-15 — End: 2016-06-20

## 2015-07-15 NOTE — Patient Instructions (Signed)
Health Maintenance, Female Adopting a healthy lifestyle and getting preventive care can go a long way to promote health and wellness. Talk with your health care provider about what schedule of regular examinations is right for you. This is a good chance for you to check in with your provider about disease prevention and staying healthy. In between checkups, there are plenty of things you can do on your own. Experts have done a lot of research about which lifestyle changes and preventive measures are most likely to keep you healthy. Ask your health care provider for more information. WEIGHT AND DIET  Eat a healthy diet  Be sure to include plenty of vegetables, fruits, low-fat dairy products, and lean protein.  Do not eat a lot of foods high in solid fats, added sugars, or salt.  Get regular exercise. This is one of the most important things you can do for your health.  Most adults should exercise for at least 150 minutes each week. The exercise should increase your heart rate and make you sweat (moderate-intensity exercise).  Most adults should also do strengthening exercises at least twice a week. This is in addition to the moderate-intensity exercise.  Maintain a healthy weight  Body mass index (BMI) is a measurement that can be used to identify possible weight problems. It estimates body fat based on height and weight. Your health care provider can help determine your BMI and help you achieve or maintain a healthy weight.  For females 20 years of age and older:   A BMI below 18.5 is considered underweight.  A BMI of 18.5 to 24.9 is normal.  A BMI of 25 to 29.9 is considered overweight.  A BMI of 30 and above is considered obese.  Watch levels of cholesterol and blood lipids  You should start having your blood tested for lipids and cholesterol at 61 years of age, then have this test every 5 years.  You may need to have your cholesterol levels checked more often if:  Your lipid  or cholesterol levels are high.  You are older than 61 years of age.  You are at high risk for heart disease.  CANCER SCREENING   Lung Cancer  Lung cancer screening is recommended for adults 55-80 years old who are at high risk for lung cancer because of a history of smoking.  A yearly low-dose CT scan of the lungs is recommended for people who:  Currently smoke.  Have quit within the past 15 years.  Have at least a 30-pack-year history of smoking. A pack year is smoking an average of one pack of cigarettes a day for 1 year.  Yearly screening should continue until it has been 15 years since you quit.  Yearly screening should stop if you develop a health problem that would prevent you from having lung cancer treatment.  Breast Cancer  Practice breast self-awareness. This means understanding how your breasts normally appear and feel.  It also means doing regular breast self-exams. Let your health care provider know about any changes, no matter how small.  If you are in your 20s or 30s, you should have a clinical breast exam (CBE) by a health care provider every 1-3 years as part of a regular health exam.  If you are 40 or older, have a CBE every year. Also consider having a breast X-ray (mammogram) every year.  If you have a family history of breast cancer, talk to your health care provider about genetic screening.  If you   are at high risk for breast cancer, talk to your health care provider about having an MRI and a mammogram every year.  Breast cancer gene (BRCA) assessment is recommended for women who have family members with BRCA-related cancers. BRCA-related cancers include:  Breast.  Ovarian.  Tubal.  Peritoneal cancers.  Results of the assessment will determine the need for genetic counseling and BRCA1 and BRCA2 testing. Cervical Cancer Your health care provider may recommend that you be screened regularly for cancer of the pelvic organs (ovaries, uterus, and  vagina). This screening involves a pelvic examination, including checking for microscopic changes to the surface of your cervix (Pap test). You may be encouraged to have this screening done every 3 years, beginning at age 53.  For women ages 62-65, health care providers may recommend pelvic exams and Pap testing every 3 years, or they may recommend the Pap and pelvic exam, combined with testing for human papilloma virus (HPV), every 5 years. Some types of HPV increase your risk of cervical cancer. Testing for HPV may also be done on women of any age with unclear Pap test results.  Other health care providers may not recommend any screening for nonpregnant women who are considered low risk for pelvic cancer and who do not have symptoms. Ask your health care provider if a screening pelvic exam is right for you.  If you have had past treatment for cervical cancer or a condition that could lead to cancer, you need Pap tests and screening for cancer for at least 20 years after your treatment. If Pap tests have been discontinued, your risk factors (such as having a new sexual partner) need to be reassessed to determine if screening should resume. Some women have medical problems that increase the chance of getting cervical cancer. In these cases, your health care provider may recommend more frequent screening and Pap tests. Colorectal Cancer  This type of cancer can be detected and often prevented.  Routine colorectal cancer screening usually begins at 61 years of age and continues through 61 years of age.  Your health care provider may recommend screening at an earlier age if you have risk factors for colon cancer.  Your health care provider may also recommend using home test kits to check for hidden blood in the stool.  A small camera at the end of a tube can be used to examine your colon directly (sigmoidoscopy or colonoscopy). This is done to check for the earliest forms of colorectal  cancer.  Routine screening usually begins at age 94.  Direct examination of the colon should be repeated every 5-10 years through 61 years of age. However, you may need to be screened more often if early forms of precancerous polyps or small growths are found. Skin Cancer  Check your skin from head to toe regularly.  Tell your health care provider about any new moles or changes in moles, especially if there is a change in a mole's shape or color.  Also tell your health care provider if you have a mole that is larger than the size of a pencil eraser.  Always use sunscreen. Apply sunscreen liberally and repeatedly throughout the day.  Protect yourself by wearing long sleeves, pants, a wide-brimmed hat, and sunglasses whenever you are outside. HEART DISEASE, DIABETES, AND HIGH BLOOD PRESSURE   High blood pressure causes heart disease and increases the risk of stroke. High blood pressure is more likely to develop in:  People who have blood pressure in the high end  of the normal range (130-139/85-89 mm Hg).  People who are overweight or obese.  People who are African American.  If you are 38-23 years of age, have your blood pressure checked every 3-5 years. If you are 61 years of age or older, have your blood pressure checked every year. You should have your blood pressure measured twice--once when you are at a hospital or clinic, and once when you are not at a hospital or clinic. Record the average of the two measurements. To check your blood pressure when you are not at a hospital or clinic, you can use:  An automated blood pressure machine at a pharmacy.  A home blood pressure monitor.  If you are between 45 years and 39 years old, ask your health care provider if you should take aspirin to prevent strokes.  Have regular diabetes screenings. This involves taking a blood sample to check your fasting blood sugar level.  If you are at a normal weight and have a low risk for diabetes,  have this test once every three years after 61 years of age.  If you are overweight and have a high risk for diabetes, consider being tested at a younger age or more often. PREVENTING INFECTION  Hepatitis B  If you have a higher risk for hepatitis B, you should be screened for this virus. You are considered at high risk for hepatitis B if:  You were born in a country where hepatitis B is common. Ask your health care provider which countries are considered high risk.  Your parents were born in a high-risk country, and you have not been immunized against hepatitis B (hepatitis B vaccine).  You have HIV or AIDS.  You use needles to inject street drugs.  You live with someone who has hepatitis B.  You have had sex with someone who has hepatitis B.  You get hemodialysis treatment.  You take certain medicines for conditions, including cancer, organ transplantation, and autoimmune conditions. Hepatitis C  Blood testing is recommended for:  Everyone born from [birth year] through 1966.  Anyone with known risk factors for hepatitis C. Sexually transmitted infections (STIs)  You should be screened for sexually transmitted infections (STIs) including gonorrhea and chlamydia if:  You are sexually active and are younger than 61 years of age.  You are older than 61 years of age and your health care provider tells you that you are at risk for this type of infection.  Your sexual activity has changed since you were last screened and you are at an increased risk for chlamydia or gonorrhea. Ask your health care provider if you are at risk.  If you do not have HIV, but are at risk, it may be recommended that you take a prescription medicine daily to prevent HIV infection. This is called pre-exposure prophylaxis (PrEP). You are considered at risk if:  You are sexually active and do not regularly use condoms or know the HIV status of your partner(s).  You take drugs by injection.  You are sexually  active with a partner who has HIV. Talk with your health care provider about whether you are at high risk of being infected with HIV. If you choose to begin PrEP, you should first be tested for HIV. You should then be tested every 3 months for as long as you are taking PrEP.  PREGNANCY   If you are premenopausal and you may become pregnant, ask your health care provider about preconception counseling.  If you may  become pregnant, take 400 to 800 micrograms (mcg) of folic acid every day.  If you want to prevent pregnancy, talk to your health care provider about birth control (contraception). OSTEOPOROSIS AND MENOPAUSE   Osteoporosis is a disease in which the bones lose minerals and strength with aging. This can result in serious bone fractures. Your risk for osteoporosis can be identified using a bone density scan.  If you are 61 years of age or older, or if you are at risk for osteoporosis and fractures, ask your health care provider if you should be screened.  Ask your health care provider whether you should take a calcium or vitamin D supplement to lower your risk for osteoporosis.  Menopause may have certain physical symptoms and risks.  Hormone replacement therapy may reduce some of these symptoms and risks. Talk to your health care provider about whether hormone replacement therapy is right for you.  HOME CARE INSTRUCTIONS   Schedule regular health, dental, and eye exams.  Stay current with your immunizations.   Do not use any tobacco products including cigarettes, chewing tobacco, or electronic cigarettes.  If you are pregnant, do not drink alcohol.  If you are breastfeeding, limit how much and how often you drink alcohol.  Limit alcohol intake to no more than 1 drink per day for nonpregnant women. One drink equals 12 ounces of beer, 5 ounces of wine, or 1 ounces of hard liquor.  Do not use street drugs.  Do not share needles.  Ask your health care provider for help if  you need support or information about quitting drugs.  Tell your health care provider if you often feel depressed.  Tell your health care provider if you have ever been abused or do not feel safe at home.   This information is not intended to replace advice given to you by your health care provider. Make sure you discuss any questions you have with your health care provider.   Document Released: 08/16/2010 Document Revised: 02/21/2014 Document Reviewed: 01/02/2013 Elsevier Interactive Patient Education [employer].

## 2015-07-15 NOTE — Progress Notes (Signed)
60 y.o. G32P1031 Married Caucasian female here for annual exam.    On HRT. Cutting patch in half. Wants to continue with HRT as she has hot flashes when she stops.  Will wean off in the colder weather.  Sees dermatology.   PCP: Deborra Medina  Patient's last menstrual period was 02/15/2003.           Sexually active: Yes.    The current method of family planning is post menopausal status.    Exercising: No.  try to walk Smoker:  no  Health Maintenance: Pap:  06/26/14 Neg. HR HPV:neg History of abnormal Pap:  Yes LEEP 1991 MMG:  07/08/15 BIRADS1:neg Colonoscopy:  01/23/13 - Polyps - repeat 3 years  BMD:   Never  TDaP:  06/2013 Gardasil:   N/A HIV: 06/19/2015 Neg Hep C: 06/19/15 Neg Screening Labs:  Hb today: PCP, Urine today: PCP   reports that she has never smoked. She has never used smokeless tobacco. She reports that she does not drink alcohol or use illicit drugs.  Past Medical History  Diagnosis Date  . Hypertension   . Asthma   . Migraine, menstrual     resolved with menopause  . Raynaud phenomenon   . Facial paralysis/Bells palsy     6 week paralysis of right side of face   . Fibroid   . Cervical disc herniation   . History of colon polyps     adenomatous  . History of abnormal cervical Pap smear     Past Surgical History  Procedure Laterality Date  . Nasal sinus surgery    . Cervical cone biopsy  1985  . Cervical biopsy  w/ loop electrode excision  1991  . Cervical disc surgery  11/2012    --removed bone spurs, bulging disc  . Colposcopy  1984    no treatment  . Finger surgery Left 06/2014    Left index finger.     Current Outpatient Prescriptions  Medication Sig Dispense Refill  . ALBUTEROL SULFATE HFA IN Inhale 2 puffs into the lungs every 6 (six) hours as needed.    . Ascorbic Acid (VITAMIN C) 100 MG tablet Take 100 mg by mouth daily.    . Azelastine-Fluticasone (DYMISTA) 137-50 MCG/ACT SUSP Place 1 spray into both nostrils daily.    . Biotin 2500 MCG  CAPS Take 1 capsule by mouth daily.    . Calcium Carbonate (CALTRATE 600 PO) Take 1 tablet by mouth daily.    . ergocalciferol (DRISDOL) 50000 units capsule Take 1 capsule (50,000 Units total) by mouth once a week. 4 capsule 0  . estradiol (VIVELLE-DOT) 0.025 MG/24HR Place once half patch to skin twice a week. 12 patch 3  . famotidine (PEPCID) 40 MG tablet Take 1 tablet (40 mg total) by mouth 2 (two) times daily. For 5 days 10 tablet 1  . fluticasone-salmeterol (ADVAIR HFA) 115-21 MCG/ACT inhaler Inhale 2 puffs into the lungs 2 (two) times daily.    . montelukast (SINGULAIR) 10 MG tablet   10  . Multiple Vitamin (MULTIVITAMIN) capsule Take 1 capsule by mouth daily.    . predniSONE (DELTASONE) 10 MG tablet 6 tablets on Day 1 , then reduce by 1 tablet daily until gone 21 tablet 0  . PRESCRIPTION MEDICATION Allergy Shots weekly    . progesterone (PROMETRIUM) 100 MG capsule Take 1 capsule (100 mg total) by mouth daily. 90 capsule 3  . telmisartan (MICARDIS) 40 MG tablet TAKE 1 TABLET BY MOUTH DAILY 90 tablet 3  .  vitamin B-12 (CYANOCOBALAMIN) 100 MCG tablet Take 100 mcg by mouth daily.    Marland Kitchen EPIPEN 2-PAK 0.3 MG/0.3ML SOAJ Reported on 07/15/2015     No current facility-administered medications for this visit.    Family History  Problem Relation Age of Onset  . Celiac disease Daughter   . Hypertension Mother   . Stroke Mother 83    cerebral aneurysm  . Heart disease Father     CABG  . COPD Father 58  . Diabetes Father   . Heart failure Father   . Heart attack Father 53  . Breast cancer Maternal Grandmother 61    dec 89   . Diabetes Paternal Grandfather   . Heart attack Paternal Grandfather   . Heart disease Paternal Grandfather   . Heart failure Paternal Grandfather   . Heart disease Paternal Uncle     ROS:  Pertinent items are noted in HPI.  Otherwise, a comprehensive ROS was negative.  Exam:   BP 112/66 mmHg  Pulse 80  Resp 16  Ht 5' 3.25" (1.607 m)  Wt 151 lb (68.493 kg)   BMI 26.52 kg/m2  LMP 02/15/2003    General appearance: alert, cooperative and appears stated age Head: Normocephalic, without obvious abnormality, atraumatic Neck: no adenopathy, supple, symmetrical, trachea midline and thyroid normal to inspection and palpation Lungs: clear to auscultation bilaterally Breasts: normal appearance, no masses or tenderness, Inspection negative, No nipple retraction or dimpling, No nipple discharge or bleeding, No axillary or supraclavicular adenopathy Heart: regular rate and rhythm Abdomen: incisions:  No.    , soft, non-tender; no masses, no organomegaly Extremities: extremities normal, atraumatic, no cyanosis or edema Skin: Skin color, texture, turgor normal. No rashes or lesions Lymph nodes: Cervical, supraclavicular, and axillary nodes normal. No abnormal inguinal nodes palpated Neurologic: Grossly normal  Pelvic: External genitalia:  no lesions              Urethra:  normal appearing urethra with no masses, tenderness or lesions              Bartholins and Skenes: normal                 Vagina: normal appearing vagina with normal color and discharge, no lesions              Cervix: no lesions              Pap taken: No. Bimanual Exam:  Uterus:  normal size, contour, position, consistency, mobility, non-tender              Adnexa: normal adnexa and no mass, fullness, tenderness              Rectal exam: Yes.  .  Confirms.              Anus:  normal sphincter tone, no lesions  Chaperone was present for exam.  Assessment:   Well woman visit with normal exam. Remote hx of cervical dysplasia.  HRT patient.  Hx of colonic polyps.  Plan: Yearly mammogram recommended after age 88.  Recommended self breast exam.  Pap and HR HPV as above. Discussed Calcium, Vitamin D, regular exercise program including cardiovascular and weight bearing exercise. Labs performed.  No..   See orders. Prescription medication(s) given.  Yes.  .  See orders.  Prometrium and  Vivelle Dot.  I discussed risks of DVT, PE, MI, stroke, and breast cancer. Patient will wean off in the fall this year. Colonoscopy this year.  Follow up annually and prn.      After visit summary provided.

## 2015-07-20 DIAGNOSIS — J301 Allergic rhinitis due to pollen: Secondary | ICD-10-CM | POA: Diagnosis not present

## 2015-07-29 NOTE — Telephone Encounter (Signed)
Mailed unread message to patient, thanks 

## 2015-07-30 DIAGNOSIS — J301 Allergic rhinitis due to pollen: Secondary | ICD-10-CM | POA: Diagnosis not present

## 2015-08-06 DIAGNOSIS — J301 Allergic rhinitis due to pollen: Secondary | ICD-10-CM | POA: Diagnosis not present

## 2015-08-17 DIAGNOSIS — J301 Allergic rhinitis due to pollen: Secondary | ICD-10-CM | POA: Diagnosis not present

## 2015-08-21 DIAGNOSIS — J301 Allergic rhinitis due to pollen: Secondary | ICD-10-CM | POA: Diagnosis not present

## 2015-08-31 DIAGNOSIS — J301 Allergic rhinitis due to pollen: Secondary | ICD-10-CM | POA: Diagnosis not present

## 2015-09-07 DIAGNOSIS — J301 Allergic rhinitis due to pollen: Secondary | ICD-10-CM | POA: Diagnosis not present

## 2015-09-07 NOTE — Telephone Encounter (Signed)
My Chart message sent

## 2015-09-10 DIAGNOSIS — J301 Allergic rhinitis due to pollen: Secondary | ICD-10-CM | POA: Diagnosis not present

## 2015-09-10 DIAGNOSIS — L570 Actinic keratosis: Secondary | ICD-10-CM | POA: Diagnosis not present

## 2015-09-10 DIAGNOSIS — Z85828 Personal history of other malignant neoplasm of skin: Secondary | ICD-10-CM | POA: Diagnosis not present

## 2015-09-17 DIAGNOSIS — J301 Allergic rhinitis due to pollen: Secondary | ICD-10-CM | POA: Diagnosis not present

## 2015-09-24 DIAGNOSIS — J301 Allergic rhinitis due to pollen: Secondary | ICD-10-CM | POA: Diagnosis not present

## 2015-09-25 DIAGNOSIS — J4540 Moderate persistent asthma, uncomplicated: Secondary | ICD-10-CM | POA: Diagnosis not present

## 2015-10-01 DIAGNOSIS — J301 Allergic rhinitis due to pollen: Secondary | ICD-10-CM | POA: Diagnosis not present

## 2015-10-08 DIAGNOSIS — J301 Allergic rhinitis due to pollen: Secondary | ICD-10-CM | POA: Diagnosis not present

## 2015-10-09 DIAGNOSIS — J4540 Moderate persistent asthma, uncomplicated: Secondary | ICD-10-CM | POA: Diagnosis not present

## 2015-10-22 DIAGNOSIS — J301 Allergic rhinitis due to pollen: Secondary | ICD-10-CM | POA: Diagnosis not present

## 2015-10-27 ENCOUNTER — Ambulatory Visit (INDEPENDENT_AMBULATORY_CARE_PROVIDER_SITE_OTHER): Payer: BLUE CROSS/BLUE SHIELD | Admitting: Family

## 2015-10-27 ENCOUNTER — Encounter: Payer: Self-pay | Admitting: Family

## 2015-10-27 VITALS — BP 142/88 | HR 72 | Temp 97.8°F | Ht 63.0 in | Wt 148.6 lb

## 2015-10-27 DIAGNOSIS — L237 Allergic contact dermatitis due to plants, except food: Secondary | ICD-10-CM

## 2015-10-27 MED ORDER — PREDNISONE 10 MG PO TABS: ORAL_TABLET | ORAL | 0 refills | Status: DC

## 2015-10-27 NOTE — Progress Notes (Signed)
Pre visit review using our clinic review tool, if applicable. No additional management support is needed unless otherwise documented below in the visit note. 

## 2015-10-27 NOTE — Patient Instructions (Signed)
If there is no improvement in your symptoms, or if there is any worsening of symptoms, or if you have any additional concerns, please return for re-evaluation; or, if we are closed, consider going to the Emergency Room for evaluation if symptoms urgent.    Poison [facility] is an inflammation of the skin (contact dermatitis). It is caused by contact with the allergens on the leaves of the oak (toxicodendron) plants. Depending on your sensitivity, the rash may consist simply of redness and itching, or it may also progress to blisters which may break open (rupture). These must be well cared for to prevent secondary germ (bacterial) infection as these infections can lead to scarring. The eyes may also get puffy. The puffiness is worst in the morning and gets better as the day progresses. Healing is best accomplished by keeping any open areas dry, clean, covered with a bandage, and covered with an antibacterial ointment if needed. Without secondary infection, this dermatitis usually heals without scarring within 2 to 3 weeks without treatment. HOME CARE INSTRUCTIONS When you have been exposed to poison oak, it is very important to thoroughly wash with soap and water as soon as the exposure has been discovered. You have about one half hour to remove the plant resin before it will cause the rash. This cleaning will quickly destroy the oil or antigen on the skin (the antigen is what causes the rash). Wash aggressively under the fingernails as any plant resin still there will continue to spread the rash. Do not rub skin vigorously when washing affected area. Poison oak cannot spread if no oil from the plant remains on your body. Rash that has progressed to weeping sores (lesions) will not spread the rash unless you have not washed thoroughly. It is also important to clean any clothes you have been wearing as they may carry active allergens which will spread the rash, even several days later. Avoidance of the  plant in the future is the best measure. Poison oak plants can be recognized by the number of leaves. Generally, poison oak has three leaves with flowering branches on a single stem. Diphenhydramine may be purchased over the counter and used as needed for itching. Do not drive with this medication if it makes you drowsy. Ask your caregiver about medication for children. SEEK IMMEDIATE MEDICAL CARE IF:   Open areas of the rash develop.  You notice redness extending beyond the area of the rash.  There is a pus like discharge.  There is increased pain.  Other signs of infection develop (such as fever).   This information is not intended to replace advice given to you by your health care provider. Make sure you discuss any questions you have with your health care provider.   Document Released: 08/07/2002 Document Revised: 04/25/2011 Document Reviewed: 07/09/2014 Elsevier Interactive Patient Education [employer].

## 2015-10-27 NOTE — Progress Notes (Signed)
Subjective:    Patient ID: MERIDEE BINNER, female    DOB: December 26, 1955, 60 y.o.   MRN: [id number]  CC: NICIA LAVIGNA is a 60 y.o. female who presents today for an acute visit.    HPI: Patient here with chief complaint of pruritic rash on bilateral wrists and hands, suspects "poison oak'. Onset one week ago after working in friend's yard. Starting on left wrist. Applied Cortizone cream with some relief. Describes as raised, inflamed rash with modest improvement.  No fever, chills. History of asthma. No h/o DM.     HISTORY:  Past Medical History:  Diagnosis Date  . Asthma   . Cervical disc herniation   . Facial paralysis/Bells palsy    6 week paralysis of right side of face   . Fibroid   . History of abnormal cervical Pap smear   . History of colon polyps    adenomatous  . Hypertension   . Migraine, menstrual    resolved with menopause  . Raynaud phenomenon    Past Surgical History:  Procedure Laterality Date  . CERVICAL BIOPSY  W/ LOOP ELECTRODE EXCISION  1991  . CERVICAL CONE BIOPSY  1985  . CERVICAL DISC SURGERY  11/2012   --removed bone spurs, bulging disc  . COLPOSCOPY  1984   no treatment  . FINGER SURGERY Left 06/2014   Left index finger.   Marland Kitchen NASAL SINUS SURGERY     Family History  Problem Relation Age of Onset  . Celiac disease Daughter   . Hypertension Mother   . Stroke Mother 71    cerebral aneurysm  . Heart disease Father     CABG  . COPD Father 79  . Diabetes Father   . Heart failure Father   . Heart attack Father 2  . Breast cancer Maternal Grandmother 61    dec 89   . Diabetes Paternal Grandfather   . Heart attack Paternal Grandfather   . Heart disease Paternal Grandfather   . Heart failure Paternal Grandfather   . Heart disease Paternal Uncle     Allergies: Shellfish allergy Current Outpatient Prescriptions on File Prior to Visit  Medication Sig Dispense Refill  . ALBUTEROL SULFATE HFA IN Inhale 2 puffs into the lungs every 6 (six)  hours as needed.    . Ascorbic Acid (VITAMIN C) 100 MG tablet Take 100 mg by mouth daily.    . Azelastine-Fluticasone (DYMISTA) 137-50 MCG/ACT SUSP Place 1 spray into both nostrils daily.    . Biotin 2500 MCG CAPS Take 1 capsule by mouth daily.    . Calcium Carbonate (CALTRATE 600 PO) Take 1 tablet by mouth daily.    Marland Kitchen EPIPEN 2-PAK 0.3 MG/0.3ML SOAJ Reported on 07/15/2015    . ergocalciferol (DRISDOL) 50000 units capsule Take 1 capsule (50,000 Units total) by mouth once a week. 4 capsule 0  . estradiol (VIVELLE-DOT) 0.025 MG/24HR Place once half patch to skin twice a week. 12 patch 3  . famotidine (PEPCID) 40 MG tablet Take 1 tablet (40 mg total) by mouth 2 (two) times daily. For 5 days 10 tablet 1  . fluticasone-salmeterol (ADVAIR HFA) 115-21 MCG/ACT inhaler Inhale 2 puffs into the lungs 2 (two) times daily.    . montelukast (SINGULAIR) 10 MG tablet   10  . Multiple Vitamin (MULTIVITAMIN) capsule Take 1 capsule by mouth daily.    Marland Kitchen PRESCRIPTION MEDICATION Allergy Shots weekly    . progesterone (PROMETRIUM) 100 MG capsule Take 1 capsule (100 mg total)  by mouth daily. 90 capsule 3  . telmisartan (MICARDIS) 40 MG tablet TAKE 1 TABLET BY MOUTH DAILY 90 tablet 3  . vitamin B-12 (CYANOCOBALAMIN) 100 MCG tablet Take 100 mcg by mouth daily.     No current facility-administered medications on file prior to visit.     Social History  Substance Use Topics  . Smoking status: Never Smoker  . Smokeless tobacco: Never Used  . Alcohol use No    Review of Systems  Constitutional: Negative for chills and fever.  Respiratory: Negative for cough, shortness of breath and wheezing.   Cardiovascular: Negative for chest pain and palpitations.  Gastrointestinal: Negative for nausea and vomiting.  Skin: Positive for rash.      Objective:    BP (!) 142/88   Pulse 72   Temp 97.8 F (36.6 C) (Oral)   Ht 5\' 3"  (1.6 m)   Wt 148 lb 9.6 oz (67.4 kg)   LMP 02/15/2003   SpO2 98%   BMI 26.32 kg/m     Physical Exam  Constitutional: She appears well-developed and well-nourished.  Eyes: Conjunctivae are normal.  Cardiovascular: Normal rate, regular rhythm, normal heart sounds and normal pulses.   Pulmonary/Chest: Effort normal and breath sounds normal. She has no wheezes. She has no rhonchi. She has no rales.  Neurological: She is alert.  Skin: Skin is warm and dry. Rash noted. Rash is papular.     Linear erythematous papules medial aspect of bilateral forearms. Crusting. No discharge. Surrounding skin of normal color, intact. No streaking.  Psychiatric: She has a normal mood and affect. Her speech is normal and behavior is normal. Thought content normal.  Vitals reviewed.      Assessment & Plan:  1. Poison oak dermatitis Rash does not appear infected. Symptomatic  treatment including anti-itch cream and PO Benadryl as needed. Patient and I agreed on an 8 day day course of prednisone taper. Return precautions given.  - predniSONE (DELTASONE) 10 MG tablet; Take 4 tablets ( total 40 mg) by mouth for 2 days; take 3 tablets ( total 30 mg) by mouth for 2 days; take 2 tablets (total 20mg ) by mouth for 2 days; then take 1 tablet ( total 10mg ) by mouth for 2 days.  Dispense: 20 tablet; Refill: 0     I have discontinued Ms. Haapala predniSONE. I am also having her start on predniSONE. Additionally, I am having her maintain her Azelastine-Fluticasone, EPIPEN 2-PAK, Calcium Carbonate (CALTRATE 600 PO), multivitamin, fluticasone-salmeterol, ALBUTEROL SULFATE HFA IN, vitamin B-12, PRESCRIPTION MEDICATION, Biotin, ergocalciferol, telmisartan, famotidine, montelukast, vitamin C, progesterone, and estradiol.   Meds ordered this encounter  Medications  . predniSONE (DELTASONE) 10 MG tablet    Sig: Take 4 tablets ( total 40 mg) by mouth for 2 days; take 3 tablets ( total 30 mg) by mouth for 2 days; take 2 tablets (total 20mg ) by mouth for 2 days; then take 1 tablet ( total 10mg ) by mouth for  2 days.    Dispense:  20 tablet    Refill:  0    Order Specific Question:   Supervising Provider    Answer:   Crecencio Mc [[id number]]    Return precautions given.   Risks, benefits, and alternatives of the medications and treatment plan prescribed today were discussed, and patient expressed understanding.   Education regarding symptom management and diagnosis given to patient on AVS.  Continue to follow with TULLO, Aris Everts, MD for routine health maintenance.   Hassie Bruce  and I agreed with plan.   Mable Paris, FNP

## 2015-10-29 DIAGNOSIS — J301 Allergic rhinitis due to pollen: Secondary | ICD-10-CM | POA: Diagnosis not present

## 2015-11-12 DIAGNOSIS — J301 Allergic rhinitis due to pollen: Secondary | ICD-10-CM | POA: Diagnosis not present

## 2015-11-13 DIAGNOSIS — J301 Allergic rhinitis due to pollen: Secondary | ICD-10-CM | POA: Diagnosis not present

## 2015-11-19 DIAGNOSIS — J301 Allergic rhinitis due to pollen: Secondary | ICD-10-CM | POA: Diagnosis not present

## 2015-11-26 DIAGNOSIS — J301 Allergic rhinitis due to pollen: Secondary | ICD-10-CM | POA: Diagnosis not present

## 2015-12-03 DIAGNOSIS — J301 Allergic rhinitis due to pollen: Secondary | ICD-10-CM | POA: Diagnosis not present

## 2015-12-10 DIAGNOSIS — J301 Allergic rhinitis due to pollen: Secondary | ICD-10-CM | POA: Diagnosis not present

## 2015-12-17 DIAGNOSIS — J301 Allergic rhinitis due to pollen: Secondary | ICD-10-CM | POA: Diagnosis not present

## 2015-12-24 DIAGNOSIS — J301 Allergic rhinitis due to pollen: Secondary | ICD-10-CM | POA: Diagnosis not present

## 2015-12-30 DIAGNOSIS — J301 Allergic rhinitis due to pollen: Secondary | ICD-10-CM | POA: Diagnosis not present

## 2016-01-06 DIAGNOSIS — J301 Allergic rhinitis due to pollen: Secondary | ICD-10-CM | POA: Diagnosis not present

## 2016-01-14 DIAGNOSIS — J301 Allergic rhinitis due to pollen: Secondary | ICD-10-CM | POA: Diagnosis not present

## 2016-01-18 DIAGNOSIS — J019 Acute sinusitis, unspecified: Secondary | ICD-10-CM | POA: Diagnosis not present

## 2016-01-18 DIAGNOSIS — R05 Cough: Secondary | ICD-10-CM | POA: Diagnosis not present

## 2016-01-25 DIAGNOSIS — J301 Allergic rhinitis due to pollen: Secondary | ICD-10-CM | POA: Diagnosis not present

## 2016-02-01 DIAGNOSIS — J301 Allergic rhinitis due to pollen: Secondary | ICD-10-CM | POA: Diagnosis not present

## 2016-02-05 DIAGNOSIS — J301 Allergic rhinitis due to pollen: Secondary | ICD-10-CM | POA: Diagnosis not present

## 2016-02-19 DIAGNOSIS — Z86010 Personal history of colonic polyps: Secondary | ICD-10-CM | POA: Diagnosis not present

## 2016-02-22 DIAGNOSIS — J301 Allergic rhinitis due to pollen: Secondary | ICD-10-CM | POA: Diagnosis not present

## 2016-02-29 DIAGNOSIS — J301 Allergic rhinitis due to pollen: Secondary | ICD-10-CM | POA: Diagnosis not present

## 2016-03-10 DIAGNOSIS — J301 Allergic rhinitis due to pollen: Secondary | ICD-10-CM | POA: Diagnosis not present

## 2016-03-17 DIAGNOSIS — J301 Allergic rhinitis due to pollen: Secondary | ICD-10-CM | POA: Diagnosis not present

## 2016-03-22 DIAGNOSIS — D126 Benign neoplasm of colon, unspecified: Secondary | ICD-10-CM | POA: Diagnosis not present

## 2016-03-22 DIAGNOSIS — D122 Benign neoplasm of ascending colon: Secondary | ICD-10-CM | POA: Diagnosis not present

## 2016-03-22 DIAGNOSIS — K621 Rectal polyp: Secondary | ICD-10-CM | POA: Diagnosis not present

## 2016-03-22 DIAGNOSIS — Z86010 Personal history of colonic polyps: Secondary | ICD-10-CM | POA: Diagnosis not present

## 2016-03-22 DIAGNOSIS — K635 Polyp of colon: Secondary | ICD-10-CM | POA: Diagnosis not present

## 2016-03-22 DIAGNOSIS — K620 Anal polyp: Secondary | ICD-10-CM | POA: Diagnosis not present

## 2016-03-24 DIAGNOSIS — J301 Allergic rhinitis due to pollen: Secondary | ICD-10-CM | POA: Diagnosis not present

## 2016-03-27 IMAGING — MG MM SCREENING BREAST TOMO BILATERAL
9 of 12 series · 9 of 28 positions shown · non-contrast
Comparison: Previous exam(s).

CLINICAL DATA: Screening.

EXAM:
DIGITAL SCREENING BILATERAL MAMMOGRAM WITH 3D TOMO WITH CAD

[R MLO synth-2D]
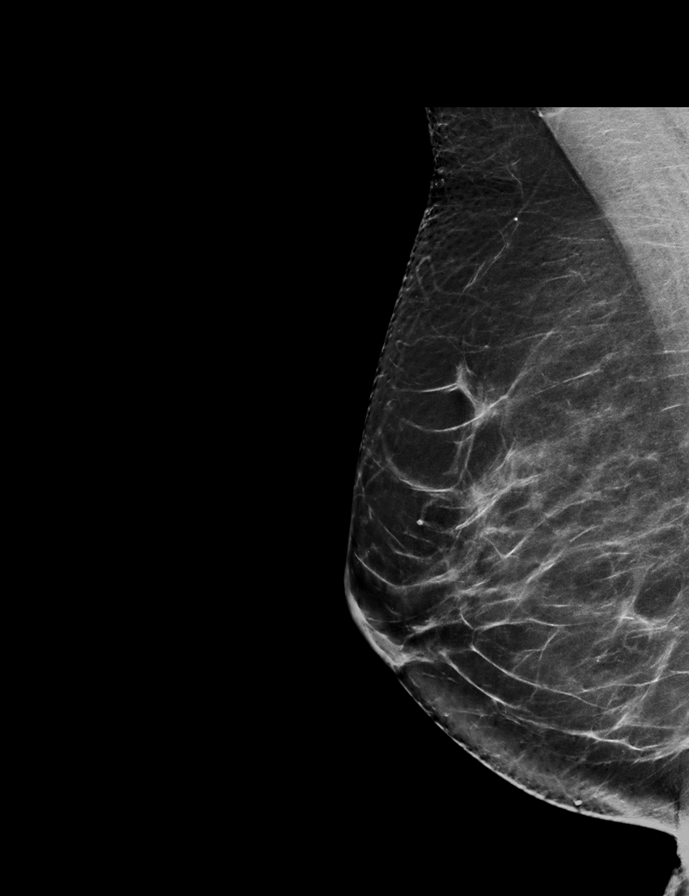

[L MLO synth-2D]
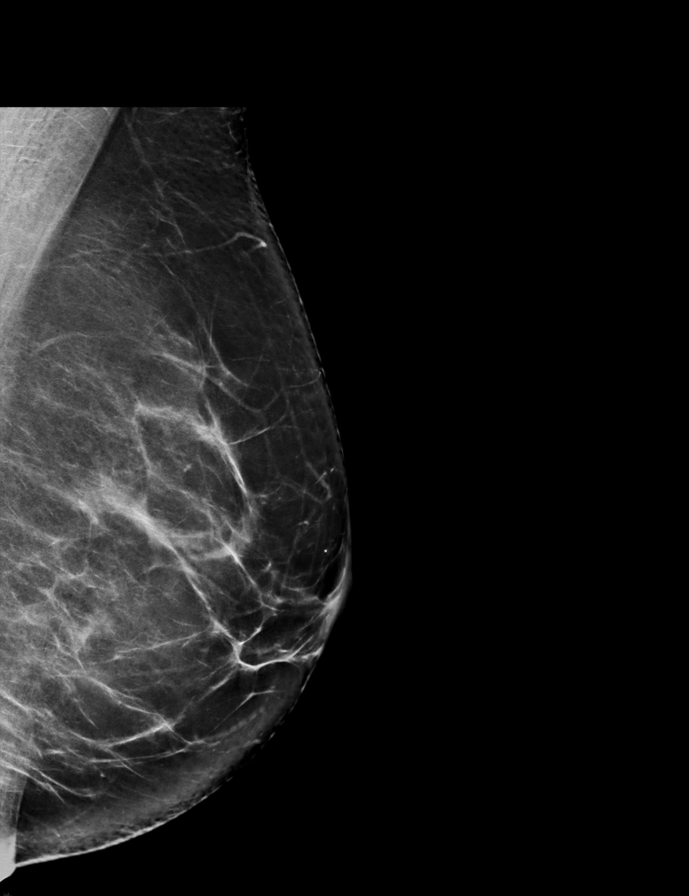

[R MLO]
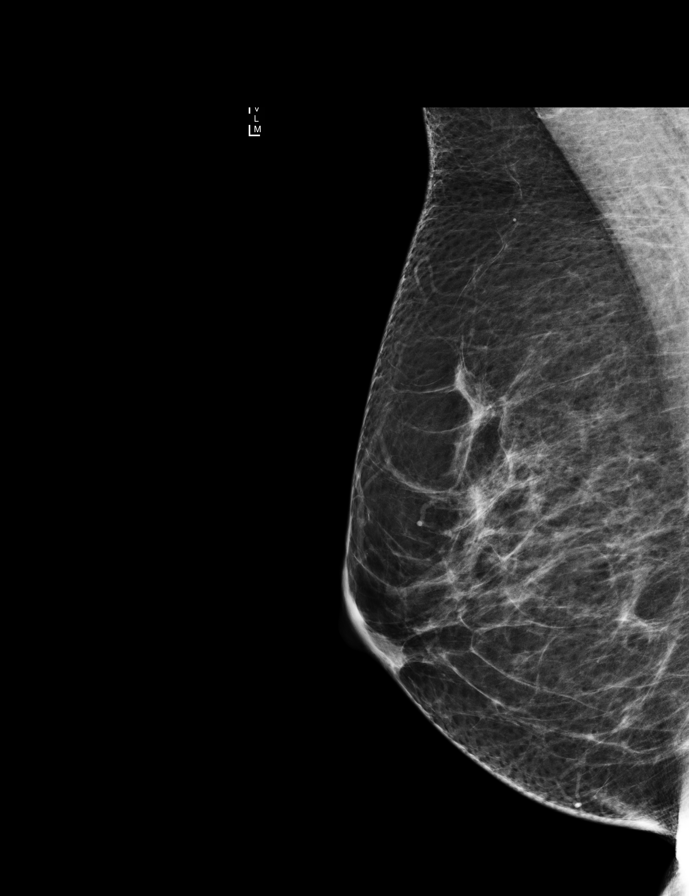

[L CC synth-2D]
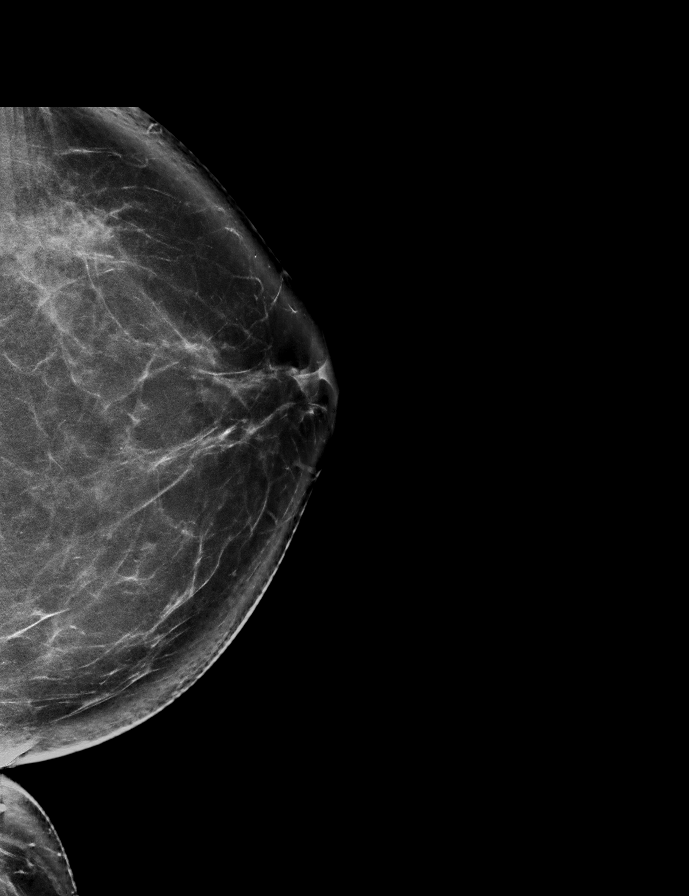

[L MLO]
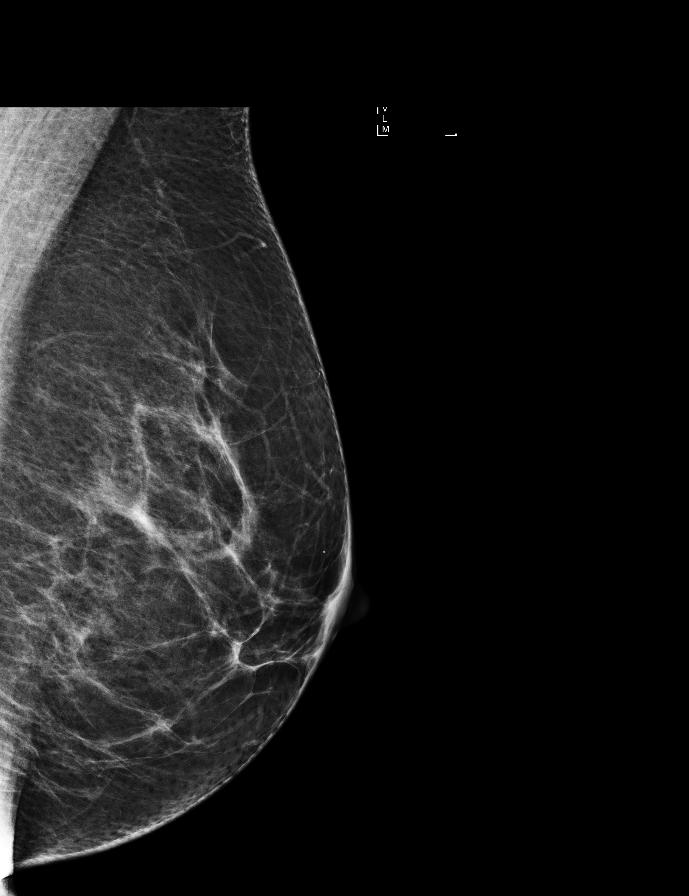

[R CC]
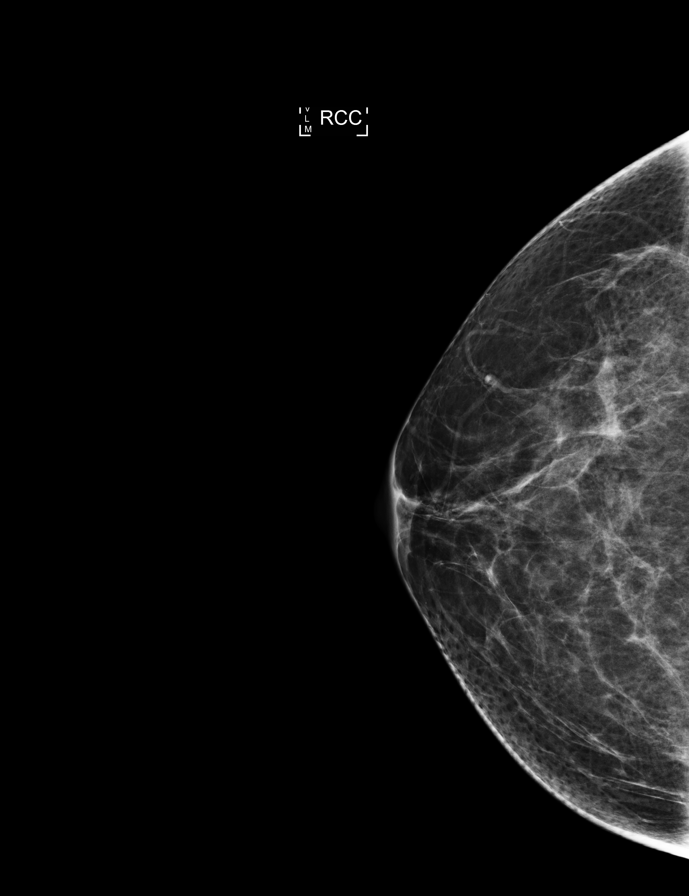

[R CC synth-2D]
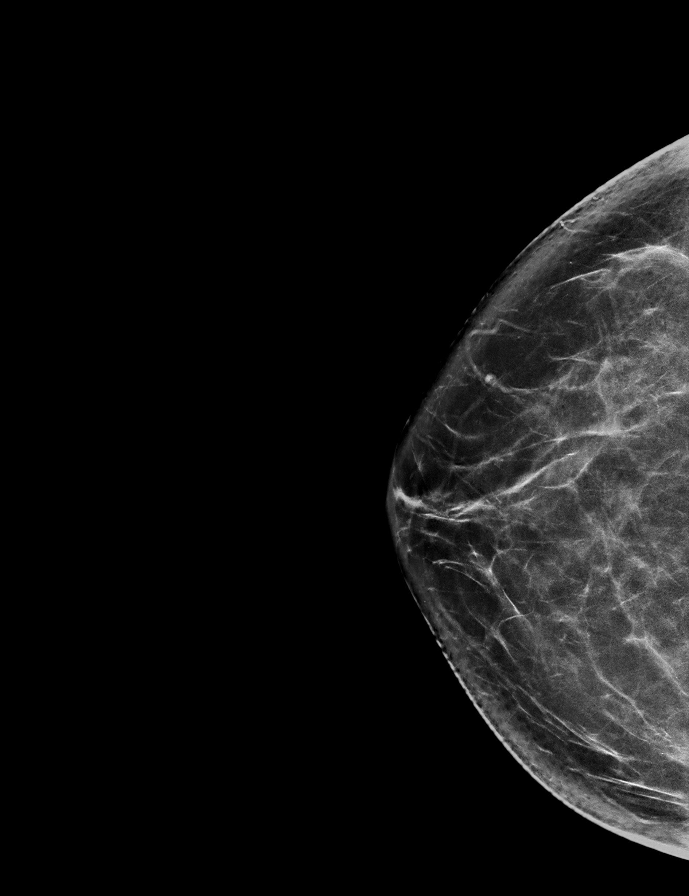

[L CC]
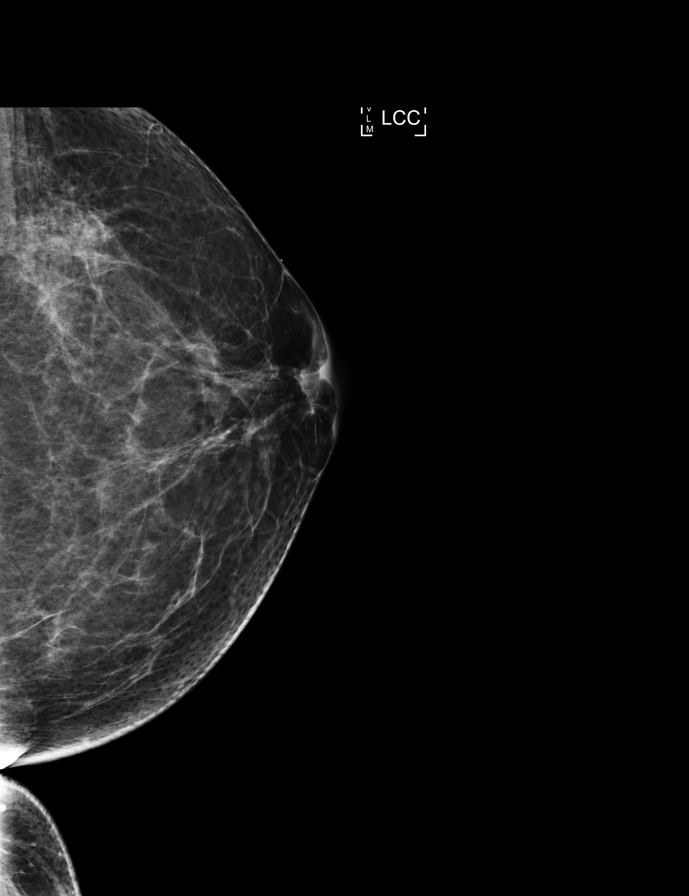

[L CC tomo · tomo slice 43/85.0]
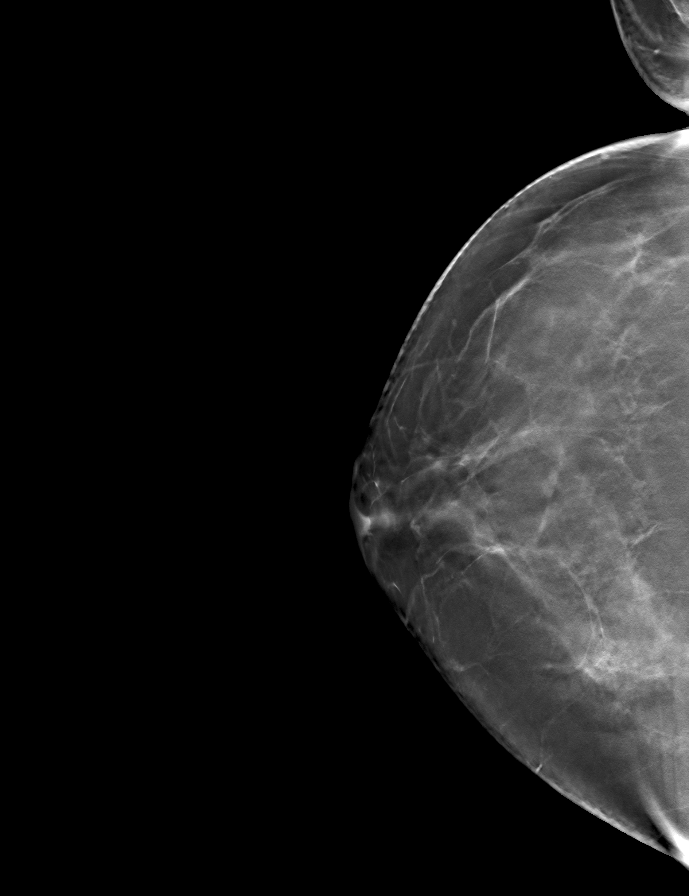

[9 of 28 positions shown; findings below may reference images not displayed]

ACR Breast Density Category b: There are scattered areas of
fibroglandular density.
FINDINGS: There are no findings suspicious for malignancy. Images were
processed with CAD.
IMPRESSION: No mammographic evidence of malignancy. A result letter of this
screening mammogram will be mailed directly to the patient.

RECOMMENDATION:
Screening mammogram in one year. (Code:55-L-23V)

BI-RADS CATEGORY  1: Negative.

## 2016-03-28 DIAGNOSIS — K080 Exfoliation of teeth due to systemic causes: Secondary | ICD-10-CM | POA: Diagnosis not present

## 2016-04-04 DIAGNOSIS — J301 Allergic rhinitis due to pollen: Secondary | ICD-10-CM | POA: Diagnosis not present

## 2016-04-14 DIAGNOSIS — J301 Allergic rhinitis due to pollen: Secondary | ICD-10-CM | POA: Diagnosis not present

## 2016-04-21 DIAGNOSIS — J301 Allergic rhinitis due to pollen: Secondary | ICD-10-CM | POA: Diagnosis not present

## 2016-04-28 DIAGNOSIS — J301 Allergic rhinitis due to pollen: Secondary | ICD-10-CM | POA: Diagnosis not present

## 2016-05-05 DIAGNOSIS — J301 Allergic rhinitis due to pollen: Secondary | ICD-10-CM | POA: Diagnosis not present

## 2016-05-06 DIAGNOSIS — J301 Allergic rhinitis due to pollen: Secondary | ICD-10-CM | POA: Diagnosis not present

## 2016-05-12 DIAGNOSIS — J301 Allergic rhinitis due to pollen: Secondary | ICD-10-CM | POA: Diagnosis not present

## 2016-05-19 DIAGNOSIS — J301 Allergic rhinitis due to pollen: Secondary | ICD-10-CM | POA: Diagnosis not present

## 2016-05-30 DIAGNOSIS — J301 Allergic rhinitis due to pollen: Secondary | ICD-10-CM | POA: Diagnosis not present

## 2016-06-09 ENCOUNTER — Other Ambulatory Visit: Payer: Self-pay | Admitting: Internal Medicine

## 2016-06-09 DIAGNOSIS — Z1231 Encounter for screening mammogram for malignant neoplasm of breast: Secondary | ICD-10-CM

## 2016-06-09 DIAGNOSIS — J301 Allergic rhinitis due to pollen: Secondary | ICD-10-CM | POA: Diagnosis not present

## 2016-06-20 ENCOUNTER — Encounter: Payer: Self-pay | Admitting: Internal Medicine

## 2016-06-20 ENCOUNTER — Ambulatory Visit (INDEPENDENT_AMBULATORY_CARE_PROVIDER_SITE_OTHER): Payer: BLUE CROSS/BLUE SHIELD | Admitting: Internal Medicine

## 2016-06-20 VITALS — BP 114/76 | HR 71 | Temp 98.0°F | Resp 16 | Ht 63.0 in | Wt 154.0 lb

## 2016-06-20 DIAGNOSIS — R5383 Other fatigue: Secondary | ICD-10-CM

## 2016-06-20 DIAGNOSIS — E7800 Pure hypercholesterolemia, unspecified: Secondary | ICD-10-CM

## 2016-06-20 DIAGNOSIS — Z0000 Encounter for general adult medical examination without abnormal findings: Secondary | ICD-10-CM

## 2016-06-20 DIAGNOSIS — E559 Vitamin D deficiency, unspecified: Secondary | ICD-10-CM

## 2016-06-20 DIAGNOSIS — J301 Allergic rhinitis due to pollen: Secondary | ICD-10-CM | POA: Diagnosis not present

## 2016-06-20 LAB — CBC WITH DIFFERENTIAL/PLATELET
Basophils Absolute: 0.1 10*3/uL (ref 0.0–0.1)
Basophils Relative: 1 % (ref 0.0–3.0)
EOS PCT: 4 % (ref 0.0–5.0)
Eosinophils Absolute: 0.2 10*3/uL (ref 0.0–0.7)
HCT: 44.9 % (ref 36.0–46.0)
Hemoglobin: 15.3 g/dL — ABNORMAL HIGH (ref 12.0–15.0)
LYMPHS ABS: 1.8 10*3/uL (ref 0.7–4.0)
Lymphocytes Relative: 32.5 % (ref 12.0–46.0)
MCHC: 34 g/dL (ref 30.0–36.0)
MCV: 90.4 fl (ref 78.0–100.0)
MONO ABS: 0.4 10*3/uL (ref 0.1–1.0)
Monocytes Relative: 6.5 % (ref 3.0–12.0)
NEUTROS PCT: 56 % (ref 43.0–77.0)
Neutro Abs: 3.1 10*3/uL (ref 1.4–7.7)
Platelets: 321 10*3/uL (ref 150.0–400.0)
RBC: 4.96 Mil/uL (ref 3.87–5.11)
RDW: 12.9 % (ref 11.5–15.5)
WBC: 5.5 10*3/uL (ref 4.0–10.5)

## 2016-06-20 LAB — COMPREHENSIVE METABOLIC PANEL
ALK PHOS: 72 U/L (ref 39–117)
ALT: 16 U/L (ref 0–35)
AST: 18 U/L (ref 0–37)
Albumin: 4.5 g/dL (ref 3.5–5.2)
BUN: 11 mg/dL (ref 6–23)
CO2: 29 meq/L (ref 19–32)
Calcium: 9.6 mg/dL (ref 8.4–10.5)
Chloride: 106 mEq/L (ref 96–112)
Creatinine, Ser: 0.62 mg/dL (ref 0.40–1.20)
GFR: 103.66 mL/min (ref >60.00)
GLUCOSE: 97 mg/dL (ref 70–99)
POTASSIUM: 4.3 meq/L (ref 3.5–5.1)
Sodium: 142 mEq/L (ref 135–145)
TOTAL PROTEIN: 6.9 g/dL (ref 6.0–8.3)
Total Bilirubin: 0.7 mg/dL (ref 0.2–1.2)

## 2016-06-20 LAB — TSH: TSH: 1.14 u[IU]/mL (ref 0.35–4.50)

## 2016-06-20 LAB — LIPID PANEL
CHOLESTEROL: 188 mg/dL (ref 0–200)
HDL: 59.5 mg/dL (ref >39.00)
LDL CALC: 103 mg/dL — AB (ref 0–99)
NONHDL: 128.77
Total CHOL/HDL Ratio: 3
Triglycerides: 130 mg/dL (ref 0.0–149.0)
VLDL: 26 mg/dL (ref 0.0–40.0)

## 2016-06-20 LAB — VITAMIN D 25 HYDROXY (VIT D DEFICIENCY, FRACTURES): VITD: 33.76 ng/mL (ref 30.00–100.00)

## 2016-06-20 NOTE — Patient Instructions (Addendum)
Try taking turmeric 1500 mg daily In divided doses for arthritis pain . Ok to combine with tylenol ES      Health Maintenance for Postmenopausal Women Menopause is a normal process in which your reproductive ability comes to an end. This process happens gradually over a span of months to years, usually between the ages of 97 and 51. Menopause is complete when you have missed 12 consecutive menstrual periods. It is important to talk with your health care provider about some of the most common conditions that affect postmenopausal women, such as heart disease, cancer, and bone loss (osteoporosis). Adopting a healthy lifestyle and getting preventive care can help to promote your health and wellness. Those actions can also lower your chances of developing some of these common conditions. What should I know about menopause? During menopause, you may experience a number of symptoms, such as:  Moderate-to-severe hot flashes.  Night sweats.  Decrease in sex drive.  Mood swings.  Headaches.  Tiredness.  Irritability.  Memory problems.  Insomnia. Choosing to treat or not to treat menopausal changes is an individual decision that you make with your health care provider. What should I know about hormone replacement therapy and supplements? Hormone therapy products are effective for treating symptoms that are associated with menopause, such as hot flashes and night sweats. Hormone replacement carries certain risks, especially as you become older. If you are thinking about using estrogen or estrogen with progestin treatments, discuss the benefits and risks with your health care provider. What should I know about heart disease and stroke? Heart disease, heart attack, and stroke become more likely as you age. This may be due, in part, to the hormonal changes that your body experiences during menopause. These can affect how your body processes dietary fats, triglycerides, and cholesterol. Heart attack  and stroke are both medical emergencies. There are many things that you can do to help prevent heart disease and stroke:  Have your blood pressure checked at least every 1-2 years. High blood pressure causes heart disease and increases the risk of stroke.  If you are 90-61 years old, ask your health care provider if you should take aspirin to prevent a heart attack or a stroke.  Do not use any tobacco products, including cigarettes, chewing tobacco, or electronic cigarettes. If you need help quitting, ask your health care provider.  It is important to eat a healthy diet and maintain a healthy weight.  Be sure to include plenty of vegetables, fruits, low-fat dairy products, and lean protein.  Avoid eating foods that are high in solid fats, added sugars, or salt (sodium).  Get regular exercise. This is one of the most important things that you can do for your health.  Try to exercise for at least 150 minutes each week. The type of exercise that you do should increase your heart rate and make you sweat. This is known as moderate-intensity exercise.  Try to do strengthening exercises at least twice each week. Do these in addition to the moderate-intensity exercise.  Know your numbers.Ask your health care provider to check your cholesterol and your blood glucose. Continue to have your blood tested as directed by your health care provider. What should I know about cancer screening? There are several types of cancer. Take the following steps to reduce your risk and to catch any cancer development as early as possible. Breast Cancer  Practice breast self-awareness.  This means understanding how your breasts normally appear and feel.  It also means  doing regular breast self-exams. Let your health care provider know about any changes, no matter how small.  If you are 29 or older, have a clinician do a breast exam (clinical breast exam or CBE) every year. Depending on your age, family history,  and medical history, it may be recommended that you also have a yearly breast X-ray (mammogram).  If you have a family history of breast cancer, talk with your health care provider about genetic screening.  If you are at high risk for breast cancer, talk with your health care provider about having an MRI and a mammogram every year.  Breast cancer (BRCA) gene test is recommended for women who have family members with BRCA-related cancers. Results of the assessment will determine the need for genetic counseling and BRCA1 and for BRCA2 testing. BRCA-related cancers include these types:  Breast. This occurs in males or females.  Ovarian.  Tubal. This may also be called fallopian tube cancer.  Cancer of the abdominal or pelvic lining (peritoneal cancer).  Prostate.  Pancreatic. Cervical, Uterine, and Ovarian Cancer  Your health care provider may recommend that you be screened regularly for cancer of the pelvic organs. These include your ovaries, uterus, and vagina. This screening involves a pelvic exam, which includes checking for microscopic changes to the surface of your cervix (Pap test).  For women ages 21-65, health care providers may recommend a pelvic exam and a Pap test every three years. For women ages 64-65, they may recommend the Pap test and pelvic exam, combined with testing for human papilloma virus (HPV), every five years. Some types of HPV increase your risk of cervical cancer. Testing for HPV may also be done on women of any age who have unclear Pap test results.  Other health care providers may not recommend any screening for nonpregnant women who are considered low risk for pelvic cancer and have no symptoms. Ask your health care provider if a screening pelvic exam is right for you.  If you have had past treatment for cervical cancer or a condition that could lead to cancer, you need Pap tests and screening for cancer for at least 20 years after your treatment. If Pap tests  have been discontinued for you, your risk factors (such as having a new sexual partner) need to be reassessed to determine if you should start having screenings again. Some women have medical problems that increase the chance of getting cervical cancer. In these cases, your health care provider may recommend that you have screening and Pap tests more often.  If you have a family history of uterine cancer or ovarian cancer, talk with your health care provider about genetic screening.  If you have vaginal bleeding after reaching menopause, tell your health care provider.  There are currently no reliable tests available to screen for ovarian cancer. Lung Cancer  Lung cancer screening is recommended for adults 5-52 years old who are at high risk for lung cancer because of a history of smoking. A yearly low-dose CT scan of the lungs is recommended if you:  Currently smoke.  Have a history of at least 30 pack-years of smoking and you currently smoke or have quit within the past 15 years. A pack-year is smoking an average of one pack of cigarettes per day for one year. Yearly screening should:  Continue until it has been 15 years since you quit.  Stop if you develop a health problem that would prevent you from having lung cancer treatment. Colorectal Cancer  This type of cancer can be detected and can often be prevented.  Routine colorectal cancer screening usually begins at age 32 and continues through age 39.  If you have risk factors for colon cancer, your health care provider may recommend that you be screened at an earlier age.  If you have a family history of colorectal cancer, talk with your health care provider about genetic screening.  Your health care provider may also recommend using home test kits to check for hidden blood in your stool.  A small camera at the end of a tube can be used to examine your colon directly (sigmoidoscopy or colonoscopy). This is done to check for the  earliest forms of colorectal cancer.  Direct examination of the colon should be repeated every 5-10 years until age 66. However, if early forms of precancerous polyps or small growths are found or if you have a family history or genetic risk for colorectal cancer, you may need to be screened more often. Skin Cancer  Check your skin from head to toe regularly.  Monitor any moles. Be sure to tell your health care provider:  About any new moles or changes in moles, especially if there is a change in a mole's shape or color.  If you have a mole that is larger than the size of a pencil eraser.  If any of your family members has a history of skin cancer, especially at a young age, talk with your health care provider about genetic screening.  Always use sunscreen. Apply sunscreen liberally and repeatedly throughout the day.  Whenever you are outside, protect yourself by wearing long sleeves, pants, a wide-brimmed hat, and sunglasses. What should I know about osteoporosis? Osteoporosis is a condition in which bone destruction happens more quickly than new bone creation. After menopause, you may be at an increased risk for osteoporosis. To help prevent osteoporosis or the bone fractures that can happen because of osteoporosis, the following is recommended:  If you are 61-25 years old, get at least 1,000 mg of calcium and at least 600 mg of vitamin D per day.  If you are older than age 61 but younger than age 63, get at least 1,200 mg of calcium and at least 600 mg of vitamin D per day.  If you are older than age 12, get at least 1,200 mg of calcium and at least 800 mg of vitamin D per day. Smoking and excessive alcohol intake increase the risk of osteoporosis. Eat foods that are rich in calcium and vitamin D, and do weight-bearing exercises several times each week as directed by your health care provider. What should I know about how menopause affects my mental health? Depression may occur at any  age, but it is more common as you become older. Common symptoms of depression include:  Low or sad mood.  Changes in sleep patterns.  Changes in appetite or eating patterns.  Feeling an overall lack of motivation or enjoyment of activities that you previously enjoyed.  Frequent crying spells. Talk with your health care provider if you think that you are experiencing depression. What should I know about immunizations? It is important that you get and maintain your immunizations. These include:  Tetanus, diphtheria, and pertussis (Tdap) booster vaccine.  Influenza every year before the flu season begins.  Pneumonia vaccine.  Shingles vaccine. Your health care provider may also recommend other immunizations. This information is not intended to replace advice given to you by your health care provider. Make  sure you discuss any questions you have with your health care provider. Document Released: 03/25/2005 Document Revised: 08/21/2015 Document Reviewed: 11/04/2014 Elsevier Interactive Patient Education  2017 [employer].

## 2016-06-20 NOTE — Progress Notes (Signed)
Pre visit review using our clinic review tool, if applicable. No additional management support is needed unless otherwise documented below in the visit note. 

## 2016-06-20 NOTE — Progress Notes (Signed)
Patient ID: Jean Luna, female    DOB: 04-Aug-1955  Age: 61 y.o. MRN: [id number]  The patient is here for annualwellness examination and management of other chronic and acute problems.  Pap normal 2016 Hagerman every 2 days .   Mammogram ordered MAY 24  Has had breast exam  Colonosocopy done 2018  Polyps found,  Follow up 5 yrs [facility]  [facility] of brca mgm in 1948   Asthma:  using Breo  Daily ,  Albuterol less than once a month Sinus surgery  last one in 2009.   made snoring worse.  Uses a daily sinus irrigation   Sleep study done 2016 normal  Takes mucinex 2-3 week   Last eye exam   August 2017 with [facility] in [place].  Uses magnifiers        The risk factors are reflected in the social history.  The roster of all physicians providing medical care to patient - is listed in the Snapshot section of the chart.  Activities of daily living:  The patient is 100% independent in all ADLs: dressing, toileting, feeding as well as independent mobility  Home safety : The patient has smoke detectors in the home. They wear seatbelts.  There are secured firearms at home. There is no violence in the home.   There is no risks for hepatitis, STDs or HIV. There is no   history of blood transfusion. They have no travel history to infectious disease endemic areas of the world.  The patient has seen their dentist in the last six month. They have seen their eye doctor in the last year.    They do not  have excessive sun exposure. Discussed the need for sun protection: hats, long sleeves and use of sunscreen if there is significant sun exposure.   Diet: the importance of a healthy diet is discussed. They do have a healthy diet.  The benefits of regular aerobic exercise were discussed. She walks 4 times per week ,  20 minutes.   Depression screen: there are no signs or vegative symptoms of depression- irritability, change in appetite, anhedonia, sadness/tearfullness.  The  following portions of the patient's history were reviewed and updated as appropriate: allergies, current medications, past family history, past medical history,  past surgical history, past social history  and problem list.  Visual acuity was not assessed per patient preference since she has regular follow up with her ophthalmologist. Hearing and body mass index were assessed and reviewed.   During the course of the visit the patient was educated and counseled about appropriate screening and preventive services including : fall prevention , diabetes screening, nutrition counseling, colorectal cancer screening, and recommended immunizations.    CC: The primary encounter diagnosis was Fatigue, unspecified type. Diagnoses of Vitamin D deficiency, Pure hypercholesterolemia, and Encounter for preventive health examination were also pertinent to this visit.  History Jean Luna has a past medical history of Asthma; Cervical disc herniation; Facial paralysis/Bells palsy; Fibroid; History of abnormal cervical Pap smear; History of colon polyps; Hypertension; Migraine, menstrual; and Raynaud phenomenon.   She has a past surgical history that includes Nasal sinus surgery; Cervical cone biopsy (1985); Cervical biopsy w/ loop electrode excision (1991); Cervical disc surgery (11/2012); Colposcopy (1984); and Finger surgery (Left, 06/2014).   Her family history includes Breast cancer (age of onset: 10) in her maternal grandmother; COPD (age of onset: 67) in her father; Celiac disease in her daughter; Diabetes in her father and paternal grandfather;  Heart attack in her paternal grandfather; Heart attack (age of onset: 67) in her father; Heart disease in her father, paternal grandfather, and paternal uncle; Heart failure in her father and paternal grandfather; Hypertension in her mother; Stroke (age of onset: 83) in her mother.She reports that she has never smoked. She has never used smokeless tobacco. She reports that she  does not drink alcohol or use drugs.  Outpatient Medications Prior to Visit  Medication Sig Dispense Refill  . ALBUTEROL SULFATE HFA IN Inhale 2 puffs into the lungs every 6 (six) hours as needed.    . Ascorbic Acid (VITAMIN C) 100 MG tablet Take 100 mg by mouth daily.    . Azelastine-Fluticasone (DYMISTA) 137-50 MCG/ACT SUSP Place 1 spray into both nostrils daily.    . Biotin 2500 MCG CAPS Take 1 capsule by mouth daily.    . Calcium Carbonate (CALTRATE 600 PO) Take 1 tablet by mouth daily.    Marland Kitchen EPIPEN 2-PAK 0.3 MG/0.3ML SOAJ Reported on 07/15/2015    . famotidine (PEPCID) 40 MG tablet Take 1 tablet (40 mg total) by mouth 2 (two) times daily. For 5 days 10 tablet 1  . montelukast (SINGULAIR) 10 MG tablet   10  . Multiple Vitamin (MULTIVITAMIN) capsule Take 1 capsule by mouth daily.    Marland Kitchen PRESCRIPTION MEDICATION Allergy Shots weekly    . progesterone (PROMETRIUM) 100 MG capsule Take 1 capsule (100 mg total) by mouth daily. 90 capsule 3  . telmisartan (MICARDIS) 40 MG tablet TAKE 1 TABLET BY MOUTH DAILY 90 tablet 3  . vitamin B-12 (CYANOCOBALAMIN) 100 MCG tablet Take 100 mcg by mouth daily.    . ergocalciferol (DRISDOL) 50000 units capsule Take 1 capsule (50,000 Units total) by mouth once a week. (Patient not taking: Reported on 06/20/2016) 4 capsule 0  . estradiol (VIVELLE-DOT) 0.025 MG/24HR Place once half patch to skin twice a week. (Patient not taking: Reported on 06/20/2016) 12 patch 3  . fluticasone-salmeterol (ADVAIR HFA) 115-21 MCG/ACT inhaler Inhale 2 puffs into the lungs 2 (two) times daily.    . predniSONE (DELTASONE) 10 MG tablet Take 4 tablets ( total 40 mg) by mouth for 2 days; take 3 tablets ( total 30 mg) by mouth for 2 days; take 2 tablets (total 40m) by mouth for 2 days; then take 1 tablet ( total 188m by mouth for 2 days. (Patient not taking: Reported on 06/20/2016) 20 tablet 0   No facility-administered medications prior to visit.     Review of Systems   Patient denies  headache, fevers, malaise, unintentional weight loss, skin rash, eye pain, sinus congestion and sinus pain, sore throat, dysphagia,  hemoptysis , cough, dyspnea, wheezing, chest pain, palpitations, orthopnea, edema, abdominal pain, nausea, melena, diarrhea, constipation, flank pain, dysuria, hematuria, urinary  Frequency, nocturia, numbness, tingling, seizures,  Focal weakness, Loss of consciousness,  Tremor, insomnia, depression, anxiety, and suicidal ideation.      Objective:  BP 114/76 (BP Location: Left Arm, Patient Position: Sitting, Cuff Size: Normal)   Pulse 71   Temp 98 F (36.7 C) (Oral)   Resp 16   Ht '5\' 3"'  (1.6 m)   Wt 154 lb (69.9 kg)   LMP 02/15/2003   SpO2 97%   BMI 27.28 kg/m   Physical Exam   General appearance: alert, cooperative and appears stated age Head: Normocephalic, without obvious abnormality, atraumatic Eyes: conjunctivae/corneas clear. PERRL, EOM's intact. Fundi benign. Ears: normal TM's and external ear canals both ears Nose: Nares normal. Septum midline.  Mucosa normal. No drainage or sinus tenderness. Throat: lips, mucosa, and tongue normal; teeth and gums normal Neck: no adenopathy, no carotid bruit, no JVD, supple, symmetrical, trachea midline and thyroid not enlarged, symmetric, no tenderness/mass/nodules Lungs: clear to auscultation bilaterally Breasts: normal appearance, no masses or tenderness Heart: regular rate and rhythm, S1, S2 normal, no murmur, click, rub or gallop Abdomen: soft, non-tender; bowel sounds normal; no masses,  no organomegaly Extremities: extremities normal, atraumatic, no cyanosis or edema Pulses: 2+ and symmetric Skin: Skin color, texture, turgor normal. No rashes or lesions Neurologic: Alert and oriented X 3, normal strength and tone. Normal symmetric reflexes. Normal coordination and gait.      Assessment & Plan:   Problem List Items Addressed This Visit    Encounter for preventive health examination    Annual  comprehensive preventive exam was done as well as an evaluation and management of chronic conditions .  During the course of the visit the patient was educated and counseled about appropriate screening and preventive services including :  diabetes screening, lipid analysis with projected  10 year  risk for CAD , nutrition counseling, breast, cervical and colorectal cancer screening, and recommended immunizations.  Printed recommendations for health maintenance screenings was given  Lab Results  Component Value Date   CHOL 188 06/20/2016   HDL 59.50 06/20/2016   LDLCALC 103 (H) 06/20/2016   LDLDIRECT 137.0 07/04/2014   TRIG 130.0 06/20/2016   CHOLHDL 3 06/20/2016   Lab Results  Component Value Date   TSH 1.14 06/20/2016         Vitamin D deficiency   Relevant Orders   VITAMIN D 25 Hydroxy (Vit-D Deficiency, Fractures) (Completed)    Other Visit Diagnoses    Fatigue, unspecified type    -  Primary   Relevant Orders   Comprehensive metabolic panel (Completed)   TSH (Completed)   CBC with Differential/Platelet (Completed)   Pure hypercholesterolemia       Relevant Orders   Lipid panel (Completed)      I have discontinued Ms. Stanczak fluticasone-salmeterol, ergocalciferol, estradiol, and predniSONE. I am also having her maintain her Azelastine-Fluticasone, EPIPEN 2-PAK, Calcium Carbonate (CALTRATE 600 PO), multivitamin, ALBUTEROL SULFATE HFA IN, vitamin B-12, PRESCRIPTION MEDICATION, Biotin, telmisartan, famotidine, montelukast, vitamin C, progesterone, calcium citrate-vitamin D, fluticasone furoate-vilanterol, and cholecalciferol.  Meds ordered this encounter  Medications  . calcium citrate-vitamin D (CITRACAL+D) 315-200 MG-UNIT tablet    Sig: Take by mouth.  . fluticasone furoate-vilanterol (BREO ELLIPTA) 100-25 MCG/INH AEPB    Sig: Inhale 1 puff into the lungs daily.  . cholecalciferol (VITAMIN D) 1000 units tablet    Sig: Take 1,000 Units by mouth daily.     Medications Discontinued During This Encounter  Medication Reason  . ergocalciferol (DRISDOL) 50000 units capsule Therapy completed  . estradiol (VIVELLE-DOT) 0.025 MG/24HR Patient has not taken in last 30 days  . fluticasone-salmeterol (ADVAIR HFA) 115-21 MCG/ACT inhaler Patient has not taken in last 30 days  . predniSONE (DELTASONE) 10 MG tablet Therapy completed    Follow-up: Return in about 6 months (around 12/21/2016).   Crecencio Mc, MD

## 2016-06-21 LAB — HM COLONOSCOPY

## 2016-06-22 NOTE — Assessment & Plan Note (Signed)
Annual comprehensive preventive exam was done as well as an evaluation and management of chronic conditions .  During the course of the visit the patient was educated and counseled about appropriate screening and preventive services including :  diabetes screening, lipid analysis with projected  10 year  risk for CAD , nutrition counseling, breast, cervical and colorectal cancer screening, and recommended immunizations.  Printed recommendations for health maintenance screenings was given  Lab Results  Component Value Date   CHOL 188 06/20/2016   HDL 59.50 06/20/2016   LDLCALC 103 (H) 06/20/2016   LDLDIRECT 137.0 07/04/2014   TRIG 130.0 06/20/2016   CHOLHDL 3 06/20/2016   Lab Results  Component Value Date   TSH 1.14 06/20/2016

## 2016-06-27 DIAGNOSIS — J301 Allergic rhinitis due to pollen: Secondary | ICD-10-CM | POA: Diagnosis not present

## 2016-07-04 DIAGNOSIS — J301 Allergic rhinitis due to pollen: Secondary | ICD-10-CM | POA: Diagnosis not present

## 2016-07-05 NOTE — Progress Notes (Signed)
Cardiology Office Note  Date:  07/06/2016   ID:  Jean Luna, Jean Luna Mar 01, 1955, MRN [id number]  PCP:  Crecencio Mc, MD   Chief Complaint  Patient presents with  . OTHER    12 month f/u pt is feeling well no complaints today. Meds reviewed verbally with pt.    HPI:  Jean Luna is a pleasant 61 year old woman with a long history of  severe asthma, Calcium score of zero in 2017 snoring, sinus issues followed by Dr. Tami Ribas,   seen for severe shortness of breath,  who presents for discussion of her breathing and family history of coronary artery disease  She reports her asthma is stable Uses inhalers, various medications for allergies Wears a mask in the allergy season  Active, denies chest pain or shortness of breath on exertion  family history of coronary artery disease as numerous family members have had heart attack or bypass surgery  Previous sleeping study  Labs reviewed total cholesterol 180  EKG personally reviewed by myself on today's visit shows normal sinus rhythm with rate 72 bpm, no significant ST or T-wave changes  Other past medical history long extensive history of allergy-induced asthma. Previously treated with prednisone  Father was a previous smoker, had COPD also with cardiac issues, CABG Mother had no coronary disease Patient has a daughter with total cholesterol 260   PMH:   has a past medical history of Asthma; Cervical disc herniation; Facial paralysis/Bells palsy; Fibroid; History of abnormal cervical Pap smear; History of colon polyps; Hypertension; Migraine, menstrual; and Raynaud phenomenon.  PSH:    Past Surgical History:  Procedure Laterality Date  . CERVICAL BIOPSY  W/ LOOP ELECTRODE EXCISION  1991  . CERVICAL CONE BIOPSY  1985  . CERVICAL DISC SURGERY  11/2012   --removed bone spurs, bulging disc  . COLPOSCOPY  1984   no treatment  . FINGER SURGERY Left 06/2014   Left index finger.   Marland Kitchen NASAL SINUS SURGERY       Current Outpatient Prescriptions  Medication Sig Dispense Refill  . ALBUTEROL SULFATE HFA IN Inhale 2 puffs into the lungs every 6 (six) hours as needed.    . Ascorbic Acid (VITAMIN C) 100 MG tablet Take 100 mg by mouth daily.    . Azelastine-Fluticasone (DYMISTA) 137-50 MCG/ACT SUSP Place 1 spray into both nostrils daily.    . Biotin 2500 MCG CAPS Take 1 capsule by mouth daily.    . Calcium Carbonate (CALTRATE 600 PO) Take 1 tablet by mouth daily.    . calcium citrate-vitamin D (CITRACAL+D) 315-200 MG-UNIT tablet Take by mouth.    . cholecalciferol (VITAMIN D) 1000 units tablet Take 1,000 Units by mouth daily.    Marland Kitchen EPIPEN 2-PAK 0.3 MG/0.3ML SOAJ Reported on 07/15/2015    . famotidine (PEPCID) 40 MG tablet Take 1 tablet (40 mg total) by mouth 2 (two) times daily. For 5 days 10 tablet 1  . fluticasone furoate-vilanterol (BREO ELLIPTA) 100-25 MCG/INH AEPB Inhale 1 puff into the lungs daily.    . montelukast (SINGULAIR) 10 MG tablet   10  . Multiple Vitamin (MULTIVITAMIN) capsule Take 1 capsule by mouth daily.    Marland Kitchen PRESCRIPTION MEDICATION Allergy Shots weekly    . progesterone (PROMETRIUM) 100 MG capsule Take 1 capsule (100 mg total) by mouth daily. 90 capsule 3  . telmisartan (MICARDIS) 40 MG tablet TAKE 1 TABLET BY MOUTH DAILY 90 tablet 3  . vitamin B-12 (CYANOCOBALAMIN) 100 MCG tablet Take 100 mcg by mouth  daily.     No current facility-administered medications for this visit.      Allergies:   Shellfish allergy   Social History:  The patient  reports that she has never smoked. She has never used smokeless tobacco. She reports that she does not drink alcohol or use drugs.   Family History:   family history includes Breast cancer (age of onset: 51) in her maternal grandmother; COPD (age of onset: 26) in her father; Celiac disease in her daughter; Diabetes in her father and paternal grandfather; Heart attack in her paternal grandfather; Heart attack (age of onset: 71) in her father;  Heart disease in her father, paternal grandfather, and paternal uncle; Heart failure in her father and paternal grandfather; Hypertension in her mother; Stroke (age of onset: 25) in her mother.    Review of Systems: Review of Systems  Constitutional: Negative.   Respiratory: Negative.   Cardiovascular: Negative.   Gastrointestinal: Negative.   Musculoskeletal: Negative.   Neurological: Negative.   Psychiatric/Behavioral: Negative.   All other systems reviewed and are negative.    PHYSICAL EXAM: VS:  BP 118/82 (BP Location: Left Arm, Patient Position: Sitting, Cuff Size: Normal)   Pulse 72   Ht 5\' 3"  (1.6 m)   Wt 153 lb 8 oz (69.6 kg)   LMP 02/15/2003   BMI 27.19 kg/m  , BMI Body mass index is 27.19 kg/m. GEN: Well nourished, well developed, in no acute distress  HEENT: normal  Neck: no JVD, carotid bruits, or masses Cardiac: RRR; no murmurs, rubs, or gallops,no edema  Respiratory:  clear to auscultation bilaterally, normal work of breathing GI: soft, nontender, nondistended, + BS MS: no deformity or atrophy  Skin: warm and dry, no rash Neuro:  Strength and sensation are intact Psych: euthymic mood, full affect    Recent Labs: 06/20/2016: ALT 16; BUN 11; Creatinine, Ser 0.62; Hemoglobin 15.3; Platelets 321.0; Potassium 4.3; Sodium 142; TSH 1.14    Lipid Panel Lab Results  Component Value Date   CHOL 188 06/20/2016   HDL 59.50 06/20/2016   LDLCALC 103 (H) 06/20/2016   TRIG 130.0 06/20/2016      Wt Readings from Last 3 Encounters:  07/06/16 153 lb 8 oz (69.6 kg)  06/20/16 154 lb (69.9 kg)  10/27/15 148 lb 9.6 oz (67.4 kg)       ASSESSMENT AND PLAN:  Essential hypertension - Plan: EKG 12-Lead Blood pressure is well controlled on today's visit. No changes made to the medications.  Shortness of breath - Stable, periodic flareup of asthma discussed family history with her,  CT coronary calcium score of 0   Family history of coronary arteriosclerosis - Plan:  EKG 12-Lead  Chest tightness -  this has not been a issue recently, no further testing needed  Disposition:   F/U  12 months as needed   Total encounter time more than 15 minutes  Greater than 50% was spent in counseling and coordination of care with the patient    Orders Placed This Encounter  Procedures  . EKG 12-Lead     Signed, Esmond Plants, M.D., Ph.D. 07/06/2016  [place], [place]

## 2016-07-06 ENCOUNTER — Encounter: Payer: Self-pay | Admitting: Cardiovascular Disease

## 2016-07-06 ENCOUNTER — Ambulatory Visit (INDEPENDENT_AMBULATORY_CARE_PROVIDER_SITE_OTHER): Payer: BLUE CROSS/BLUE SHIELD | Admitting: Cardiovascular Disease

## 2016-07-06 VITALS — BP 118/82 | HR 72 | Ht 63.0 in | Wt 153.5 lb

## 2016-07-06 DIAGNOSIS — I10 Essential (primary) hypertension: Secondary | ICD-10-CM | POA: Diagnosis not present

## 2016-07-06 DIAGNOSIS — R0602 Shortness of breath: Secondary | ICD-10-CM

## 2016-07-06 DIAGNOSIS — Z8249 Family history of ischemic heart disease and other diseases of the circulatory system: Secondary | ICD-10-CM

## 2016-07-06 DIAGNOSIS — R0789 Other chest pain: Secondary | ICD-10-CM | POA: Diagnosis not present

## 2016-07-06 NOTE — Patient Instructions (Signed)

## 2016-07-12 ENCOUNTER — Ambulatory Visit
Admission: RE | Admit: 2016-07-12 | Discharge: 2016-07-12 | Disposition: A | Discharge status: Home / Self Care | Payer: BLUE CROSS/BLUE SHIELD | Source: Ambulatory Visit | Attending: Internal Medicine | Admitting: Internal Medicine

## 2016-07-12 DIAGNOSIS — Z1231 Encounter for screening mammogram for malignant neoplasm of breast: Secondary | ICD-10-CM | POA: Insufficient documentation

## 2016-07-13 DIAGNOSIS — J301 Allergic rhinitis due to pollen: Secondary | ICD-10-CM | POA: Diagnosis not present

## 2016-07-14 ENCOUNTER — Encounter: Payer: Self-pay | Admitting: Internal Medicine

## 2016-07-21 NOTE — Progress Notes (Signed)
61 y.o. G30P1031 Married Caucasian female here for annual exam.    On Prometrium only.  Stopped the Vivelle Dot.  Wants to continue this.  Denies bleeding.   Mild GSI with current cough. Otherwise doing well with bladder control.   Has allergies and sinusitis issues.   Cholesterol 183 with PCP.   Husband had shoulder replacement.   PCP:   Deborra Medina, MD  Patient's last menstrual period was 02/15/2003.           Sexually active: Yes.    The current method of family planning is post menopausal status.    Exercising: Yes.    walking Smoker:  no  Health Maintenance: Pap:  06/26/14 Neg. HR HPV: neg History of abnormal Pap:  Yes LEEP 1991 MMG:  07/12/16 BIRADS 1 negative/density b Colonoscopy:  03/2016 -- Polyps; repeat in 5 years per patient BMD:  Per patient had one years ago TDaP:  06/2013 Gardasil:   N/A HIV: 06/19/15 Negative Hep C: 06/19/15 Negative Screening Labs:  Hb today: PCP takes care of lab   reports that she has never smoked. She has never used smokeless tobacco. She reports that she does not drink alcohol or use drugs.  Past Medical History:  Diagnosis Date  . Asthma   . Cervical disc herniation   . Facial paralysis/Bells palsy    6 week paralysis of right side of face   . Fibroid   . History of abnormal cervical Pap smear   . History of colon polyps    adenomatous  . Hypertension   . Migraine, menstrual    resolved with menopause  . Raynaud phenomenon     Past Surgical History:  Procedure Laterality Date  . CERVICAL BIOPSY  W/ LOOP ELECTRODE EXCISION  1991  . CERVICAL CONE BIOPSY  1985  . CERVICAL DISC SURGERY  11/2012   --removed bone spurs, bulging disc  . COLPOSCOPY  1984   no treatment  . FINGER SURGERY Left 06/2014   Left index finger.   Marland Kitchen NASAL SINUS SURGERY      Current Outpatient Prescriptions  Medication Sig Dispense Refill  . ALBUTEROL SULFATE HFA IN Inhale 2 puffs into the lungs every 6 (six) hours as needed.    . Ascorbic Acid  (VITAMIN C) 100 MG tablet Take 100 mg by mouth daily.    . Azelastine-Fluticasone (DYMISTA) 137-50 MCG/ACT SUSP Place 1 spray into both nostrils daily.    . Biotin 2500 MCG CAPS Take 1 capsule by mouth daily.    . Calcium Carbonate (CALTRATE 600 PO) Take 1 tablet by mouth daily.    . calcium citrate-vitamin D (CITRACAL+D) 315-200 MG-UNIT tablet Take by mouth.    . cholecalciferol (VITAMIN D) 1000 units tablet Take 1,000 Units by mouth daily.    Marland Kitchen EPIPEN 2-PAK 0.3 MG/0.3ML SOAJ Reported on 07/15/2015    . famotidine (PEPCID) 40 MG tablet Take 1 tablet (40 mg total) by mouth 2 (two) times daily. For 5 days 10 tablet 1  . fluticasone furoate-vilanterol (BREO ELLIPTA) 100-25 MCG/INH AEPB Inhale 1 puff into the lungs daily.    . montelukast (SINGULAIR) 10 MG tablet   10  . Multiple Vitamin (MULTIVITAMIN) capsule Take 1 capsule by mouth daily.    Marland Kitchen PRESCRIPTION MEDICATION Allergy Shots weekly    . progesterone (PROMETRIUM) 100 MG capsule Take 1 capsule (100 mg total) by mouth daily. 90 capsule 3  . telmisartan (MICARDIS) 40 MG tablet TAKE 1 TABLET BY MOUTH DAILY 90 tablet 3  .  vitamin B-12 (CYANOCOBALAMIN) 100 MCG tablet Take 100 mcg by mouth daily.     No current facility-administered medications for this visit.     Family History  Problem Relation Age of Onset  . Hypertension Mother   . Stroke Mother 21       cerebral aneurysm  . Heart disease Father        CABG  . COPD Father 39  . Diabetes Father   . Heart failure Father   . Heart attack Father 61  . Celiac disease Daughter   . Breast cancer Maternal Grandmother 61       dec 89   . Diabetes Paternal Grandfather   . Heart attack Paternal Grandfather   . Heart disease Paternal Grandfather   . Heart failure Paternal Grandfather   . Heart disease Paternal Uncle     ROS:  Pertinent items are noted in HPI.  Otherwise, a comprehensive ROS was negative.  Exam:   BP 124/70 (BP Location: Right Arm, Patient Position: Sitting, Cuff Size:  Normal)   Pulse 92   Resp 16   Ht 5' 3.25" (1.607 m)   Wt 153 lb (69.4 kg)   LMP 02/15/2003   BMI 26.89 kg/m     General appearance: alert, cooperative and appears stated age Head: Normocephalic, without obvious abnormality, atraumatic Neck: no adenopathy, supple, symmetrical, trachea midline and thyroid normal to inspection and palpation Lungs: clear to auscultation bilaterally Breasts: normal appearance, no masses or tenderness, No nipple retraction or dimpling, No nipple discharge or bleeding, No axillary or supraclavicular adenopathy Heart: regular rate and rhythm Abdomen: soft, non-tender; no masses, no organomegaly Extremities: extremities normal, atraumatic, no cyanosis or edema Skin: Skin color, texture, turgor normal. No rashes or lesions Lymph nodes: Cervical, supraclavicular, and axillary nodes normal. No abnormal inguinal nodes palpated Neurologic: Grossly normal  Pelvic: External genitalia:  no lesions              Urethra:  normal appearing urethra with no masses, tenderness or lesions              Bartholins and Skenes: normal                 Vagina: normal appearing vagina with normal color and discharge, no lesions              Cervix: no lesions.  C/w LEEP.              Pap taken: No. Bimanual Exam:  Uterus:  normal size, contour, position, consistency, mobility, non-tender              Adnexa: no mass, fullness, tenderness              Rectal exam: Yes.  .  Confirms.              Anus:  normal sphincter tone, no lesions  Chaperone was present for exam.  Assessment:   Well woman visit with normal exam. HX LEEP.  On Prometrium only.  Mild GSI.   Plan: Mammogram screening discussed. Recommended self breast awareness. Pap and HR HPV as above. Guidelines for Calcium, Vitamin D, regular exercise program including cardiovascular and weight bearing exercise. Will continue Prometrium 100 mg po q day.  Discussed potential risks related to this - MI?  Breast  cancer? BMD ordered.  Follow up annually and prn.   After visit summary provided.

## 2016-07-22 ENCOUNTER — Encounter: Payer: Self-pay | Admitting: Obstetrics and Gynecology

## 2016-07-22 ENCOUNTER — Ambulatory Visit: Payer: BLUE CROSS/BLUE SHIELD | Admitting: Obstetrics and Gynecology

## 2016-07-22 VITALS — BP 124/70 | HR 92 | Resp 16 | Ht 63.25 in | Wt 153.0 lb

## 2016-07-22 DIAGNOSIS — Z780 Asymptomatic menopausal state: Secondary | ICD-10-CM

## 2016-07-22 DIAGNOSIS — Z01419 Encounter for gynecological examination (general) (routine) without abnormal findings: Secondary | ICD-10-CM

## 2016-07-22 MED ORDER — PROGESTERONE MICRONIZED 100 MG PO CAPS: 100.0000 mg | ORAL_CAPSULE | Freq: Every day | ORAL | 3 refills | Status: DC

## 2016-07-22 NOTE — Patient Instructions (Signed)

## 2016-07-25 ENCOUNTER — Encounter: Payer: Self-pay | Admitting: Internal Medicine

## 2016-07-25 ENCOUNTER — Ambulatory Visit (INDEPENDENT_AMBULATORY_CARE_PROVIDER_SITE_OTHER): Payer: BLUE CROSS/BLUE SHIELD

## 2016-07-25 ENCOUNTER — Ambulatory Visit (INDEPENDENT_AMBULATORY_CARE_PROVIDER_SITE_OTHER): Payer: BLUE CROSS/BLUE SHIELD | Admitting: Internal Medicine

## 2016-07-25 VITALS — BP 130/86 | HR 86 | Temp 98.3°F | Resp 15 | Ht 63.25 in | Wt 154.6 lb

## 2016-07-25 DIAGNOSIS — J4541 Moderate persistent asthma with (acute) exacerbation: Secondary | ICD-10-CM

## 2016-07-25 DIAGNOSIS — D126 Benign neoplasm of colon, unspecified: Secondary | ICD-10-CM | POA: Diagnosis not present

## 2016-07-25 DIAGNOSIS — J4540 Moderate persistent asthma, uncomplicated: Secondary | ICD-10-CM | POA: Diagnosis not present

## 2016-07-25 DIAGNOSIS — J45909 Unspecified asthma, uncomplicated: Secondary | ICD-10-CM | POA: Diagnosis not present

## 2016-07-25 MED ORDER — METHYLPREDNISOLONE ACETATE 80 MG/ML IJ SUSP
80.0000 mg | Freq: Once | INTRAMUSCULAR | Status: AC
  Administered 2016-07-25: 80 mg via INTRAMUSCULAR

## 2016-07-25 MED ORDER — HYDROCOD POLST-CPM POLST ER 10-8 MG/5ML PO SUER: 5.0000 mL | Freq: Every evening | ORAL | 0 refills | Status: DC | PRN

## 2016-07-25 MED ORDER — PREDNISONE 10 MG PO TABS
ORAL_TABLET | ORAL | 0 refills | Status: DC
Start: 2016-07-25 — End: 2016-12-21

## 2016-07-25 MED ORDER — AZITHROMYCIN 250 MG PO TABS
ORAL_TABLET | ORAL | 0 refills | Status: DC
Start: 2016-07-25 — End: 2016-11-28

## 2016-07-25 NOTE — Assessment & Plan Note (Addendum)
By 2018 colonoscopy Vira Agar) .  No polyps removed.  Follow uu 5 years

## 2016-07-25 NOTE — Progress Notes (Signed)
Subjective:  Patient ID: Jean Luna, female    DOB: 15-Jan-1956  Age: 61 y.o. MRN: [id number]  CC: The primary encounter diagnosis was Moderate persistent asthma with exacerbation. Diagnoses of Moderate persistent asthma without complication and Adenomatous polyp of colon, unspecified part of colon were also pertinent to this visit.  HPI Jean Luna presents for productive cough accompanied by wheezing.  Symptoms have been present for one week .  No precedent sinusitis or rhinitis.  Denies fevers, recent use of public transportation or travel,  But husband was hospitalized one week ago at [facility] for surgery on shoulder and alo has symptoms. Hs been using mucinex , stopped her daily zyrtec.   Has history of moderate persistent asthma managed with Breo  And prn albuterol..  No recent exacerbations .  Outpatient Medications Prior to Visit  Medication Sig Dispense Refill  . ALBUTEROL SULFATE HFA IN Inhale 2 puffs into the lungs every 6 (six) hours as needed.    . Ascorbic Acid (VITAMIN C) 100 MG tablet Take 100 mg by mouth daily.    . Azelastine-Fluticasone (DYMISTA) 137-50 MCG/ACT SUSP Place 1 spray into both nostrils daily.    . Biotin 2500 MCG CAPS Take 1 capsule by mouth daily.    . Calcium Carbonate (CALTRATE 600 PO) Take 1 tablet by mouth daily.    . calcium citrate-vitamin D (CITRACAL+D) 315-200 MG-UNIT tablet Take by mouth.    . cholecalciferol (VITAMIN D) 1000 units tablet Take 1,000 Units by mouth daily.    Marland Kitchen EPIPEN 2-PAK 0.3 MG/0.3ML SOAJ Reported on 07/15/2015    . famotidine (PEPCID) 40 MG tablet Take 1 tablet (40 mg total) by mouth 2 (two) times daily. For 5 days 10 tablet 1  . fluticasone furoate-vilanterol (BREO ELLIPTA) 100-25 MCG/INH AEPB Inhale 1 puff into the lungs daily.    . montelukast (SINGULAIR) 10 MG tablet   10  . Multiple Vitamin (MULTIVITAMIN) capsule Take 1 capsule by mouth daily.    Marland Kitchen PRESCRIPTION MEDICATION Allergy Shots weekly    . progesterone  (PROMETRIUM) 100 MG capsule Take 1 capsule (100 mg total) by mouth daily. 90 capsule 3  . telmisartan (MICARDIS) 40 MG tablet TAKE 1 TABLET BY MOUTH DAILY 90 tablet 3  . vitamin B-12 (CYANOCOBALAMIN) 100 MCG tablet Take 100 mcg by mouth daily.     No facility-administered medications prior to visit.     Review of Systems;  Patient denies headache, fevers, malaise, unintentional weight loss, skin rash, eye pain, sinus congestion and sinus pain, sore throat, dysphagia,  hemoptysis , , dyspnea, wheezing, chest pain, palpitations, orthopnea, edema, abdominal pain, nausea, melena, diarrhea, constipation, flank pain, dysuria, hematuria, urinary  Frequency, nocturia, numbness, tingling, seizures,  Focal weakness, Loss of consciousness,  Tremor, insomnia, depression, anxiety, and suicidal ideation.      Objective:  BP 130/86 (BP Location: Left Arm, Patient Position: Sitting, Cuff Size: Normal)   Pulse 86   Temp 98.3 F (36.8 C) (Oral)   Resp 15   Ht 5' 3.25" (1.607 m)   Wt 154 lb 9.6 oz (70.1 kg)   LMP 02/15/2003   SpO2 97%   BMI 27.17 kg/m   BP 130/86 (BP Location: Left Arm, Patient Position: Sitting, Cuff Size: Normal)   Pulse 86   Temp 98.3 F (36.8 C) (Oral)   Resp 15   Ht 5' 3.25" (1.607 m)   Wt 154 lb 9.6 oz (70.1 kg)   LMP 02/15/2003   SpO2 97%   BMI  27.17 kg/m    BP Readings from Last 3 Encounters:  07/25/16 130/86  07/22/16 124/70  07/06/16 118/82    Wt Readings from Last 3 Encounters:  07/25/16 154 lb 9.6 oz (70.1 kg)  07/22/16 153 lb (69.4 kg)  07/06/16 153 lb 8 oz (69.6 kg)    General appearance: alert, cooperative and appears stated age, no distress  Ears: normal TM's and external ear canals both ears Throat: lips, mucosa, and tongue normal; teeth and gums normal Neck: no adenopathy, no carotid bruit, supple, symmetrical, trachea midline and thyroid not enlarged, symmetric, no tenderness/mass/nodules Back: symmetric, no curvature. ROM normal. No CVA  tenderness. Lungs: bilateral wheezing, good air movement. No use of accessory muscles.  Heart: regular rate and rhythm, S1, S2 normal, no murmur, click, rub or gallop Abdomen: soft, non-tender; bowel sounds normal; no masses,  no organomegaly Pulses: 2+ and symmetric Skin: Skin color, texture, turgor normal. No rashes or lesions Lymph nodes: Cervical, supraclavicular, and axillary nodes normal.  No results found for: HGBA1C  Lab Results  Component Value Date   CREATININE 0.62 06/20/2016   CREATININE 0.63 06/19/2015   CREATININE 0.66 07/04/2014    Lab Results  Component Value Date   WBC 5.5 06/20/2016   HGB 15.3 (H) 06/20/2016   HCT 44.9 06/20/2016   PLT 321.0 06/20/2016   GLUCOSE 97 06/20/2016   CHOL 188 06/20/2016   TRIG 130.0 06/20/2016   HDL 59.50 06/20/2016   LDLDIRECT 137.0 07/04/2014   LDLCALC 103 (H) 06/20/2016   ALT 16 06/20/2016   AST 18 06/20/2016   NA 142 06/20/2016   K 4.3 06/20/2016   CL 106 06/20/2016   CREATININE 0.62 06/20/2016   BUN 11 06/20/2016   CO2 29 06/20/2016   TSH 1.14 06/20/2016   MICROALBUR 0.3 03/06/2012    Mm Screening Breast Tomo Bilateral  Result Date: 07/12/2016 CLINICAL DATA:  Screening. EXAM: 2D DIGITAL SCREENING BILATERAL MAMMOGRAM WITH CAD AND ADJUNCT TOMO COMPARISON:  Previous exam(s). ACR Breast Density Category b: There are scattered areas of fibroglandular density. FINDINGS: There are no findings suspicious for malignancy. Images were processed with CAD. IMPRESSION: No mammographic evidence of malignancy. A result letter of this screening mammogram will be mailed directly to the patient. RECOMMENDATION: Screening mammogram in one year. (Code:SM-B-01Y) BI-RADS CATEGORY  1: Negative. Electronically Signed   By: Ammie Ferrier M.D.   On: 07/12/2016 16:06    Assessment & Plan:   Problem List Items Addressed This Visit    Moderate persistent asthma with exacerbation - Primary    Managed by [facility].  Repeat  PFTS done in 2018 and therapy with once daily use of Advair changed to once daily Breo.  Current exacerbation is mild.  She was given a Duoneb and 80 mg Depo Medrol in the office without complications.  Azithrmoycin, prednisone taper, and tussionex prescribed .  Prn albuterol MDI advised,  Resume zyrtec. RTC 48 hours if no improvement .  Chest x ray without infiltrates.       Relevant Medications   predniSONE (DELTASONE) 10 MG tablet   methylPREDNISolone acetate (DEPO-MEDROL) injection 80 mg (Completed)   ipratropium-albuterol (DUONEB) 0.5-2.5 (3) MG/3ML nebulizer solution 3 mL   Other Relevant Orders   DG Chest 2 View (Completed)   Colon polyps    By 2018 colonoscopy Vira Agar) .  No polyps removed.  Follow uu 5 years          I am having Ms. Postell start on chlorpheniramine-HYDROcodone, azithromycin, and predniSONE.  I am also having her maintain her Azelastine-Fluticasone, EPIPEN 2-PAK, Calcium Carbonate (CALTRATE 600 PO), multivitamin, ALBUTEROL SULFATE HFA IN, vitamin B-12, PRESCRIPTION MEDICATION, Biotin, telmisartan, famotidine, montelukast, vitamin C, calcium citrate-vitamin D, fluticasone furoate-vilanterol, cholecalciferol, and progesterone. We administered methylPREDNISolone acetate. We will continue to administer ipratropium-albuterol.  Meds ordered this encounter  Medications  . chlorpheniramine-HYDROcodone (TUSSIONEX PENNKINETIC ER) 10-8 MG/5ML SUER    Sig: Take 5 mLs by mouth at bedtime as needed for cough.    Dispense:  200 mL    Refill:  0  . azithromycin (ZITHROMAX) 250 MG tablet    Sig: 2 tablets one day 1,  One tablet daily until gone    Dispense:  6 tablet    Refill:  0  . predniSONE (DELTASONE) 10 MG tablet    Sig: 6 tablets on Day 1 , then reduce by 1 tablet daily until gone    Dispense:  21 tablet    Refill:  0  . methylPREDNISolone acetate (DEPO-MEDROL) injection 80 mg  . ipratropium-albuterol (DUONEB) 0.5-2.5 (3) MG/3ML nebulizer solution 3 mL    There  are no discontinued medications.  Follow-up: No Follow-up on file.   Crecencio Mc, MD

## 2016-07-25 NOTE — Assessment & Plan Note (Addendum)
Managed by [facility].  Repeat PFTS done in 2018 and therapy with once daily use of Advair changed to once daily Breo.  Current exacerbation is mild.  She was given a Duoneb and 80 mg Depo Medrol in the office without complications.  Azithrmoycin, prednisone taper, and tussionex prescribed .  Prn albuterol MDI advised,  Resume zyrtec. RTC 48 hours if no improvement .  Chest x ray without infiltrates.

## 2016-07-25 NOTE — Patient Instructions (Signed)
  You are having an asthma exacerbation ,  Prednisone:  60 mg   starting tomorrow.  taper by 10 mg daily until gone   Azithromycin 500 mg today,  250 mg daily thereafter until gone  Tussionex 1 tablespoon one hour before bedtime   TAKE A PROBIOTIC FOR 3 WEEKS  (see below*)  Continue to use your  Breo MDI  as directed and add your albuterol inhaler as needed   Taking an antibiotic can create an imbalance in the normal population of bacteria that live in the small intestine.  This imbalance can persist for 3 months.   Taking a probiotic ( Align, Floraque or Culturelle), the generic version of one of these over the counter medications, or an alternative form (kombucha,  Yogurt, or another dietary source) for a minimum of 3 weeks may help prevent a serious antibiotic associated diarrhea  Called clostridium dificile colitis that occurs when the bacteria population is altered .  Taking a probiotic may also prevent vaginitis due to yeast infections and can be continued indefinitely if you feel that it improves your digestion or your elimination (bowels).

## 2016-07-26 ENCOUNTER — Encounter: Payer: Self-pay | Admitting: Internal Medicine

## 2016-07-26 MED ORDER — IPRATROPIUM-ALBUTEROL 0.5-2.5 (3) MG/3ML IN SOLN: 3.0000 mL | Freq: Once | RESPIRATORY_TRACT | Status: AC

## 2016-07-28 DIAGNOSIS — J301 Allergic rhinitis due to pollen: Secondary | ICD-10-CM | POA: Diagnosis not present

## 2016-07-28 NOTE — Telephone Encounter (Signed)
Mailed unread message to patient, thanks 

## 2016-08-03 ENCOUNTER — Other Ambulatory Visit: Payer: Self-pay

## 2016-08-03 NOTE — Telephone Encounter (Signed)
You are correct about the discontinuation of the estrogen.

## 2016-08-03 NOTE — Telephone Encounter (Signed)
rx came in for vivelle dot 0.025mg . Per annual visit 6/18, pt no longer taking this. rx denied.

## 2016-08-04 DIAGNOSIS — J301 Allergic rhinitis due to pollen: Secondary | ICD-10-CM | POA: Diagnosis not present

## 2016-08-05 DIAGNOSIS — J301 Allergic rhinitis due to pollen: Secondary | ICD-10-CM | POA: Diagnosis not present

## 2016-08-11 DIAGNOSIS — J301 Allergic rhinitis due to pollen: Secondary | ICD-10-CM | POA: Diagnosis not present

## 2016-08-15 ENCOUNTER — Other Ambulatory Visit: Payer: Self-pay | Admitting: Internal Medicine

## 2016-08-18 DIAGNOSIS — J301 Allergic rhinitis due to pollen: Secondary | ICD-10-CM | POA: Diagnosis not present

## 2016-08-25 DIAGNOSIS — J301 Allergic rhinitis due to pollen: Secondary | ICD-10-CM | POA: Diagnosis not present

## 2016-09-01 DIAGNOSIS — J301 Allergic rhinitis due to pollen: Secondary | ICD-10-CM | POA: Diagnosis not present

## 2016-09-08 DIAGNOSIS — Z85828 Personal history of other malignant neoplasm of skin: Secondary | ICD-10-CM | POA: Diagnosis not present

## 2016-09-08 DIAGNOSIS — L570 Actinic keratosis: Secondary | ICD-10-CM | POA: Diagnosis not present

## 2016-09-12 DIAGNOSIS — J301 Allergic rhinitis due to pollen: Secondary | ICD-10-CM | POA: Diagnosis not present

## 2016-09-22 DIAGNOSIS — J301 Allergic rhinitis due to pollen: Secondary | ICD-10-CM | POA: Diagnosis not present

## 2016-09-29 DIAGNOSIS — J301 Allergic rhinitis due to pollen: Secondary | ICD-10-CM | POA: Diagnosis not present

## 2016-10-06 DIAGNOSIS — J301 Allergic rhinitis due to pollen: Secondary | ICD-10-CM | POA: Diagnosis not present

## 2016-10-13 DIAGNOSIS — J301 Allergic rhinitis due to pollen: Secondary | ICD-10-CM | POA: Diagnosis not present

## 2016-10-20 DIAGNOSIS — J301 Allergic rhinitis due to pollen: Secondary | ICD-10-CM | POA: Diagnosis not present

## 2016-10-27 DIAGNOSIS — J4530 Mild persistent asthma, uncomplicated: Secondary | ICD-10-CM | POA: Diagnosis not present

## 2016-10-27 DIAGNOSIS — R0602 Shortness of breath: Secondary | ICD-10-CM | POA: Diagnosis not present

## 2016-10-27 DIAGNOSIS — J3089 Other allergic rhinitis: Secondary | ICD-10-CM | POA: Diagnosis not present

## 2016-10-27 DIAGNOSIS — J329 Chronic sinusitis, unspecified: Secondary | ICD-10-CM | POA: Diagnosis not present

## 2016-11-03 DIAGNOSIS — J301 Allergic rhinitis due to pollen: Secondary | ICD-10-CM | POA: Diagnosis not present

## 2016-11-04 DIAGNOSIS — J301 Allergic rhinitis due to pollen: Secondary | ICD-10-CM | POA: Diagnosis not present

## 2016-11-08 DIAGNOSIS — M75101 Unspecified rotator cuff tear or rupture of right shoulder, not specified as traumatic: Secondary | ICD-10-CM | POA: Diagnosis not present

## 2016-11-08 DIAGNOSIS — M25511 Pain in right shoulder: Secondary | ICD-10-CM | POA: Diagnosis not present

## 2016-11-09 NOTE — Telephone Encounter (Signed)
Error

## 2016-11-17 DIAGNOSIS — J301 Allergic rhinitis due to pollen: Secondary | ICD-10-CM | POA: Diagnosis not present

## 2016-11-19 ENCOUNTER — Other Ambulatory Visit: Payer: Self-pay | Admitting: Internal Medicine

## 2016-11-24 DIAGNOSIS — J301 Allergic rhinitis due to pollen: Secondary | ICD-10-CM | POA: Diagnosis not present

## 2016-11-28 ENCOUNTER — Ambulatory Visit (INDEPENDENT_AMBULATORY_CARE_PROVIDER_SITE_OTHER): Payer: BLUE CROSS/BLUE SHIELD | Admitting: Internal Medicine

## 2016-11-28 ENCOUNTER — Encounter: Payer: Self-pay | Admitting: Internal Medicine

## 2016-11-28 ENCOUNTER — Telehealth: Payer: Self-pay | Admitting: Internal Medicine

## 2016-11-28 VITALS — BP 126/78 | HR 77 | Temp 98.1°F | Wt 152.0 lb

## 2016-11-28 DIAGNOSIS — R3915 Urgency of urination: Secondary | ICD-10-CM

## 2016-11-28 DIAGNOSIS — N3001 Acute cystitis with hematuria: Secondary | ICD-10-CM

## 2016-11-28 LAB — POC URINALSYSI DIPSTICK (AUTOMATED)
BILIRUBIN UA: NEGATIVE
Glucose, UA: NEGATIVE
KETONES UA: NEGATIVE
Nitrite, UA: NEGATIVE
PROTEIN UA: NEGATIVE
SPEC GRAV UA: 1.015 (ref 1.010–1.025)
Urobilinogen, UA: 0.2 E.U./dL
pH, UA: 7.5 (ref 5.0–8.0)

## 2016-11-28 MED ORDER — NITROFURANTOIN MONOHYD MACRO 100 MG PO CAPS: 100.0000 mg | ORAL_CAPSULE | Freq: Two times a day (BID) | ORAL | 0 refills | Status: DC

## 2016-11-28 NOTE — Patient Instructions (Signed)

## 2016-11-28 NOTE — Addendum Note (Signed)
Addended by: Lurlean Nanny on: 11/28/2016 04:46 PM   Modules accepted: Orders

## 2016-11-28 NOTE — Telephone Encounter (Signed)
Patient called complaining of UTI. She is having frequency, some blood in urine, soreness in left lower side. Patient stated she is starting to have burning with urination. Patient has been scheduled with NP at [facility] this afternoon.

## 2016-11-28 NOTE — Progress Notes (Signed)
HPI  Pt presents to the clinic today with c/o urinary frequency and blood in her urine. She reports this started yesterday. She has some cramping in the LLQ. She denies fever, chills, nausea or low back pain. She is postmenopausal and denies vaginal complaints. She has not taken anything OTC for her symptoms.   Review of Systems  Past Medical History:  Diagnosis Date  . Asthma   . Cervical disc herniation   . Facial paralysis/Bells palsy    6 week paralysis of right side of face   . Fibroid   . History of abnormal cervical Pap smear   . History of colon polyps    adenomatous  . Hypertension   . Migraine, menstrual    resolved with menopause  . Raynaud phenomenon     Family History  Problem Relation Age of Onset  . Hypertension Mother   . Stroke Mother 77       cerebral aneurysm  . Heart disease Father        CABG  . COPD Father 77  . Diabetes Father   . Heart failure Father   . Heart attack Father 42  . Celiac disease Daughter   . Breast cancer Maternal Grandmother 61       dec 89   . Diabetes Paternal Grandfather   . Heart attack Paternal Grandfather   . Heart disease Paternal Grandfather   . Heart failure Paternal Grandfather   . Heart disease Paternal Uncle     Social History   Social History  . Marital status: Married    Spouse name: N/A  . Number of children: N/A  . Years of education: N/A   Occupational History  . Not on file.   Social History Main Topics  . Smoking status: Never Smoker  . Smokeless tobacco: Never Used  . Alcohol use No  . Drug use: No  . Sexual activity: Yes    Partners: Male    Birth control/ protection: Post-menopausal   Other Topics Concern  . Not on file   Social History Narrative  . No narrative on file    Allergies  Allergen Reactions  . Shellfish Allergy Swelling     Constitutional: Denies fever, malaise, fatigue, headache or abrupt weight changes.   GU: Pt reports frequency and blood in urine. Denies urinary  urgency, dysuria,  burning sensation, odor or discharge.   No other specific complaints in a complete review of systems (except as listed in HPI above).    Objective:   Physical Exam  Pulse 77   Temp 98.1 F (36.7 C) (Oral)   Wt 152 lb (68.9 kg)   LMP 02/15/2003   SpO2 99%   BMI 26.71 kg/m  Wt Readings from Last 3 Encounters:  11/28/16 152 lb (68.9 kg)  07/25/16 154 lb 9.6 oz (70.1 kg)  07/22/16 153 lb (69.4 kg)    General: Appears her stated age, in NAD. Abdomen: Soft. Normal bowel sounds. No distention or masses noted. Tender to palpation over the bladder area. No CVA tenderness.       Assessment & Plan:    Frequency, Blood in Urine secondary to UTI:  Urinalysis: 2+ leuks, 3 + blood Will send urine culture eRx sent if for Macrobid 100 mg BID x 5 days OK to take AZO OTC Drink plenty of fluids  RTC as needed or if symptoms persist. Webb Silversmith, NP

## 2016-11-28 NOTE — Telephone Encounter (Signed)
Pt called c/o possible uti. Pt states that she has frequency, some blood in her urine along with slight soreness in her left lower side. Please advise, thank you!  Call pt @ [phone]

## 2016-11-29 ENCOUNTER — Encounter: Payer: Self-pay | Admitting: Internal Medicine

## 2016-11-30 ENCOUNTER — Telehealth: Payer: Self-pay | Admitting: Internal Medicine

## 2016-11-30 ENCOUNTER — Other Ambulatory Visit: Payer: Self-pay | Admitting: Internal Medicine

## 2016-11-30 LAB — URINE CULTURE
MICRO NUMBER: 81146687
SPECIMEN QUALITY:: ADEQUATE

## 2016-11-30 MED ORDER — FLUCONAZOLE 150 MG PO TABS: 150.0000 mg | ORAL_TABLET | Freq: Every day | ORAL | 0 refills | Status: DC

## 2016-12-01 DIAGNOSIS — J301 Allergic rhinitis due to pollen: Secondary | ICD-10-CM | POA: Diagnosis not present

## 2016-12-01 NOTE — Telephone Encounter (Signed)
Jean Luna, thank you for seeing her.  Patient called.  Informed that urine culture was negative.  She continues to report gross hematuria and urinary frequency.  The bleeding improved transiently while taking the macrobid, but she developed thrush .  Taking fluconazole now,  macrobid stopped.  Unable to come to office today .  Asked patient to place tampon to rule out vaginal source of bleeding.   No history of kidney stones. LLQ cramping has resolved.   I will make  urology referral if hematuria is confirmed

## 2016-12-02 DIAGNOSIS — C44319 Basal cell carcinoma of skin of other parts of face: Secondary | ICD-10-CM | POA: Diagnosis not present

## 2016-12-02 DIAGNOSIS — D485 Neoplasm of uncertain behavior of skin: Secondary | ICD-10-CM | POA: Diagnosis not present

## 2016-12-03 DIAGNOSIS — Z23 Encounter for immunization: Secondary | ICD-10-CM | POA: Diagnosis not present

## 2016-12-05 NOTE — Telephone Encounter (Signed)
Error

## 2016-12-08 DIAGNOSIS — J301 Allergic rhinitis due to pollen: Secondary | ICD-10-CM | POA: Diagnosis not present

## 2016-12-15 DIAGNOSIS — J301 Allergic rhinitis due to pollen: Secondary | ICD-10-CM | POA: Diagnosis not present

## 2016-12-21 ENCOUNTER — Encounter: Payer: Self-pay | Admitting: Internal Medicine

## 2016-12-21 ENCOUNTER — Ambulatory Visit: Payer: BLUE CROSS/BLUE SHIELD | Admitting: Internal Medicine

## 2016-12-21 ENCOUNTER — Ambulatory Visit (INDEPENDENT_AMBULATORY_CARE_PROVIDER_SITE_OTHER): Payer: BLUE CROSS/BLUE SHIELD

## 2016-12-21 VITALS — BP 116/76 | HR 80 | Temp 98.2°F | Resp 15 | Ht 63.25 in | Wt 150.6 lb

## 2016-12-21 DIAGNOSIS — R0609 Other forms of dyspnea: Secondary | ICD-10-CM | POA: Diagnosis not present

## 2016-12-21 DIAGNOSIS — Z8249 Family history of ischemic heart disease and other diseases of the circulatory system: Secondary | ICD-10-CM

## 2016-12-21 DIAGNOSIS — R0600 Dyspnea, unspecified: Secondary | ICD-10-CM

## 2016-12-21 DIAGNOSIS — R5383 Other fatigue: Secondary | ICD-10-CM | POA: Diagnosis not present

## 2016-12-21 DIAGNOSIS — E559 Vitamin D deficiency, unspecified: Secondary | ICD-10-CM

## 2016-12-21 DIAGNOSIS — J410 Simple chronic bronchitis: Secondary | ICD-10-CM | POA: Diagnosis not present

## 2016-12-21 DIAGNOSIS — E538 Deficiency of other specified B group vitamins: Secondary | ICD-10-CM | POA: Diagnosis not present

## 2016-12-21 DIAGNOSIS — I10 Essential (primary) hypertension: Secondary | ICD-10-CM

## 2016-12-21 LAB — TSH: TSH: 1.43 u[IU]/mL (ref 0.35–4.50)

## 2016-12-21 LAB — CBC WITH DIFFERENTIAL/PLATELET
BASOS ABS: 0 10*3/uL (ref 0.0–0.1)
Basophils Relative: 0.7 % (ref 0.0–3.0)
EOS ABS: 0.1 10*3/uL (ref 0.0–0.7)
Eosinophils Relative: 1.9 % (ref 0.0–5.0)
HEMATOCRIT: 46 % (ref 36.0–46.0)
Hemoglobin: 15.4 g/dL — ABNORMAL HIGH (ref 12.0–15.0)
LYMPHS PCT: 30.1 % (ref 12.0–46.0)
Lymphs Abs: 1.7 10*3/uL (ref 0.7–4.0)
MCHC: 33.5 g/dL (ref 30.0–36.0)
MCV: 94.3 fl (ref 78.0–100.0)
MONOS PCT: 6.7 % (ref 3.0–12.0)
Monocytes Absolute: 0.4 10*3/uL (ref 0.1–1.0)
Neutro Abs: 3.3 10*3/uL (ref 1.4–7.7)
Neutrophils Relative %: 60.6 % (ref 43.0–77.0)
PLATELETS: 326 10*3/uL (ref 150.0–400.0)
RBC: 4.87 Mil/uL (ref 3.87–5.11)
RDW: 12.5 % (ref 11.5–15.5)
WBC: 5.5 10*3/uL (ref 4.0–10.5)

## 2016-12-21 LAB — COMPREHENSIVE METABOLIC PANEL
ALBUMIN: 4.3 g/dL (ref 3.5–5.2)
ALK PHOS: 72 U/L (ref 39–117)
ALT: 17 U/L (ref 0–35)
AST: 16 U/L (ref 0–37)
BUN: 11 mg/dL (ref 6–23)
CALCIUM: 9.8 mg/dL (ref 8.4–10.5)
CHLORIDE: 105 meq/L (ref 96–112)
CO2: 30 mEq/L (ref 19–32)
Creatinine, Ser: 0.65 mg/dL (ref 0.40–1.20)
GFR: 98 mL/min (ref >60.00)
Glucose, Bld: 88 mg/dL (ref 70–99)
POTASSIUM: 4.2 meq/L (ref 3.5–5.1)
Sodium: 141 mEq/L (ref 135–145)
TOTAL PROTEIN: 6.7 g/dL (ref 6.0–8.3)
Total Bilirubin: 0.5 mg/dL (ref 0.2–1.2)

## 2016-12-21 LAB — VITAMIN D 25 HYDROXY (VIT D DEFICIENCY, FRACTURES): VITD: 56.07 ng/mL (ref 30.00–100.00)

## 2016-12-21 LAB — VITAMIN B12: VITAMIN B 12: 267 pg/mL (ref 211–911)

## 2016-12-21 LAB — BRAIN NATRIURETIC PEPTIDE: PRO B NATRI PEPTIDE: 7 pg/mL (ref 0.0–100.0)

## 2016-12-21 MED ORDER — AMLODIPINE BESYLATE 2.5 MG PO TABS: 2.5000 mg | ORAL_TABLET | Freq: Every day | ORAL | 3 refills | Status: DC

## 2016-12-21 NOTE — Progress Notes (Signed)
Subjective:  Patient ID: Jean Luna, female    DOB: [birth year]  Age: 61 y.o. MRN: [id number]  CC: The primary encounter diagnosis was Exertional dyspnea. Diagnoses of Fatigue, unspecified type, Vitamin D deficiency, Vitamin B12 deficiency without anemia, Essential hypertension, and Family history of coronary arteriosclerosis were also pertinent to this visit.  HPI [name] presents for follow up on  Fatigue, persistent asthma and elevated blood pessure  Home blood pressure has been elevated for the  past 2 weeks,  6  Readings taken,  The  Highest was 157/95 on Sunday morning . Lowest was 133/73   Has been fatigued for the past 3 weeks,  Out of breath with minimal exercise,  Saw pulmonary one month ago, on Breo and Singulair,  Felt chest pressure a week ago so reduced singulair and Breo use to less than the maintenance doses and notes less chest pressure.  Has had noninvasive cardiac work up in the last year Her Cardiac CT calcium score was  zero in 2017 .   Has been feeling slightly off  balance as soon as she steps in the shower,  Not true vertigo, but feels liek when drunk.  No falls.    Rosacea acting up with red wine.  Not drinking daily , NOT MORE THAN ONE glass per episode.   Snores   But had a negative sleep study in 2015 . Sleeps well for 6 hours  , wakes up ,  can't go back to sleep and feels tired every morning    Started getting hoarse  Yesterday .   Brushes teeth and uses mouthwash  after Breo,  No history of thrush.  No edema ,  Sleeps at a  25 degree bed chronically bc short of breath with lying flat .  ECHO normal in 2015 Has not taken motrin or aleve in 2 weeks.  Takes a baby aspirin. Daily       Outpatient Medications Prior to Visit  Medication Sig Dispense Refill  . ALBUTEROL SULFATE HFA IN Inhale 2 puffs into the lungs every 6 (six) hours as needed.    . Ascorbic Acid (VITAMIN C) 100 MG tablet Take 100 mg by mouth daily.    . Azelastine-Fluticasone  (DYMISTA) 137-50 MCG/ACT SUSP Place 1 spray into both nostrils daily.    . Biotin 2500 MCG CAPS Take 1 capsule by mouth daily.    . Calcium Carbonate (CALTRATE 600 PO) Take 1 tablet by mouth daily.    . calcium citrate-vitamin D (CITRACAL+D) 315-200 MG-UNIT tablet Take by mouth.    . cholecalciferol (VITAMIN D) 1000 units tablet Take 1,000 Units by mouth daily.    Marland Kitchen EPIPEN 2-PAK 0.3 MG/0.3ML SOAJ Reported on 07/15/2015    . estradiol (VIVELLE-DOT) 0.025 MG/24HR APPLY ONE PATCH TWICE A WEEK AS DIRECTED 24 patch 0  . famotidine (PEPCID) 40 MG tablet Take 1 tablet (40 mg total) by mouth 2 (two) times daily. For 5 days 10 tablet 1  . fluticasone furoate-vilanterol (BREO ELLIPTA) 100-25 MCG/INH AEPB Inhale 1 puff into the lungs daily.    . montelukast (SINGULAIR) 10 MG tablet   10  . Multiple Vitamin (MULTIVITAMIN) capsule Take 1 capsule by mouth daily.    Marland Kitchen PRESCRIPTION MEDICATION Allergy Shots weekly    . progesterone (PROMETRIUM) 100 MG capsule Take 1 capsule (100 mg total) by mouth daily. 90 capsule 3  . telmisartan (MICARDIS) 40 MG tablet TAKE 1 TABLET BY MOUTH DAILY 90 tablet 3  . vitamin  B-12 (CYANOCOBALAMIN) 100 MCG tablet Take 100 mcg by mouth daily.    . chlorpheniramine-HYDROcodone (TUSSIONEX PENNKINETIC ER) 10-8 MG/5ML SUER Take 5 mLs by mouth at bedtime as needed for cough. (Patient not taking: Reported on 12/21/2016) 200 mL 0  . fluconazole (DIFLUCAN) 150 MG tablet Take 1 tablet (150 mg total) by mouth daily. (Patient not taking: Reported on 12/21/2016) 2 tablet 0  . nitrofurantoin, macrocrystal-monohydrate, (MACROBID) 100 MG capsule Take 1 capsule (100 mg total) by mouth 2 (two) times daily. (Patient not taking: Reported on 12/21/2016) 10 capsule 0  . predniSONE (DELTASONE) 10 MG tablet 6 tablets on Day 1 , then reduce by 1 tablet daily until gone (Patient not taking: Reported on 12/21/2016) 21 tablet 0   Facility-Administered Medications Prior to Visit  Medication Dose Route Frequency  Provider Last Rate Last Dose  . ipratropium-albuterol (DUONEB) 0.5-2.5 (3) MG/3ML nebulizer solution 3 mL  3 mL Nebulization Once Crecencio Mc, MD        Review of Systems;  Patient denies headache, fevers, malaise, unintentional weight loss, skin rash, eye pain, sinus congestion and sinus pain, sore throat, dysphagia,  hemoptysis , cough,wheezing, chest pain, palpitations,edema, abdominal pain, nausea, melena, diarrhea, constipation, flank pain, dysuria, hematuria, urinary  Frequency, nocturia, numbness, tingling, seizures,  Focal weakness, Loss of consciousness,  Tremor, insomnia, depression, anxiety, and suicidal ideation.      Objective:  BP 116/76 (BP Location: Left Arm, Patient Position: Sitting, Cuff Size: Normal)   Pulse 80   Temp 98.2 F (36.8 C) (Oral)   Resp 15   Ht 5' 3.25" (1.607 m)   Wt 150 lb 9.6 oz (68.3 kg)   LMP 02/15/2003   SpO2 97%   BMI 26.47 kg/m   BP Readings from Last 3 Encounters:  12/21/16 116/76  11/28/16 126/78  07/25/16 130/86    Wt Readings from Last 3 Encounters:  12/21/16 150 lb 9.6 oz (68.3 kg)  11/28/16 152 lb (68.9 kg)  07/25/16 154 lb 9.6 oz (70.1 kg)    General appearance: alert, cooperative and appears stated age Ears: normal TM's and external ear canals both ears Throat: lips, mucosa, and tongue normal; teeth and gums normal Neck: no adenopathy, no carotid bruit, supple, symmetrical, trachea midline and thyroid not enlarged, symmetric, no tenderness/mass/nodules Back: symmetric, no curvature. ROM normal. No CVA tenderness. Lungs: clear to auscultation bilaterally Heart: regular rate and rhythm, S1, S2 normal, no murmur, click, rub or gallop Abdomen: soft, non-tender; bowel sounds normal; no masses,  no organomegaly Pulses: 2+ and symmetric Skin: Skin color, texture, turgor normal. No rashes or lesions Lymph nodes: Cervical, supraclavicular, and axillary nodes normal.  No results found for: HGBA1C  Lab Results  Component Value  Date   CREATININE 0.65 12/21/2016   CREATININE 0.62 06/20/2016   CREATININE 0.63 06/19/2015    Lab Results  Component Value Date   WBC 5.5 12/21/2016   HGB 15.4 (H) 12/21/2016   HCT 46.0 12/21/2016   PLT 326.0 12/21/2016   GLUCOSE 88 12/21/2016   CHOL 188 06/20/2016   TRIG 130.0 06/20/2016   HDL 59.50 06/20/2016   LDLDIRECT 137.0 07/04/2014   LDLCALC 103 (H) 06/20/2016   ALT 17 12/21/2016   AST 16 12/21/2016   NA 141 12/21/2016   K 4.2 12/21/2016   CL 105 12/21/2016   CREATININE 0.65 12/21/2016   BUN 11 12/21/2016   CO2 30 12/21/2016   TSH 1.43 12/21/2016   MICROALBUR 0.3 03/06/2012    Mm Screening Breast Tomo  Bilateral  Result Date: 07/12/2016 CLINICAL DATA:  Screening. EXAM: 2D DIGITAL SCREENING BILATERAL MAMMOGRAM WITH CAD AND ADJUNCT TOMO COMPARISON:  Previous exam(s). ACR Breast Density Category b: There are scattered areas of fibroglandular density. FINDINGS: There are no findings suspicious for malignancy. Images were processed with CAD. IMPRESSION: No mammographic evidence of malignancy. A result letter of this screening mammogram will be mailed directly to the patient. RECOMMENDATION: Screening mammogram in one year. (Code:SM-B-01Y) BI-RADS CATEGORY  1: Negative. Electronically Signed   By: Ammie Ferrier M.D.   On: 07/12/2016 16:06    Assessment & Plan:   Problem List Items Addressed This Visit    Essential hypertension    Adding amlodipine 2.5 mg daily to telmisartan       Relevant Medications   amLODipine (NORVASC) 2.5 MG tablet   Exertional dyspnea - Primary    chest x ray notes peribronchial thickening, suggesting her asthma is being undertreated due to intolerance of maintenance dose Breo.  Prednisone taper ordered,  Encouraged to resume Breo daily , also advised to use albuterol MDI prior to exercise. If no improvement return to Pulmonology .       Relevant Orders   DG Chest 2 View (Completed)   B Nat Peptide (Completed)   Family history of  coronary arteriosclerosis    Her cardiac CT scroe was zero in 2017. She has hypertension as additional CRF.  Lab Results  Component Value Date   CHOL 188 06/20/2016   HDL 59.50 06/20/2016   LDLCALC 103 (H) 06/20/2016   LDLDIRECT 137.0 07/04/2014   TRIG 130.0 06/20/2016   CHOLHDL 3 06/20/2016         Fatigue    She is not depressed.  Does not have sleep apnea per prior study,  B12 level is < 300.  Will treat.  Not anemi       Relevant Orders   Comprehensive metabolic panel (Completed)   TSH (Completed)   Vitamin B12 (Completed)   Folate RBC (Completed)   CBC with Differential/Platelet (Completed)   Vitamin B12 deficiency without anemia    Still low despite oral supplementation. Will start weekly injections      Vitamin D deficiency   Relevant Orders   VITAMIN D 25 Hydroxy (Vit-D Deficiency, Fractures) (Completed)      I have discontinued Sadye F. Berlinger's chlorpheniramine-HYDROcodone, predniSONE, nitrofurantoin (macrocrystal-monohydrate), and fluconazole. I am also having her start on amLODipine. Additionally, I am having her maintain her Azelastine-Fluticasone, EPIPEN 2-PAK, Calcium Carbonate (CALTRATE 600 PO), multivitamin, ALBUTEROL SULFATE HFA IN, PRESCRIPTION MEDICATION, Biotin, telmisartan, famotidine, montelukast, vitamin C, calcium citrate-vitamin D, fluticasone furoate-vilanterol, cholecalciferol, progesterone, and estradiol. We will continue to administer ipratropium-albuterol.  Meds ordered this encounter  Medications  . amLODipine (NORVASC) 2.5 MG tablet    Sig: Take 1 tablet (2.5 mg total) daily by mouth.    Dispense:  90 tablet    Refill:  3    Medications Discontinued During This Encounter  Medication Reason  . chlorpheniramine-HYDROcodone (TUSSIONEX PENNKINETIC ER) 10-8 MG/5ML SUER Completed Course  . fluconazole (DIFLUCAN) 150 MG tablet Completed Course  . nitrofurantoin, macrocrystal-monohydrate, (MACROBID) 100 MG capsule Completed Course  .  predniSONE (DELTASONE) 10 MG tablet Completed Course    Follow-up: No Follow-up on file.   Crecencio Mc, MD

## 2016-12-21 NOTE — Patient Instructions (Addendum)
Check pulse next episode  Text me the reading  Try 2 puffs of albuterol  before exercise to judge effect on breathing   Labs today   Amlodipine 2.5 mg once daily as needed for BP > 140/80

## 2016-12-22 ENCOUNTER — Other Ambulatory Visit: Payer: Self-pay | Admitting: Internal Medicine

## 2016-12-22 DIAGNOSIS — J301 Allergic rhinitis due to pollen: Secondary | ICD-10-CM | POA: Diagnosis not present

## 2016-12-22 LAB — FOLATE RBC: RBC Folate: 579 ng/mL RBC (ref >280)

## 2016-12-22 MED ORDER — PREDNISONE 10 MG PO TABS
ORAL_TABLET | ORAL | 0 refills | Status: DC
Start: 2016-12-22 — End: 2017-06-21

## 2016-12-22 MED ORDER — CYANOCOBALAMIN 1000 MCG/ML IJ SOLN: 1000.0000 ug | INTRAMUSCULAR | 0 refills | Status: DC

## 2016-12-23 ENCOUNTER — Other Ambulatory Visit: Payer: Self-pay | Admitting: Internal Medicine

## 2016-12-23 MED ORDER — CYANOCOBALAMIN 1000 MCG/ML IJ SOLN: 1000.0000 ug | INTRAMUSCULAR | 0 refills | Status: DC

## 2016-12-24 DIAGNOSIS — R5383 Other fatigue: Secondary | ICD-10-CM | POA: Insufficient documentation

## 2016-12-24 NOTE — Assessment & Plan Note (Signed)
Adding amlodipine 2.5 mg daily to telmisartan

## 2016-12-24 NOTE — Assessment & Plan Note (Signed)
Still low despite oral supplementation. Will start weekly injections

## 2016-12-24 NOTE — Assessment & Plan Note (Addendum)
Her cardiac CT scroe was zero in 2017. She has hypertension as additional CRF.  Lab Results  Component Value Date   CHOL 188 06/20/2016   HDL 59.50 06/20/2016   LDLCALC 103 (H) 06/20/2016   LDLDIRECT 137.0 07/04/2014   TRIG 130.0 06/20/2016   CHOLHDL 3 06/20/2016

## 2016-12-24 NOTE — Assessment & Plan Note (Signed)
chest x ray notes peribronchial thickening, suggesting her asthma is being undertreated due to intolerance of maintenance dose Breo.  Prednisone taper ordered,  Encouraged to resume Breo daily , also advised to use albuterol MDI prior to exercise. If no improvement return to Pulmonology .

## 2016-12-24 NOTE — Assessment & Plan Note (Signed)
She is not depressed.  Does not have sleep apnea per prior study,  B12 level is < 300.  Will treat.  Not anemi

## 2016-12-29 DIAGNOSIS — J301 Allergic rhinitis due to pollen: Secondary | ICD-10-CM | POA: Diagnosis not present

## 2017-01-04 ENCOUNTER — Other Ambulatory Visit: Payer: Self-pay | Admitting: Internal Medicine

## 2017-01-04 DIAGNOSIS — J301 Allergic rhinitis due to pollen: Secondary | ICD-10-CM | POA: Diagnosis not present

## 2017-01-10 DIAGNOSIS — J209 Acute bronchitis, unspecified: Secondary | ICD-10-CM | POA: Diagnosis not present

## 2017-01-10 DIAGNOSIS — R05 Cough: Secondary | ICD-10-CM | POA: Diagnosis not present

## 2017-01-12 DIAGNOSIS — J301 Allergic rhinitis due to pollen: Secondary | ICD-10-CM | POA: Diagnosis not present

## 2017-01-19 DIAGNOSIS — C44319 Basal cell carcinoma of skin of other parts of face: Secondary | ICD-10-CM | POA: Diagnosis not present

## 2017-01-19 DIAGNOSIS — J301 Allergic rhinitis due to pollen: Secondary | ICD-10-CM | POA: Diagnosis not present

## 2017-01-19 DIAGNOSIS — L905 Scar conditions and fibrosis of skin: Secondary | ICD-10-CM | POA: Diagnosis not present

## 2017-01-26 DIAGNOSIS — J301 Allergic rhinitis due to pollen: Secondary | ICD-10-CM | POA: Diagnosis not present

## 2017-01-27 DIAGNOSIS — J301 Allergic rhinitis due to pollen: Secondary | ICD-10-CM | POA: Diagnosis not present

## 2017-02-02 DIAGNOSIS — J301 Allergic rhinitis due to pollen: Secondary | ICD-10-CM | POA: Diagnosis not present

## 2017-02-09 DIAGNOSIS — J301 Allergic rhinitis due to pollen: Secondary | ICD-10-CM | POA: Diagnosis not present

## 2017-02-14 DIAGNOSIS — M8580 Other specified disorders of bone density and structure, unspecified site: Secondary | ICD-10-CM

## 2017-02-14 HISTORY — DX: Other specified disorders of bone density and structure, unspecified site: M85.80

## 2017-02-16 DIAGNOSIS — J301 Allergic rhinitis due to pollen: Secondary | ICD-10-CM | POA: Diagnosis not present

## 2017-02-20 ENCOUNTER — Encounter: Payer: Self-pay | Admitting: Internal Medicine

## 2017-02-21 ENCOUNTER — Other Ambulatory Visit: Payer: Self-pay | Admitting: Internal Medicine

## 2017-02-21 ENCOUNTER — Other Ambulatory Visit (INDEPENDENT_AMBULATORY_CARE_PROVIDER_SITE_OTHER): Payer: BLUE CROSS/BLUE SHIELD

## 2017-02-21 DIAGNOSIS — R310 Gross hematuria: Secondary | ICD-10-CM

## 2017-02-21 DIAGNOSIS — N3001 Acute cystitis with hematuria: Secondary | ICD-10-CM

## 2017-02-21 DIAGNOSIS — N390 Urinary tract infection, site not specified: Secondary | ICD-10-CM | POA: Insufficient documentation

## 2017-02-21 MED ORDER — SULFAMETHOXAZOLE-TRIMETHOPRIM 800-160 MG PO TABS: 1.0000 | ORAL_TABLET | Freq: Two times a day (BID) | ORAL | 0 refills | Status: DC

## 2017-02-21 NOTE — Telephone Encounter (Signed)
Still having pressure in bladder no bleeding, feels like in the past when she has had a UTI, started on Sunday night at about 6:30 urinating more then normal. Ok to have come in for UA, or can I ad at 430 today.

## 2017-02-22 ENCOUNTER — Encounter: Payer: Self-pay | Admitting: Internal Medicine

## 2017-02-22 LAB — URINALYSIS, ROUTINE W REFLEX MICROSCOPIC
Bilirubin Urine: NEGATIVE
Nitrite: NEGATIVE
PH: 7.5 (ref 5.0–8.0)
SPECIFIC GRAVITY, URINE: 1.015 (ref 1.000–1.030)
Total Protein, Urine: NEGATIVE
Urine Glucose: NEGATIVE
Urobilinogen, UA: 0.2 (ref 0.0–1.0)

## 2017-02-22 LAB — URINE CULTURE
MICRO NUMBER: 90029095
SPECIMEN QUALITY: ADEQUATE

## 2017-02-23 DIAGNOSIS — J301 Allergic rhinitis due to pollen: Secondary | ICD-10-CM | POA: Diagnosis not present

## 2017-03-02 DIAGNOSIS — J301 Allergic rhinitis due to pollen: Secondary | ICD-10-CM | POA: Diagnosis not present

## 2017-03-05 ENCOUNTER — Other Ambulatory Visit: Payer: Self-pay | Admitting: Internal Medicine

## 2017-03-09 DIAGNOSIS — J301 Allergic rhinitis due to pollen: Secondary | ICD-10-CM | POA: Diagnosis not present

## 2017-03-16 DIAGNOSIS — J301 Allergic rhinitis due to pollen: Secondary | ICD-10-CM | POA: Diagnosis not present

## 2017-03-30 DIAGNOSIS — J301 Allergic rhinitis due to pollen: Secondary | ICD-10-CM | POA: Diagnosis not present

## 2017-04-06 DIAGNOSIS — J301 Allergic rhinitis due to pollen: Secondary | ICD-10-CM | POA: Diagnosis not present

## 2017-04-13 DIAGNOSIS — J301 Allergic rhinitis due to pollen: Secondary | ICD-10-CM | POA: Diagnosis not present

## 2017-04-20 DIAGNOSIS — J301 Allergic rhinitis due to pollen: Secondary | ICD-10-CM | POA: Diagnosis not present

## 2017-04-21 DIAGNOSIS — J301 Allergic rhinitis due to pollen: Secondary | ICD-10-CM | POA: Diagnosis not present

## 2017-04-27 DIAGNOSIS — J301 Allergic rhinitis due to pollen: Secondary | ICD-10-CM | POA: Diagnosis not present

## 2017-04-27 DIAGNOSIS — J4540 Moderate persistent asthma, uncomplicated: Secondary | ICD-10-CM | POA: Diagnosis not present

## 2017-04-27 DIAGNOSIS — J329 Chronic sinusitis, unspecified: Secondary | ICD-10-CM | POA: Diagnosis not present

## 2017-04-27 DIAGNOSIS — J309 Allergic rhinitis, unspecified: Secondary | ICD-10-CM | POA: Diagnosis not present

## 2017-04-28 DIAGNOSIS — D485 Neoplasm of uncertain behavior of skin: Secondary | ICD-10-CM | POA: Diagnosis not present

## 2017-04-28 DIAGNOSIS — L820 Inflamed seborrheic keratosis: Secondary | ICD-10-CM | POA: Diagnosis not present

## 2017-04-28 DIAGNOSIS — D2239 Melanocytic nevi of other parts of face: Secondary | ICD-10-CM | POA: Diagnosis not present

## 2017-05-11 DIAGNOSIS — J301 Allergic rhinitis due to pollen: Secondary | ICD-10-CM | POA: Diagnosis not present

## 2017-05-18 DIAGNOSIS — J301 Allergic rhinitis due to pollen: Secondary | ICD-10-CM | POA: Diagnosis not present

## 2017-05-25 DIAGNOSIS — J301 Allergic rhinitis due to pollen: Secondary | ICD-10-CM | POA: Diagnosis not present

## 2017-06-01 DIAGNOSIS — J301 Allergic rhinitis due to pollen: Secondary | ICD-10-CM | POA: Diagnosis not present

## 2017-06-08 DIAGNOSIS — M25562 Pain in left knee: Secondary | ICD-10-CM | POA: Diagnosis not present

## 2017-06-08 DIAGNOSIS — M7602 Gluteal tendinitis, left hip: Secondary | ICD-10-CM | POA: Diagnosis not present

## 2017-06-08 DIAGNOSIS — M75101 Unspecified rotator cuff tear or rupture of right shoulder, not specified as traumatic: Secondary | ICD-10-CM | POA: Diagnosis not present

## 2017-06-12 ENCOUNTER — Telehealth: Payer: Self-pay | Admitting: Internal Medicine

## 2017-06-12 DIAGNOSIS — R5383 Other fatigue: Secondary | ICD-10-CM

## 2017-06-12 DIAGNOSIS — I10 Essential (primary) hypertension: Secondary | ICD-10-CM

## 2017-06-12 DIAGNOSIS — E559 Vitamin D deficiency, unspecified: Secondary | ICD-10-CM

## 2017-06-12 DIAGNOSIS — E785 Hyperlipidemia, unspecified: Secondary | ICD-10-CM

## 2017-06-12 NOTE — Telephone Encounter (Signed)
Spoke with pt and scheduled her a fasting lab appt. Pt is aware of appt date and time.

## 2017-06-12 NOTE — Telephone Encounter (Signed)
Fasting labs ordered.  Patient's CPE is may 8,  Please offer her a lab visit prior to appt if not an early am app t

## 2017-06-15 ENCOUNTER — Other Ambulatory Visit (INDEPENDENT_AMBULATORY_CARE_PROVIDER_SITE_OTHER): Payer: BLUE CROSS/BLUE SHIELD

## 2017-06-15 DIAGNOSIS — E785 Hyperlipidemia, unspecified: Secondary | ICD-10-CM

## 2017-06-15 DIAGNOSIS — E559 Vitamin D deficiency, unspecified: Secondary | ICD-10-CM | POA: Diagnosis not present

## 2017-06-15 DIAGNOSIS — I10 Essential (primary) hypertension: Secondary | ICD-10-CM

## 2017-06-15 DIAGNOSIS — R5383 Other fatigue: Secondary | ICD-10-CM

## 2017-06-15 DIAGNOSIS — J301 Allergic rhinitis due to pollen: Secondary | ICD-10-CM | POA: Diagnosis not present

## 2017-06-15 LAB — COMPREHENSIVE METABOLIC PANEL
ALBUMIN: 4.5 g/dL (ref 3.5–5.2)
ALK PHOS: 73 U/L (ref 39–117)
ALT: 15 U/L (ref 0–35)
AST: 13 U/L (ref 0–37)
BUN: 15 mg/dL (ref 6–23)
CALCIUM: 9.6 mg/dL (ref 8.4–10.5)
CO2: 28 mEq/L (ref 19–32)
Chloride: 102 mEq/L (ref 96–112)
Creatinine, Ser: 0.67 mg/dL (ref 0.40–1.20)
GFR: 94.48 mL/min (ref >60.00)
Glucose, Bld: 97 mg/dL (ref 70–99)
POTASSIUM: 4 meq/L (ref 3.5–5.1)
Sodium: 139 mEq/L (ref 135–145)
TOTAL PROTEIN: 7.1 g/dL (ref 6.0–8.3)
Total Bilirubin: 0.8 mg/dL (ref 0.2–1.2)

## 2017-06-15 LAB — CBC WITH DIFFERENTIAL/PLATELET
Basophils Absolute: 0 10*3/uL (ref 0.0–0.1)
Basophils Relative: 0.2 % (ref 0.0–3.0)
EOS ABS: 0 10*3/uL (ref 0.0–0.7)
Eosinophils Relative: 0.2 % (ref 0.0–5.0)
HCT: 46.1 % — ABNORMAL HIGH (ref 36.0–46.0)
HEMOGLOBIN: 15.9 g/dL — AB (ref 12.0–15.0)
Lymphocytes Relative: 21.6 % (ref 12.0–46.0)
Lymphs Abs: 2.1 10*3/uL (ref 0.7–4.0)
MCHC: 34.4 g/dL (ref 30.0–36.0)
MCV: 91 fl (ref 78.0–100.0)
MONO ABS: 0.6 10*3/uL (ref 0.1–1.0)
Monocytes Relative: 6.1 % (ref 3.0–12.0)
NEUTROS PCT: 71.9 % (ref 43.0–77.0)
Neutro Abs: 7.1 10*3/uL (ref 1.4–7.7)
PLATELETS: 416 10*3/uL — AB (ref 150.0–400.0)
RBC: 5.07 Mil/uL (ref 3.87–5.11)
RDW: 12.8 % (ref 11.5–15.5)
WBC: 9.9 10*3/uL (ref 4.0–10.5)

## 2017-06-15 LAB — LIPID PANEL
CHOLESTEROL: 177 mg/dL (ref 0–200)
HDL: 63.9 mg/dL (ref >39.00)
LDL Cholesterol: 91 mg/dL (ref 0–99)
NonHDL: 113.26
TRIGLYCERIDES: 109 mg/dL (ref 0.0–149.0)
Total CHOL/HDL Ratio: 3
VLDL: 21.8 mg/dL (ref 0.0–40.0)

## 2017-06-15 LAB — VITAMIN D 25 HYDROXY (VIT D DEFICIENCY, FRACTURES): VITD: 42.08 ng/mL (ref 30.00–100.00)

## 2017-06-15 LAB — TSH: TSH: 1.12 u[IU]/mL (ref 0.35–4.50)

## 2017-06-19 ENCOUNTER — Other Ambulatory Visit: Payer: Self-pay | Admitting: Internal Medicine

## 2017-06-19 DIAGNOSIS — Z1231 Encounter for screening mammogram for malignant neoplasm of breast: Secondary | ICD-10-CM

## 2017-06-21 ENCOUNTER — Other Ambulatory Visit (HOSPITAL_COMMUNITY)
Admission: RE | Admit: 2017-06-21 | Discharge: 2017-06-21 | Disposition: A | Discharge status: Home / Self Care | Payer: BLUE CROSS/BLUE SHIELD | Source: Ambulatory Visit | Attending: Internal Medicine | Admitting: Internal Medicine

## 2017-06-21 ENCOUNTER — Encounter: Payer: Self-pay | Admitting: Internal Medicine

## 2017-06-21 ENCOUNTER — Ambulatory Visit (INDEPENDENT_AMBULATORY_CARE_PROVIDER_SITE_OTHER): Payer: BLUE CROSS/BLUE SHIELD | Admitting: Internal Medicine

## 2017-06-21 VITALS — BP 130/78 | HR 75 | Temp 98.4°F | Resp 14 | Ht 63.25 in | Wt 147.4 lb

## 2017-06-21 DIAGNOSIS — D473 Essential (hemorrhagic) thrombocythemia: Secondary | ICD-10-CM | POA: Diagnosis not present

## 2017-06-21 DIAGNOSIS — M542 Cervicalgia: Secondary | ICD-10-CM | POA: Diagnosis not present

## 2017-06-21 DIAGNOSIS — Z124 Encounter for screening for malignant neoplasm of cervix: Secondary | ICD-10-CM | POA: Diagnosis not present

## 2017-06-21 DIAGNOSIS — Z0000 Encounter for general adult medical examination without abnormal findings: Secondary | ICD-10-CM

## 2017-06-21 DIAGNOSIS — E538 Deficiency of other specified B group vitamins: Secondary | ICD-10-CM

## 2017-06-21 DIAGNOSIS — R5383 Other fatigue: Secondary | ICD-10-CM

## 2017-06-21 DIAGNOSIS — Z1151 Encounter for screening for human papillomavirus (HPV): Secondary | ICD-10-CM | POA: Insufficient documentation

## 2017-06-21 DIAGNOSIS — D75839 Thrombocytosis, unspecified: Secondary | ICD-10-CM

## 2017-06-21 DIAGNOSIS — I10 Essential (primary) hypertension: Secondary | ICD-10-CM

## 2017-06-21 DIAGNOSIS — D751 Secondary polycythemia: Secondary | ICD-10-CM | POA: Insufficient documentation

## 2017-06-21 MED ORDER — AMLODIPINE BESYLATE 2.5 MG PO TABS: 2.5000 mg | ORAL_TABLET | Freq: Every day | ORAL | 3 refills | Status: DC

## 2017-06-21 MED ORDER — ALPRAZOLAM 0.25 MG PO TABS: 0.2500 mg | ORAL_TABLET | Freq: Every evening | ORAL | 0 refills | Status: DC | PRN

## 2017-06-21 NOTE — Assessment & Plan Note (Addendum)
Annual comprehensive preventive exam was done as well as an evaluation and management of chronic conditions .  During the course of the visit the patient was educated and counseled about appropriate screening and preventinve services including :  diabetes screening, lipid analysis with projected  10 year  risk for CAD , nutrition counseling, breast, cervical and colorectal cancer screening, and recommended immunizations.  Printed recommendations for health maintenance screenings was given.  PAP smear done

## 2017-06-21 NOTE — Assessment & Plan Note (Signed)
S/p ACDF of C5-6 by Thamas Jaegers at [facility].  Now with persistentt burning pain  For several monthss,    suggesting a C3 radiculopathy  affecting the neck and trapezius.  MRI and referral ordered

## 2017-06-21 NOTE — Patient Instructions (Signed)
TRIAL OF ALPRAZOLAM FOR INSOMNIA ISSUES.  YOU MAY TAKE IT AT BEDTIME OR AT 2 AM FOR WAKEUPS  MRI CERVICAL SPINE AND REFERRAL TO [facility] IN PROCESS  REFERRAL TO [facility] FOR ELEVATED HGB AND PLATELETS

## 2017-06-21 NOTE — Progress Notes (Signed)
Patient ID: Jean Luna, female    DOB: 1955-05-28  Age: 62 y.o. MRN: [id number]  The patient is here for annual preventive  examination and management of other chronic and acute problems.  Last colonoscopy Feb 2018by  Jean Luna  ELEVATED HGB 15.3  HISTORICALLY FOR THE PAST 2 YEARS  NOW 15.9 , PLATELETS ALSO ELEVATED AT 416    C3 RADICULOPATHY CONSTANT BURNING OF NECK AND  trapeziusSHOULDER  On the right. PRIOR decompressive surgery done at [facility] (ACDF of C5-6 DISCUSSED GETTING MRI CERVICAL SPINE AT [facility] FOLLOWED BY  APP WITH Jean Luna     The risk factors are reflected in the social history.  The roster of all physicians providing medical care to patient - is listed in the Snapshot section of the chart.  Activities of daily living:  The patient is 100% independent in all ADLs: dressing, toileting, feeding as well as independent mobility  Home safety : The patient has smoke detectors in the home. They wear seatbelts.  There are no firearms at home. There is no violence in the home.   There is no risks for hepatitis, STDs or HIV. There is no   history of blood transfusion. They have no travel history to infectious disease endemic areas of the world.  The patient has seen their dentist in the last six month. They have seen their eye doctor in the last year. They admit to slight hearing difficulty with regard to whispered voices and some television programs.  They have deferred audiologic testing in the last year.  They do not  have excessive sun exposure. Discussed the need for sun protection: hats, long sleeves and use of sunscreen if there is significant sun exposure.   Diet: the importance of a healthy diet is discussed. They do have a healthy diet.  The benefits of regular aerobic exercise were discussed. She walks 4 times per week ,  20 minutes.   Depression screen: there are no signs or vegative symptoms of depression- irritability, change in appetite, anhedonia,  sadness/tearfullness.  Cognitive assessment: the patient manages all their financial and personal affairs and is actively engaged. They could relate day,date,year and events; recalled 2/3 objects at 3 minutes; performed clock-face test normally.  The following portions of the patient's history were reviewed and updated as appropriate: allergies, current medications, past family history, past medical history,  past surgical history, past social history  and problem list.  Visual acuity was not assessed per patient preference since she has regular follow up with her ophthalmologist. Hearing and body mass index were assessed and reviewed.   During the course of the visit the patient was educated and counseled about appropriate screening and preventive services including : fall prevention , diabetes screening, nutrition counseling, colorectal cancer screening, and recommended immunizations.    CC: The primary encounter diagnosis was Cervical cancer screening. Diagnoses of Encounter for preventive health examination, Vitamin B12 deficiency without anemia, Essential hypertension, Cervicalgia, Erythrocytosis, Thrombocytosis ([address]), and Fatigue, unspecified type were also pertinent to this visit.  1) fatigue.  Persistent,  With daytime somnolence if she sits quitly . Denies chest pain shortness of breath except with mild exertion.  Snores,  bt negative sleep study in 2015.   History Jean Luna has a past medical history of Asthma, Cervical disc herniation, Facial paralysis/Bells palsy, Fibroid, History of abnormal cervical Pap smear, History of colon polyps, Hypertension, Migraine, menstrual, and Raynaud phenomenon.   She has a past surgical history that includes Nasal sinus surgery; Cervical cone  biopsy (1985); Cervical biopsy w/ loop electrode excision (1991); Cervical disc surgery (11/2012); Colposcopy (1984); and Finger surgery (Left, 06/2014).   Her family history includes Breast cancer (age of onset: 24)  in her maternal grandmother; COPD (age of onset: 73) in her father; Celiac disease in her daughter; Diabetes in her father and paternal grandfather; Heart attack in her paternal grandfather; Heart attack (age of onset: 72) in her father; Heart disease in her father, paternal grandfather, and paternal uncle; Heart failure in her father and paternal grandfather; Hypertension in her mother; Stroke (age of onset: 25) in her mother.She reports that she has never smoked. She has never used smokeless tobacco. She reports that she does not drink alcohol or use drugs.  Outpatient Medications Prior to Visit  Medication Sig Dispense Refill  . ALBUTEROL SULFATE HFA IN Inhale 2 puffs into the lungs every 6 (six) hours as needed.    . Ascorbic Acid (VITAMIN C) 100 MG tablet Take 100 mg by mouth daily.    . Azelastine-Fluticasone (DYMISTA) 137-50 MCG/ACT SUSP Place 1 spray into both nostrils daily.    . Biotin 2500 MCG CAPS Take 1 capsule by mouth daily.    . budesonide (PULMICORT) 0.5 MG/2ML nebulizer solution   10  . Calcium Carbonate (CALTRATE 600 PO) Take 1 tablet by mouth daily.    . calcium citrate-vitamin D (CITRACAL+D) 315-200 MG-UNIT tablet Take by mouth.    . cholecalciferol (VITAMIN D) 1000 units tablet Take 1,000 Units by mouth daily.    Marland Kitchen EPIPEN 2-PAK 0.3 MG/0.3ML SOAJ Reported on 07/15/2015    . estradiol (VIVELLE-DOT) 0.025 MG/24HR APPLY ONE PATCH TWICE A WEEK AS DIRECTED 24 patch 0  . famotidine (PEPCID) 40 MG tablet Take 1 tablet (40 mg total) by mouth 2 (two) times daily. For 5 days 10 tablet 1  . fluticasone furoate-vilanterol (BREO ELLIPTA) 100-25 MCG/INH AEPB Inhale 1 puff into the lungs daily.    . montelukast (SINGULAIR) 10 MG tablet   10  . Multiple Vitamin (MULTIVITAMIN) capsule Take 1 capsule by mouth daily.    Marland Kitchen PRESCRIPTION MEDICATION Allergy Shots weekly    . progesterone (PROMETRIUM) 100 MG capsule Take 1 capsule (100 mg total) by mouth daily. 90 capsule 3  . telmisartan (MICARDIS)  40 MG tablet TAKE 1 TABLET BY MOUTH DAILY 90 tablet 3  . amLODipine (NORVASC) 2.5 MG tablet Take 1 tablet (2.5 mg total) daily by mouth. (Patient not taking: Reported on 06/21/2017) 90 tablet 3  . predniSONE (DELTASONE) 10 MG tablet 6 tablets on Day 1 , then reduce by 1 tablet daily until gone (Patient not taking: Reported on 06/21/2017) 21 tablet 0  . sulfamethoxazole-trimethoprim (BACTRIM DS,SEPTRA DS) 800-160 MG tablet Take 1 tablet by mouth 2 (two) times daily. (Patient not taking: Reported on 06/21/2017) 10 tablet 0   Facility-Administered Medications Prior to Visit  Medication Dose Route Frequency Provider Last Rate Last Dose  . ipratropium-albuterol (DUONEB) 0.5-2.5 (3) MG/3ML nebulizer solution 3 mL  3 mL Nebulization Once Crecencio Mc, MD        Review of Systems   Patient denies headache, fevers, malaise, unintentional weight loss, skin rash, eye pain, sinus congestion and sinus pain, sore throat, dysphagia,  hemoptysis , cough, dyspnea, wheezing, chest pain, palpitations, orthopnea, edema, abdominal pain, nausea, melena, diarrhea, constipation, flank pain, dysuria, hematuria, urinary  Frequency, nocturia, numbness, tingling, seizures,  Focal weakness, Loss of consciousness,  Tremor, insomnia, depression, anxiety, and suicidal ideation.      Objective:  BP 130/78 (BP Location: Left Arm, Patient Position: Sitting, Cuff Size: Normal)   Pulse 75   Temp 98.4 F (36.9 C) (Oral)   Resp 14   Ht 5' 3.25" (1.607 m)   Wt 147 lb 6.4 oz (66.9 kg)   LMP 02/15/2003   SpO2 98%   BMI 25.90 kg/m   Physical Exam   . General Appearance:    Alert, cooperative, no distress, appears stated age  Head:    Normocephalic, without obvious abnormality, atraumatic  Eyes:    PERRL, conjunctiva/corneas clear, EOM's intact, fundi    benign, both eyes  Ears:    Normal TM's and external ear canals, both ears  Nose:   Nares normal, septum midline, mucosa normal, no drainage    or sinus tenderness  Throat:    Lips, mucosa, and tongue normal; teeth and gums normal  Neck:   Supple, symmetrical, trachea midline, no adenopathy;    thyroid:  no enlargement/tenderness/nodules; no carotid   bruit or JVD  Back:     Symmetric, no curvature, ROM normal, no CVA tenderness  Lungs:     Clear to auscultation bilaterally, respirations unlabored  Chest Wall:    No tenderness or deformity   Heart:    Regular rate and rhythm, S1 and S2 normal, no murmur, rub   or gallop  Breast Exam:    No tenderness, masses, or nipple abnormality  Abdomen:     Soft, non-tender, bowel sounds active all four quadrants,    no masses, no organomegaly  Genitalia:    Pelvic: cervix normal in appearance, external genitalia normal, no adnexal masses or tenderness, no cervical motion tenderness, rectovaginal septum normal, uterus normal size, shape, and consistency and vagina normal without discharge  Extremities:   Extremities normal, atraumatic, no cyanosis or edema  Pulses:   2+ and symmetric all extremities  Skin:   Skin color, texture, turgor normal, no rashes or lesions  Lymph nodes:   Cervical, supraclavicular, and axillary nodes normal  Neurologic:   CNII-XII intact, normal strength, sensation and reflexes    throughout      Assessment & Plan:   Problem List Items Addressed This Visit    Vitamin B12 deficiency without anemia    Managed with monthly IM injections  Since Nov 2018  Lab Results  Component Value Date   VITAMINB12 267 12/21/2016         Fatigue    No improvement despite B12 supplementation.  Advised to consider repeating sleep study        Essential hypertension    Recent significant elevations have resolved spontaneously. . Home machine is falsely elevating diastolic by 10.  No changes today pending repeat evaluaiton of home readings.       Relevant Medications   amLODipine (NORVASC) 2.5 MG tablet   Erythrocytosis    Minimally present sine 2016 .  Now 15.9 with thrombocytosis.Heme referral to  rule out PCV, HH      Encounter for preventive health examination    Annual comprehensive preventive exam was done as well as an evaluation and management of chronic conditions .  During the course of the visit the patient was educated and counseled about appropriate screening and preventinve services including :  diabetes screening, lipid analysis with projected  10 year  risk for CAD , nutrition counseling, breast, cervical and colorectal cancer screening, and recommended immunizations.  Printed recommendations for health maintenance screenings was given.  PAP smear done  Cervicalgia    S/p ACDF of C5-6 by [facility]egers at [facility].  Now with persistentt burning pain  For several monthss,    suggesting a C3 radiculopathy  affecting the neck and trapezius.  MRI and referral ordered       Relevant Orders   MR Cervical Spine Wo Contrast   Ambulatory referral to Orthopedic Surgery    Other Visit Diagnoses    Cervical cancer screening    -  Primary   Relevant Orders   Cytology - PAP   Thrombocytosis ([place])          I have discontinued Gracee F. Pasion's predniSONE and sulfamethoxazole-trimethoprim. I have also changed her amLODipine. Additionally, I am having her start on ALPRAZolam. Lastly, I am having her maintain her Azelastine-Fluticasone, EPIPEN 2-PAK, Calcium Carbonate (CALTRATE 600 PO), multivitamin, ALBUTEROL SULFATE HFA IN, PRESCRIPTION MEDICATION, Biotin, famotidine, montelukast, vitamin C, calcium citrate-vitamin D, fluticasone furoate-vilanterol, cholecalciferol, progesterone, telmisartan, estradiol, and budesonide. We will continue to administer ipratropium-albuterol.  Meds ordered this encounter  Medications  . amLODipine (NORVASC) 2.5 MG tablet    Sig: Take 1 tablet (2.5 mg total) by mouth daily.    Dispense:  90 tablet    Refill:  3  . ALPRAZolam (XANAX) 0.25 MG tablet    Sig: Take 1 tablet (0.25 mg total) by mouth at bedtime as needed for anxiety.     Dispense:  30 tablet    Refill:  0    Medications Discontinued During This Encounter  Medication Reason  . predniSONE (DELTASONE) 10 MG tablet Completed Course  . sulfamethoxazole-trimethoprim (BACTRIM DS,SEPTRA DS) 800-160 MG tablet Completed Course  . amLODipine (NORVASC) 2.5 MG tablet Reorder    Follow-up: Return in about 6 months (around 12/22/2017).   Crecencio Mc, MD

## 2017-06-21 NOTE — Assessment & Plan Note (Signed)
Minimally present sine 2016 .  Now 15.9 with thrombocytosis.Heme referral to rule out PCV, HH

## 2017-06-21 NOTE — Assessment & Plan Note (Addendum)
Recent significant elevations have resolved spontaneously. . Home machine is falsely elevating diastolic by 10.  No changes today pending repeat evaluaiton of home readings.

## 2017-06-21 NOTE — Assessment & Plan Note (Signed)
No improvement despite B12 supplementation.  Advised to consider repeating sleep study

## 2017-06-21 NOTE — Assessment & Plan Note (Signed)
Managed with monthly IM injections  Since Nov 2018  Lab Results  Component Value Date   VZCHYIFO27 741 12/21/2016

## 2017-06-23 LAB — CYTOLOGY - PAP
Diagnosis: NEGATIVE
HPV (WINDOPATH): NOT DETECTED

## 2017-06-25 DIAGNOSIS — D473 Essential (hemorrhagic) thrombocythemia: Secondary | ICD-10-CM | POA: Insufficient documentation

## 2017-06-25 DIAGNOSIS — D75839 Thrombocytosis, unspecified: Secondary | ICD-10-CM | POA: Insufficient documentation

## 2017-06-25 NOTE — Progress Notes (Signed)
[place]  [phone] [phone]  ID: SYRAI GLADWIN OB: 06-18-1955  MR#: [id number]  FOY#:[id number]  Patient Care Team: Crecencio Mc, MD as PCP - General (Internal Medicine) Minna Merritts, MD as Consulting Physician (Cardiology)  CHIEF COMPLAINT: Erythrocytosis and thrombocytosis.  INTERVAL HISTORY: Patient is a 62 year old female who was noted to have an increased red blood cell as well as platelet count on routine yearly blood work.  She admits to increased fatigue, but otherwise feels well.  She has no neurologic complaints.  She denies any recent fevers or illnesses.  She has a good appetite and denies weight loss.  She has no chest pain or shortness of breath.  She denies any nausea, vomiting, constipation, or diarrhea.  She has no urinary complaints.  Patient feels at her baseline offers no specific complaints today.  REVIEW OF SYSTEMS:   Review of Systems  Constitutional: Positive for malaise/fatigue. Negative for fever and weight loss.  Respiratory: Negative.  Negative for cough and shortness of breath.   Cardiovascular: Negative.  Negative for chest pain and leg swelling.  Gastrointestinal: Negative.  Negative for abdominal pain.  Genitourinary: Negative.  Negative for dysuria.  Musculoskeletal: Negative.  Negative for myalgias.  Skin: Negative.  Negative for rash.  Neurological: Negative.  Negative for sensory change, focal weakness and weakness.  Psychiatric/Behavioral: Negative.  The patient is not nervous/anxious.     As per HPI. Otherwise, a complete review of systems is negative.  PAST MEDICAL HISTORY: Past Medical History:  Diagnosis Date  . Asthma   . Cervical disc herniation   . Facial paralysis/Bells palsy    6 week paralysis of right side of face   . Fibroid   . History of abnormal cervical Pap smear   . History of colon polyps    adenomatous  . Hypertension   . Migraine, menstrual    resolved with  menopause  . Raynaud phenomenon     PAST SURGICAL HISTORY: Past Surgical History:  Procedure Laterality Date  . CERVICAL BIOPSY  W/ LOOP ELECTRODE EXCISION  1991  . CERVICAL CONE BIOPSY  1985  . CERVICAL DISC SURGERY  11/2012   --removed bone spurs, bulging disc  . COLPOSCOPY  1984   no treatment  . FINGER SURGERY Left 06/2014   Left index finger.   Marland Kitchen NASAL SINUS SURGERY      FAMILY HISTORY: Family History  Problem Relation Age of Onset  . Hypertension Mother   . Stroke Mother 38       cerebral aneurysm  . Heart disease Father        CABG  . COPD Father 45  . Diabetes Father   . Heart failure Father   . Heart attack Father 87  . Breast cancer Maternal Grandmother 61       dec 89   . Diabetes Paternal Grandfather   . Heart attack Paternal Grandfather   . Heart disease Paternal Grandfather   . Heart failure Paternal Grandfather   . Heart disease Paternal Uncle   . Stroke Paternal Uncle     ADVANCED DIRECTIVES (Y/N):  N  HEALTH MAINTENANCE: Social History   Tobacco Use  . Smoking status: Never Smoker  . Smokeless tobacco: Never Used  Substance Use Topics  . Alcohol use: No  . Drug use: No     Colonoscopy:  PAP:  Bone density:  Lipid panel:  Allergies  Allergen Reactions  . Shellfish Allergy Swelling  . Other  Other reaction(s): Other (See Comments) Dust, certain trees, molds, grasses Trout-from allergy test    Current Outpatient Medications  Medication Sig Dispense Refill  . ALPRAZolam (XANAX) 0.25 MG tablet Take 1 tablet (0.25 mg total) by mouth at bedtime as needed for anxiety. 30 tablet 0  . Ascorbic Acid (VITAMIN C) 100 MG tablet Take 100 mg by mouth daily.    . Azelastine-Fluticasone (DYMISTA) 137-50 MCG/ACT SUSP Place 1 spray into both nostrils daily.    . Biotin 2500 MCG CAPS Take 1 capsule by mouth daily.    . budesonide (PULMICORT) 0.5 MG/2ML nebulizer solution   10  . Calcium Carbonate (CALTRATE 600 PO) Take 1 tablet by mouth daily.     . calcium citrate-vitamin D (CITRACAL+D) 315-200 MG-UNIT tablet Take by mouth.    . cholecalciferol (VITAMIN D) 1000 units tablet Take 1,000 Units by mouth daily.    Marland Kitchen estradiol (VIVELLE-DOT) 0.025 MG/24HR APPLY ONE PATCH TWICE A WEEK AS DIRECTED 24 patch 0  . montelukast (SINGULAIR) 10 MG tablet   10  . Multiple Vitamin (MULTIVITAMIN) capsule Take 1 capsule by mouth daily.    . progesterone (PROMETRIUM) 100 MG capsule Take 1 capsule (100 mg total) by mouth daily. 90 capsule 3  . telmisartan (MICARDIS) 40 MG tablet TAKE 1 TABLET BY MOUTH DAILY 90 tablet 3  . ALBUTEROL SULFATE HFA IN Inhale 2 puffs into the lungs every 6 (six) hours as needed.    Marland Kitchen amLODipine (NORVASC) 2.5 MG tablet Take 1 tablet (2.5 mg total) by mouth daily. (Patient not taking: Reported on 06/26/2017) 90 tablet 3  . EPIPEN 2-PAK 0.3 MG/0.3ML SOAJ Reported on 07/15/2015    . famotidine (PEPCID) 40 MG tablet Take 1 tablet (40 mg total) by mouth 2 (two) times daily. For 5 days (Patient not taking: Reported on 06/26/2017) 10 tablet 1  . fluticasone furoate-vilanterol (BREO ELLIPTA) 100-25 MCG/INH AEPB Inhale 1 puff into the lungs daily.    Marland Kitchen PRESCRIPTION MEDICATION Allergy Shots weekly     Current Facility-Administered Medications  Medication Dose Route Frequency Provider Last Rate Last Dose  . ipratropium-albuterol (DUONEB) 0.5-2.5 (3) MG/3ML nebulizer solution 3 mL  3 mL Nebulization Once Crecencio Mc, MD        OBJECTIVE: Vitals:   06/26/17 0939  BP: 139/85  Pulse: 77  Resp: 18  Temp: 98 F (36.7 C)  SpO2: 99%     Body mass index is 25.94 kg/m.    ECOG FS:0 - Asymptomatic  General: Well-developed, well-nourished, no acute distress. Eyes: Pink conjunctiva, anicteric sclera. HEENT: Normocephalic, moist mucous membranes, clear oropharnyx. Lungs: Clear to auscultation bilaterally. Heart: Regular rate and rhythm. No rubs, murmurs, or gallops. Abdomen: Soft, nontender, nondistended. No organomegaly noted, normoactive  bowel sounds. Musculoskeletal: No edema, cyanosis, or clubbing. Neuro: Alert, answering all questions appropriately. Cranial nerves grossly intact. Skin: No rashes or petechiae noted. Psych: Normal affect. Lymphatics: No cervical, calvicular, axillary or inguinal LAD.   LAB RESULTS:  Lab Results  Component Value Date   NA 139 06/15/2017   K 4.0 06/15/2017   CL 102 06/15/2017   CO2 28 06/15/2017   GLUCOSE 97 06/15/2017   BUN 15 06/15/2017   CREATININE 0.67 06/15/2017   CALCIUM 9.6 06/15/2017   PROT 7.1 06/15/2017   ALBUMIN 4.5 06/15/2017   AST 13 06/15/2017   ALT 15 06/15/2017   ALKPHOS 73 06/15/2017   BILITOT 0.8 06/15/2017    Lab Results  Component Value Date   WBC 7.7 06/26/2017  NEUTROABS 7.1 06/15/2017   HGB 16.3 (H) 06/26/2017   HCT 47.0 06/26/2017   MCV 91.4 06/26/2017   PLT 320 06/26/2017     STUDIES: No results found.  ASSESSMENT: Erythrocytosis and thrombocytosis  PLAN:   1. Erythrocytosis: Patient's red blood cell count continues to slowly trend up and is now 16.3.  She has a mildly increased total iron as well as percent saturation.  Have ordered hemochromatosis mutation as well as Jak 2 mutation and erythropoietin level for completeness.  These results are pending at time of dictation.  No intervention is needed at this time.  Return to clinic in 4 weeks with repeat laboratory work and further evaluation. 2.  Thrombocytosis: Patient's platelet count is now within normal limits.  Jak 2 mutation pending as above.  Approximately 45 minutes was spent in discussion of which greater than 50% was consultation.   Patient expressed understanding and was in agreement with this plan. She also understands that She can call clinic at any time with any questions, concerns, or complaints.    Lloyd Huger, MD   06/26/2017 1:27 PM

## 2017-06-26 ENCOUNTER — Inpatient Hospital Stay: Payer: BLUE CROSS/BLUE SHIELD | Attending: Oncology | Admitting: Oncology

## 2017-06-26 ENCOUNTER — Encounter: Payer: Self-pay | Admitting: Oncology

## 2017-06-26 ENCOUNTER — Inpatient Hospital Stay: Payer: BLUE CROSS/BLUE SHIELD

## 2017-06-26 VITALS — BP 139/85 | HR 77 | Temp 98.0°F | Resp 18 | Ht 63.25 in | Wt 147.6 lb

## 2017-06-26 DIAGNOSIS — R5383 Other fatigue: Secondary | ICD-10-CM

## 2017-06-26 DIAGNOSIS — I7300 Raynaud's syndrome without gangrene: Secondary | ICD-10-CM | POA: Insufficient documentation

## 2017-06-26 DIAGNOSIS — D751 Secondary polycythemia: Secondary | ICD-10-CM | POA: Insufficient documentation

## 2017-06-26 DIAGNOSIS — I10 Essential (primary) hypertension: Secondary | ICD-10-CM | POA: Diagnosis not present

## 2017-06-26 DIAGNOSIS — Z79899 Other long term (current) drug therapy: Secondary | ICD-10-CM | POA: Insufficient documentation

## 2017-06-26 DIAGNOSIS — D473 Essential (hemorrhagic) thrombocythemia: Secondary | ICD-10-CM | POA: Diagnosis not present

## 2017-06-26 DIAGNOSIS — D75839 Thrombocytosis, unspecified: Secondary | ICD-10-CM

## 2017-06-26 DIAGNOSIS — J301 Allergic rhinitis due to pollen: Secondary | ICD-10-CM | POA: Diagnosis not present

## 2017-06-26 LAB — CBC
HCT: 47 % (ref 35.0–47.0)
HEMOGLOBIN: 16.3 g/dL — AB (ref 12.0–16.0)
MCH: 31.7 pg (ref 26.0–34.0)
MCHC: 34.6 g/dL (ref 32.0–36.0)
MCV: 91.4 fL (ref 80.0–100.0)
PLATELETS: 320 10*3/uL (ref 150–440)
RBC: 5.14 MIL/uL (ref 3.80–5.20)
RDW: 13 % (ref 11.5–14.5)
WBC: 7.7 10*3/uL (ref 3.6–11.0)

## 2017-06-26 LAB — IRON AND TIBC
Iron: 207 ug/dL — ABNORMAL HIGH (ref 28–170)
SATURATION RATIOS: 53 % — AB (ref 10.4–31.8)
TIBC: 393 ug/dL (ref 250–450)
UIBC: 186 ug/dL

## 2017-06-26 LAB — FERRITIN: FERRITIN: 279 ng/mL (ref 11–307)

## 2017-06-27 LAB — ERYTHROPOIETIN: Erythropoietin: 7.4 m[IU]/mL (ref 2.6–18.5)

## 2017-06-27 LAB — CARBON MONOXIDE, BLOOD (PERFORMED AT REF LAB): Carbon Monoxide, Blood: 2.4 % (ref 0.0–3.6)

## 2017-06-28 ENCOUNTER — Encounter (INDEPENDENT_AMBULATORY_CARE_PROVIDER_SITE_OTHER): Payer: Self-pay

## 2017-06-28 LAB — COMP PANEL: LEUKEMIA/LYMPHOMA

## 2017-06-29 LAB — HEMOCHROMATOSIS DNA-PCR(C282Y,H63D)

## 2017-06-29 LAB — JAK2 GENOTYPR

## 2017-07-03 DIAGNOSIS — J301 Allergic rhinitis due to pollen: Secondary | ICD-10-CM | POA: Diagnosis not present

## 2017-07-07 ENCOUNTER — Ambulatory Visit
Admission: RE | Admit: 2017-07-07 | Discharge: 2017-07-07 | Disposition: A | Discharge status: Home / Self Care | Payer: BLUE CROSS/BLUE SHIELD | Source: Ambulatory Visit | Attending: Internal Medicine | Admitting: Internal Medicine

## 2017-07-07 DIAGNOSIS — M542 Cervicalgia: Secondary | ICD-10-CM | POA: Diagnosis not present

## 2017-07-07 DIAGNOSIS — M4802 Spinal stenosis, cervical region: Secondary | ICD-10-CM | POA: Insufficient documentation

## 2017-07-07 DIAGNOSIS — M5031 Other cervical disc degeneration,  high cervical region: Secondary | ICD-10-CM | POA: Insufficient documentation

## 2017-07-07 DIAGNOSIS — Z981 Arthrodesis status: Secondary | ICD-10-CM | POA: Diagnosis not present

## 2017-07-07 DIAGNOSIS — M50223 Other cervical disc displacement at C6-C7 level: Secondary | ICD-10-CM | POA: Diagnosis not present

## 2017-07-08 ENCOUNTER — Other Ambulatory Visit: Payer: Self-pay | Admitting: Internal Medicine

## 2017-07-08 DIAGNOSIS — Z780 Asymptomatic menopausal state: Secondary | ICD-10-CM

## 2017-07-13 DIAGNOSIS — J301 Allergic rhinitis due to pollen: Secondary | ICD-10-CM | POA: Diagnosis not present

## 2017-07-19 ENCOUNTER — Ambulatory Visit
Admission: RE | Admit: 2017-07-19 | Discharge: 2017-07-19 | Disposition: A | Discharge status: Home / Self Care | Payer: BLUE CROSS/BLUE SHIELD | Source: Ambulatory Visit | Attending: Internal Medicine | Admitting: Internal Medicine

## 2017-07-19 ENCOUNTER — Ambulatory Visit
Admission: RE | Admit: 2017-07-19 | Discharge: 2017-07-19 | Disposition: A | Discharge status: Home / Self Care | Payer: BLUE CROSS/BLUE SHIELD | Source: Ambulatory Visit | Attending: Obstetrics and Gynecology | Admitting: Obstetrics and Gynecology

## 2017-07-19 DIAGNOSIS — M85852 Other specified disorders of bone density and structure, left thigh: Secondary | ICD-10-CM | POA: Diagnosis not present

## 2017-07-19 DIAGNOSIS — Z780 Asymptomatic menopausal state: Secondary | ICD-10-CM | POA: Insufficient documentation

## 2017-07-19 DIAGNOSIS — Z1231 Encounter for screening mammogram for malignant neoplasm of breast: Secondary | ICD-10-CM | POA: Insufficient documentation

## 2017-07-19 DIAGNOSIS — M4722 Other spondylosis with radiculopathy, cervical region: Secondary | ICD-10-CM | POA: Diagnosis not present

## 2017-07-19 DIAGNOSIS — M4802 Spinal stenosis, cervical region: Secondary | ICD-10-CM | POA: Diagnosis not present

## 2017-07-19 DIAGNOSIS — M542 Cervicalgia: Secondary | ICD-10-CM | POA: Diagnosis not present

## 2017-07-20 ENCOUNTER — Encounter: Payer: Self-pay | Admitting: Obstetrics and Gynecology

## 2017-07-21 DIAGNOSIS — J301 Allergic rhinitis due to pollen: Secondary | ICD-10-CM | POA: Diagnosis not present

## 2017-07-24 DIAGNOSIS — J301 Allergic rhinitis due to pollen: Secondary | ICD-10-CM | POA: Diagnosis not present

## 2017-07-28 ENCOUNTER — Ambulatory Visit: Payer: BLUE CROSS/BLUE SHIELD | Admitting: Obstetrics and Gynecology

## 2017-07-30 NOTE — Progress Notes (Deleted)
[place]  [phone]) [phone] [phone]) [phone]  ID: Jean Luna OB: [birth year]  MR#: [id number]  HKV#:[id number]  Patient Care Team: Jean Mc, MD as PCP - General (Internal Medicine) Jean Merritts, MD as Consulting Physician (Cardiology)  CHIEF COMPLAINT: Erythrocytosis and thrombocytosis.  INTERVAL HISTORY: Patient is a 62 year old female who was noted to have an increased red blood cell as well as platelet count on routine yearly blood work.  She admits to increased fatigue, but otherwise feels well.  She has no neurologic complaints.  She denies any recent fevers or illnesses.  She has a good appetite and denies weight loss.  She has no chest pain or shortness of breath.  She denies any nausea, vomiting, constipation, or diarrhea.  She has no urinary complaints.  Patient feels at her baseline offers no specific complaints today.  REVIEW OF SYSTEMS:   Review of Systems  Constitutional: Positive for malaise/fatigue. Negative for fever and weight loss.  Respiratory: Negative.  Negative for cough and shortness of breath.   Cardiovascular: Negative.  Negative for chest pain and leg swelling.  Gastrointestinal: Negative.  Negative for abdominal pain.  Genitourinary: Negative.  Negative for dysuria.  Musculoskeletal: Negative.  Negative for myalgias.  Skin: Negative.  Negative for rash.  Neurological: Negative.  Negative for sensory change, focal weakness and weakness.  Psychiatric/Behavioral: Negative.  The patient is not nervous/anxious.     As per HPI. Otherwise, a complete review of systems is negative.  PAST MEDICAL HISTORY: Past Medical History:  Diagnosis Date  . Asthma   . Cervical disc herniation   . Facial paralysis/Bells palsy    6 week paralysis of right side of face   . Fibroid   . History of abnormal cervical Pap smear   . History of colon polyps    adenomatous  . Hypertension   . Migraine, menstrual    resolved with  menopause  . Osteopenia 2019  . Raynaud phenomenon     PAST SURGICAL HISTORY: Past Surgical History:  Procedure Laterality Date  . CERVICAL BIOPSY  W/ LOOP ELECTRODE EXCISION  1991  . CERVICAL CONE BIOPSY  1985  . CERVICAL DISC SURGERY  11/2012   --removed bone spurs, bulging disc  . COLPOSCOPY  1984   no treatment  . FINGER SURGERY Left 06/2014   Left index finger.   Marland Kitchen NASAL SINUS SURGERY      FAMILY HISTORY: Family History  Problem Relation Age of Onset  . Hypertension Mother   . Stroke Mother 59       cerebral aneurysm  . Heart disease Father        CABG  . COPD Father 88  . Diabetes Father   . Heart failure Father   . Heart attack Father 13  . Breast cancer Maternal Grandmother 61       dec 89   . Diabetes Paternal Grandfather   . Heart attack Paternal Grandfather   . Heart disease Paternal Grandfather   . Heart failure Paternal Grandfather   . Heart disease Paternal Uncle   . Stroke Paternal Uncle     ADVANCED DIRECTIVES (Y/N):  N  HEALTH MAINTENANCE: Social History   Tobacco Use  . Smoking status: Never Smoker  . Smokeless tobacco: Never Used  Substance Use Topics  . Alcohol use: No  . Drug use: No     Colonoscopy:  PAP:  Bone density:  Lipid panel:  Allergies  Allergen Reactions  . Shellfish Allergy Swelling  .  Other     Other reaction(s): Other (See Comments) Dust, certain trees, molds, grasses Trout-from allergy test    Current Outpatient Medications  Medication Sig Dispense Refill  . ALBUTEROL SULFATE HFA IN Inhale 2 puffs into the lungs every 6 (six) hours as needed.    . ALPRAZolam (XANAX) 0.25 MG tablet Take 1 tablet (0.25 mg total) by mouth at bedtime as needed for anxiety. 30 tablet 0  . amLODipine (NORVASC) 2.5 MG tablet Take 1 tablet (2.5 mg total) by mouth daily. (Patient not taking: Reported on 06/26/2017) 90 tablet 3  . Ascorbic Acid (VITAMIN C) 100 MG tablet Take 100 mg by mouth daily.    . Azelastine-Fluticasone (DYMISTA)  137-50 MCG/ACT SUSP Place 1 spray into both nostrils daily.    . Biotin 2500 MCG CAPS Take 1 capsule by mouth daily.    . budesonide (PULMICORT) 0.5 MG/2ML nebulizer solution   10  . Calcium Carbonate (CALTRATE 600 PO) Take 1 tablet by mouth daily.    . calcium citrate-vitamin D (CITRACAL+D) 315-200 MG-UNIT tablet Take by mouth.    . cholecalciferol (VITAMIN D) 1000 units tablet Take 1,000 Units by mouth daily.    Marland Kitchen EPIPEN 2-PAK 0.3 MG/0.3ML SOAJ Reported on 07/15/2015    . estradiol (VIVELLE-DOT) 0.025 MG/24HR APPLY ONE PATCH TWICE A WEEK AS DIRECTED 8 patch 5  . famotidine (PEPCID) 40 MG tablet Take 1 tablet (40 mg total) by mouth 2 (two) times daily. For 5 days (Patient not taking: Reported on 06/26/2017) 10 tablet 1  . fluticasone furoate-vilanterol (BREO ELLIPTA) 100-25 MCG/INH AEPB Inhale 1 puff into the lungs daily.    . montelukast (SINGULAIR) 10 MG tablet   10  . Multiple Vitamin (MULTIVITAMIN) capsule Take 1 capsule by mouth daily.    Marland Kitchen PRESCRIPTION MEDICATION Allergy Shots weekly    . progesterone (PROMETRIUM) 100 MG capsule TAKE ONE TABLET BY MOUTH EVERY DAY 90 capsule 1  . telmisartan (MICARDIS) 40 MG tablet TAKE 1 TABLET BY MOUTH DAILY 90 tablet 3   Current Facility-Administered Medications  Medication Dose Route Frequency Provider Last Rate Last Dose  . ipratropium-albuterol (DUONEB) 0.5-2.5 (3) MG/3ML nebulizer solution 3 mL  3 mL Nebulization Once Jean Mc, MD        OBJECTIVE: There were no vitals filed for this visit.   There is no height or weight on file to calculate BMI.    ECOG FS:0 - Asymptomatic  General: Well-developed, well-nourished, no acute distress. Eyes: Pink conjunctiva, anicteric sclera. HEENT: Normocephalic, moist mucous membranes, clear oropharnyx. Lungs: Clear to auscultation bilaterally. Heart: Regular rate and rhythm. No rubs, murmurs, or gallops. Abdomen: Soft, nontender, nondistended. No organomegaly noted, normoactive bowel  sounds. Musculoskeletal: No edema, cyanosis, or clubbing. Neuro: Alert, answering all questions appropriately. Cranial nerves grossly intact. Skin: No rashes or petechiae noted. Psych: Normal affect. Lymphatics: No cervical, calvicular, axillary or inguinal LAD.   LAB RESULTS:  Lab Results  Component Value Date   NA 139 06/15/2017   K 4.0 06/15/2017   CL 102 06/15/2017   CO2 28 06/15/2017   GLUCOSE 97 06/15/2017   BUN 15 06/15/2017   CREATININE 0.67 06/15/2017   CALCIUM 9.6 06/15/2017   PROT 7.1 06/15/2017   ALBUMIN 4.5 06/15/2017   AST 13 06/15/2017   ALT 15 06/15/2017   ALKPHOS 73 06/15/2017   BILITOT 0.8 06/15/2017    Lab Results  Component Value Date   WBC 7.7 06/26/2017   NEUTROABS 7.1 06/15/2017   HGB 16.3 (H)  06/26/2017   HCT 47.0 06/26/2017   MCV 91.4 06/26/2017   PLT 320 06/26/2017     STUDIES: Mr Cervical Spine Wo Contrast  Result Date: 07/07/2017 CLINICAL DATA:  Right-sided burning sensation. Right upper arm pain for 8 months. EXAM: MRI CERVICAL SPINE WITHOUT CONTRAST TECHNIQUE: Multiplanar, multisequence MR imaging of the cervical spine was performed. No intravenous contrast was administered. COMPARISON:  02/02/2011 FINDINGS: Alignment: Physiologic. Vertebrae: No fracture, evidence of discitis, or bone lesion. Cord: Normal signal and morphology. Posterior Fossa, vertebral arteries, paraspinal tissues: Posterior fossa demonstrates no focal abnormality. Vertebral artery flow voids are maintained. Paraspinal soft tissues are unremarkable. Disc levels: Discs: Anterior cervical fusion at C5-6 without hardware failure or complication. Solid osseous bridging across the C5-6 disc space. Mild degenerative disc disease with disc height loss at C6-7. C2-3: No significant disc bulge. No neural foraminal stenosis. No central canal stenosis. C3-4: No significant disc bulge. Right uncovertebral degenerative changes. Mild right foraminal stenosis. Mild left facet arthropathy.  Mild left foraminal stenosis. No central canal stenosis. C4-5: Mild broad-based disc bulge. No neural foraminal stenosis. No central canal stenosis. C5-6: Interbody fusion with right foraminal osseous ridging. Mild right foraminal stenosis. No left foraminal stenosis. No central canal stenosis. C6-7: Mild broad-based disc bulge. No neural foraminal stenosis. No central canal stenosis. C7-T1: No significant disc bulge. No neural foraminal stenosis. No central canal stenosis. T1-2: Mild broad-based disc bulge. IMPRESSION: 1. Anterior cervical disc fusion at C5-6 without hardware failure or complication. Osseous ridging along the right foraminal aspect resulting in mild foraminal stenosis. 2. At C3-4 there is right uncovertebral degenerative changes and mild right foraminal stenosis. Electronically Signed   By: Kathreen Devoid   On: 07/07/2017 10:11   Dg Bone Density  Result Date: 07/19/2017 EXAM: DUAL X-RAY ABSORPTIOMETRY (DXA) FOR BONE MINERAL DENSITY IMPRESSION: Dear Dr.  Yisroel Ramming, Your patient Jean Luna completed a FRAX assessment on 07/19/2017 using the [place] (analysis version: 14.10) manufactured by [employer]. The following summarizes the results of our evaluation. PATIENT BIOGRAPHICAL: Name: Adah, Stoneberg Patient ID: [id number] Birth Date: December 30, 1955 Height:    63.0 in. Gender:     Female    [age]       Weight:    151.0 lbs. Ethnicity:  White                            Exam Date: 07/19/2017 FRAX* RESULTS:  (version: 3.5) 10-year Probability of Fracture1 Major Osteoporotic Fracture2 Hip Fracture 14.8% 1.6% Population: [place] (Caucasian) Risk Factors: History of Fracture (Adult) Based on Femur (Left) Neck BMD 1 -The 10-year probability of fracture may be lower than reported if the patient has received treatment. 2 -Major Osteoporotic Fracture: Clinical Spine, Forearm, Hip or Shoulder *FRAX is a Materials engineer of the [employer] for  Metabolic Bone Disease, a [place] (WHO) [facility]. ASSESSMENT: The probability of a major osteoporotic fracture is 14.8% within the next ten years. The probability of a hip fracture is 1.6% within the next ten years. . Dear Dr.  Yisroel Ramming, Your patient Jean Luna completed a BMD test on 07/19/2017 using the [place] (analysis version: 14.10) manufactured by [employer]. The following summarizes the results of our evaluation. PATIENT BIOGRAPHICAL: Name: Comfort, Iversen Patient ID: [id number] Birth Date: 02-24-1955 Height: 63.0 in. Gender: Female Exam Date: 07/19/2017  Weight: 151.0 lbs. Indications: Asthma, Caucasian, Height Loss, High Risk Meds, History of Fracture (Adult), History of Spinal Surgery, Postmenopausal, Vitamin D Deficiency Fractures: Right foot Treatments: calcium w/ vit D, Estradiol, Multi-Vitamin, Progesterone, Vitamin D ASSESSMENT: The BMD measured at Femur Neck Left is 0.820 g/cm2 with a T-score of -1.6. This patient is considered osteopenic according to [place] [facility]) criteria. Site Region Measured Measured WHO Young Adult BMD Date       Age      Classification T-score AP Spine L1-L4 07/19/[age] 63.1 Normal 0.1 1.207 g/cm2 DualFemur Neck Left 07/19/2017 63.1 Osteopenia -1.6 0.820 g/cm2 World Health Organization [facility]) criteria for post-menopausal, Caucasian Women: Normal:       T-score at or above -1 SD Osteopenia:   T-score between -1 and -2.5 SD Osteoporosis: T-score at or below -2.5 SD RECOMMENDATIONS: 1. All patients should optimize calcium and vitamin D intake. 2. Consider FDA-approved medical therapies in postmenopausal women and men aged 83 years and older, based on the following: a. A hip or vertebral(clinical or morphometric) fracture b. T-score < -2.5 at the femoral neck or spine after appropriate evaluation to exclude secondary causes c. Low bone mass (T-score between -1.0 and -2.5 at the femoral neck or spine)  and a 10-year probability of a hip fracture > 3% or a 10-year probability of a major osteoporosis-related fracture > 20% based on the [place]-adapted WHO algorithm d. Clinician judgment and/or patient preferences may indicate treatment for people with 10-year fracture probabilities above or below these levels FOLLOW-UP: People with diagnosed cases of osteoporosis or at high risk for fracture should have regular bone mineral density tests. For patients eligible for Medicare, routine testing is allowed once every 2 years. The testing frequency can be increased to one year for patients who have rapidly progressing disease, those who are receiving or discontinuing medical therapy to restore bone mass, or have additional risk factors. I have reviewed this report, and agree with the above findings. [facility] Electronically Signed   By: Lowella Grip III M.D.   On: 07/19/2017 11:26   Mm 3d Screen Breast Bilateral  Result Date: 07/19/2017 CLINICAL DATA:  Screening. EXAM: DIGITAL SCREENING BILATERAL MAMMOGRAM WITH TOMO AND CAD COMPARISON:  Previous exam(s). ACR Breast Density Category b: There are scattered areas of fibroglandular density. FINDINGS: There are no findings suspicious for malignancy. Images were processed with CAD. IMPRESSION: No mammographic evidence of malignancy. A result letter of this screening mammogram will be mailed directly to the patient. RECOMMENDATION: Screening mammogram in one year. (Code:SM-B-01Y) BI-RADS CATEGORY  1: Negative. Electronically Signed   By: Claudie Revering M.D.   On: 07/19/2017 13:50    ASSESSMENT: Erythrocytosis and thrombocytosis  PLAN:   1. Erythrocytosis: Patient's red blood cell count continues to slowly trend up and is now 16.3.  She has a mildly increased total iron as well as percent saturation.  Have ordered hemochromatosis mutation as well as Jak 2 mutation and erythropoietin level for completeness.  These results are pending at time of dictation.  No  intervention is needed at this time.  Return to clinic in 4 weeks with repeat laboratory work and further evaluation. 2.  Thrombocytosis: Patient's platelet count is now within normal limits.  Jak 2 mutation pending as above.  Approximately 45 minutes was spent in discussion of which greater than 50% was consultation.   Patient expressed understanding and was in agreement with this plan. She also understands that She can call clinic at any time with any  questions, concerns, or complaints.    Lloyd Huger, MD   07/30/2017 10:40 PM

## 2017-07-31 ENCOUNTER — Inpatient Hospital Stay: Payer: BLUE CROSS/BLUE SHIELD | Admitting: Oncology

## 2017-07-31 ENCOUNTER — Inpatient Hospital Stay: Payer: BLUE CROSS/BLUE SHIELD

## 2017-07-31 DIAGNOSIS — J301 Allergic rhinitis due to pollen: Secondary | ICD-10-CM | POA: Diagnosis not present

## 2017-08-10 DIAGNOSIS — J301 Allergic rhinitis due to pollen: Secondary | ICD-10-CM | POA: Diagnosis not present

## 2017-08-21 DIAGNOSIS — J301 Allergic rhinitis due to pollen: Secondary | ICD-10-CM | POA: Diagnosis not present

## 2017-08-31 DIAGNOSIS — J301 Allergic rhinitis due to pollen: Secondary | ICD-10-CM | POA: Diagnosis not present

## 2017-09-07 DIAGNOSIS — J301 Allergic rhinitis due to pollen: Secondary | ICD-10-CM | POA: Diagnosis not present

## 2017-09-11 ENCOUNTER — Other Ambulatory Visit: Payer: Self-pay | Admitting: Internal Medicine

## 2017-09-11 DIAGNOSIS — L03319 Cellulitis of trunk, unspecified: Principal | ICD-10-CM

## 2017-09-11 DIAGNOSIS — L02219 Cutaneous abscess of trunk, unspecified: Secondary | ICD-10-CM | POA: Insufficient documentation

## 2017-09-11 MED ORDER — DOXYCYCLINE HYCLATE 100 MG PO TABS: 100.0000 mg | ORAL_TABLET | Freq: Two times a day (BID) | ORAL | 0 refills | Status: DC

## 2017-09-14 DIAGNOSIS — J301 Allergic rhinitis due to pollen: Secondary | ICD-10-CM | POA: Diagnosis not present

## 2017-09-20 DIAGNOSIS — J301 Allergic rhinitis due to pollen: Secondary | ICD-10-CM | POA: Diagnosis not present

## 2017-09-28 DIAGNOSIS — J301 Allergic rhinitis due to pollen: Secondary | ICD-10-CM | POA: Diagnosis not present

## 2017-10-05 DIAGNOSIS — J301 Allergic rhinitis due to pollen: Secondary | ICD-10-CM | POA: Diagnosis not present

## 2017-10-12 DIAGNOSIS — J301 Allergic rhinitis due to pollen: Secondary | ICD-10-CM | POA: Diagnosis not present

## 2017-10-13 DIAGNOSIS — J301 Allergic rhinitis due to pollen: Secondary | ICD-10-CM | POA: Diagnosis not present

## 2017-10-19 DIAGNOSIS — J301 Allergic rhinitis due to pollen: Secondary | ICD-10-CM | POA: Diagnosis not present

## 2017-10-26 DIAGNOSIS — J301 Allergic rhinitis due to pollen: Secondary | ICD-10-CM | POA: Diagnosis not present

## 2017-10-31 DIAGNOSIS — Z23 Encounter for immunization: Secondary | ICD-10-CM | POA: Diagnosis not present

## 2017-11-02 DIAGNOSIS — J301 Allergic rhinitis due to pollen: Secondary | ICD-10-CM | POA: Diagnosis not present

## 2017-11-02 DIAGNOSIS — J4530 Mild persistent asthma, uncomplicated: Secondary | ICD-10-CM | POA: Diagnosis not present

## 2017-11-02 DIAGNOSIS — R0609 Other forms of dyspnea: Secondary | ICD-10-CM | POA: Diagnosis not present

## 2017-11-13 DIAGNOSIS — J301 Allergic rhinitis due to pollen: Secondary | ICD-10-CM | POA: Diagnosis not present

## 2017-11-20 ENCOUNTER — Ambulatory Visit: Payer: BLUE CROSS/BLUE SHIELD | Admitting: Cardiovascular Disease

## 2017-11-23 DIAGNOSIS — J301 Allergic rhinitis due to pollen: Secondary | ICD-10-CM | POA: Diagnosis not present

## 2017-11-27 DIAGNOSIS — M75121 Complete rotator cuff tear or rupture of right shoulder, not specified as traumatic: Secondary | ICD-10-CM | POA: Diagnosis not present

## 2017-11-27 DIAGNOSIS — M25511 Pain in right shoulder: Secondary | ICD-10-CM | POA: Diagnosis not present

## 2017-11-27 DIAGNOSIS — G8929 Other chronic pain: Secondary | ICD-10-CM | POA: Diagnosis not present

## 2017-11-27 DIAGNOSIS — M7602 Gluteal tendinitis, left hip: Secondary | ICD-10-CM | POA: Diagnosis not present

## 2017-11-30 DIAGNOSIS — J301 Allergic rhinitis due to pollen: Secondary | ICD-10-CM | POA: Diagnosis not present

## 2017-12-01 DIAGNOSIS — M75121 Complete rotator cuff tear or rupture of right shoulder, not specified as traumatic: Secondary | ICD-10-CM | POA: Diagnosis not present

## 2017-12-07 DIAGNOSIS — J301 Allergic rhinitis due to pollen: Secondary | ICD-10-CM | POA: Diagnosis not present

## 2017-12-08 DIAGNOSIS — M75101 Unspecified rotator cuff tear or rupture of right shoulder, not specified as traumatic: Secondary | ICD-10-CM | POA: Diagnosis not present

## 2017-12-11 DIAGNOSIS — J309 Allergic rhinitis, unspecified: Secondary | ICD-10-CM | POA: Diagnosis not present

## 2017-12-14 DIAGNOSIS — J301 Allergic rhinitis due to pollen: Secondary | ICD-10-CM | POA: Diagnosis not present

## 2017-12-21 DIAGNOSIS — M25511 Pain in right shoulder: Secondary | ICD-10-CM | POA: Diagnosis not present

## 2017-12-21 DIAGNOSIS — M7541 Impingement syndrome of right shoulder: Secondary | ICD-10-CM | POA: Diagnosis not present

## 2017-12-21 DIAGNOSIS — J301 Allergic rhinitis due to pollen: Secondary | ICD-10-CM | POA: Diagnosis not present

## 2017-12-22 ENCOUNTER — Encounter: Payer: Self-pay | Admitting: Internal Medicine

## 2017-12-22 ENCOUNTER — Ambulatory Visit: Payer: BLUE CROSS/BLUE SHIELD | Admitting: Internal Medicine

## 2017-12-22 VITALS — BP 110/64 | HR 80 | Temp 98.3°F | Resp 14 | Ht 63.25 in | Wt 152.4 lb

## 2017-12-22 DIAGNOSIS — D751 Secondary polycythemia: Secondary | ICD-10-CM | POA: Diagnosis not present

## 2017-12-22 DIAGNOSIS — I10 Essential (primary) hypertension: Secondary | ICD-10-CM

## 2017-12-22 DIAGNOSIS — R7301 Impaired fasting glucose: Secondary | ICD-10-CM

## 2017-12-22 DIAGNOSIS — J4520 Mild intermittent asthma, uncomplicated: Secondary | ICD-10-CM

## 2017-12-22 DIAGNOSIS — R5383 Other fatigue: Secondary | ICD-10-CM

## 2017-12-22 DIAGNOSIS — J4530 Mild persistent asthma, uncomplicated: Secondary | ICD-10-CM

## 2017-12-22 LAB — CBC WITH DIFFERENTIAL/PLATELET
BASOS PCT: 0.4 % (ref 0.0–3.0)
Basophils Absolute: 0 10*3/uL (ref 0.0–0.1)
EOS PCT: 1.2 % (ref 0.0–5.0)
Eosinophils Absolute: 0.1 10*3/uL (ref 0.0–0.7)
HEMATOCRIT: 43.4 % (ref 36.0–46.0)
HEMOGLOBIN: 14.9 g/dL (ref 12.0–15.0)
LYMPHS PCT: 26.6 % (ref 12.0–46.0)
Lymphs Abs: 1.8 10*3/uL (ref 0.7–4.0)
MCHC: 34.5 g/dL (ref 30.0–36.0)
MCV: 93.4 fl (ref 78.0–100.0)
MONOS PCT: 9.3 % (ref 3.0–12.0)
Monocytes Absolute: 0.6 10*3/uL (ref 0.1–1.0)
Neutro Abs: 4.2 10*3/uL (ref 1.4–7.7)
Neutrophils Relative %: 62.5 % (ref 43.0–77.0)
Platelets: 292 10*3/uL (ref 150.0–400.0)
RBC: 4.65 Mil/uL (ref 3.87–5.11)
RDW: 12.8 % (ref 11.5–15.5)
WBC: 6.7 10*3/uL (ref 4.0–10.5)

## 2017-12-22 LAB — HEMOGLOBIN A1C: HEMOGLOBIN A1C: 5.5 % (ref 4.6–6.5)

## 2017-12-22 MED ORDER — BUPROPION HCL ER (XL) 150 MG PO TB24: 150.0000 mg | ORAL_TABLET | Freq: Every day | ORAL | 2 refills | Status: DC

## 2017-12-22 NOTE — Patient Instructions (Signed)
Your blood pressure looks great !  No changes are needed to your regimen    Trial of wellbutrin once daily  with breakfast for fatigue   Let me know if you want to try celebrex once daily for Joint pain

## 2017-12-22 NOTE — Progress Notes (Signed)
Subjective:  Patient ID: Jean Luna, female    DOB: February 11, 1956  Age: 62 y.o. MRN: [id number]  CC: The primary encounter diagnosis was Impaired fasting glucose. Diagnoses of Polycythemia, Erythrocytosis, Essential hypertension, Fatigue, unspecified type, Asthma in adult, mild intermittent, uncomplicated, and Mild persistent asthma with allergic rhinitis without complication were also pertinent to this visit.  HPI [name] presents for follow up on hypertension ,  Insomnia and fatigue  She Started PT  Yesterday  for persistent pain in her right shoulder , ordered by Ortho after MRI was done which showed tendonosis  And rotator cuff pathology.   MRI reviewed   She continues to report excessive fatigue despite being physically active and sleeping well.  Takes a short daytime nap .  Prior workup for anemia,  Sleep apnea, negative.  Discussed trial of wellbutrin .  Hypertension: patient checks blood pressure twice weekly at home.  Readings have been for the most part > 140/80 at rest . Patient is following a reduce salt diet most days and is taking medications as prescribed    Outpatient Medications Prior to Visit  Medication Sig Dispense Refill  . ALBUTEROL SULFATE HFA IN Inhale 2 puffs into the lungs every 6 (six) hours as needed.    Marland Kitchen amLODipine (NORVASC) 2.5 MG tablet Take 1 tablet (2.5 mg total) by mouth daily. 90 tablet 3  . Ascorbic Acid (VITAMIN C) 100 MG tablet Take 100 mg by mouth daily.    . Azelastine-Fluticasone (DYMISTA) 137-50 MCG/ACT SUSP Place 1 spray into both nostrils daily.    . Biotin 2500 MCG CAPS Take 1 capsule by mouth daily.    . budesonide (PULMICORT) 0.5 MG/2ML nebulizer solution   10  . Calcium Carbonate (CALTRATE 600 PO) Take 1 tablet by mouth daily.    . calcium citrate-vitamin D (CITRACAL+D) 315-200 MG-UNIT tablet Take by mouth.    . cholecalciferol (VITAMIN D) 1000 units tablet Take 1,000 Units by mouth daily.    Marland Kitchen EPIPEN 2-PAK 0.3 MG/0.3ML  SOAJ Reported on 07/15/2015    . estradiol (VIVELLE-DOT) 0.025 MG/24HR APPLY ONE PATCH TWICE A WEEK AS DIRECTED 8 patch 5  . famotidine (PEPCID) 40 MG tablet Take 1 tablet (40 mg total) by mouth 2 (two) times daily. For 5 days 10 tablet 1  . montelukast (SINGULAIR) 10 MG tablet   10  . Multiple Vitamin (MULTIVITAMIN) capsule Take 1 capsule by mouth daily.    Marland Kitchen PRESCRIPTION MEDICATION Allergy Shots weekly    . progesterone (PROMETRIUM) 100 MG capsule TAKE ONE TABLET BY MOUTH EVERY DAY 90 capsule 1  . telmisartan (MICARDIS) 40 MG tablet TAKE 1 TABLET BY MOUTH DAILY 90 tablet 3  . ALPRAZolam (XANAX) 0.25 MG tablet Take 1 tablet (0.25 mg total) by mouth at bedtime as needed for anxiety. 30 tablet 0  . fluticasone furoate-vilanterol (BREO ELLIPTA) 100-25 MCG/INH AEPB Inhale 1 puff into the lungs daily.    Marland Kitchen doxycycline (VIBRA-TABS) 100 MG tablet Take 1 tablet (100 mg total) by mouth 2 (two) times daily. (Patient not taking: Reported on 12/22/2017) 14 tablet 0   Facility-Administered Medications Prior to Visit  Medication Dose Route Frequency Provider Last Rate Last Dose  . ipratropium-albuterol (DUONEB) 0.5-2.5 (3) MG/3ML nebulizer solution 3 mL  3 mL Nebulization Once Crecencio Mc, MD        Review of Systems;  Patient denies headache, fevers, malaise, unintentional weight loss, skin rash, eye pain, sinus congestion and sinus pain, sore throat, dysphagia,  hemoptysis , cough, dyspnea, wheezing, chest pain, palpitations, orthopnea, edema, abdominal pain, nausea, melena, diarrhea, constipation, flank pain, dysuria, hematuria, urinary  Frequency, nocturia, numbness, tingling, seizures,  Focal weakness, Loss of consciousness,  Tremor, insomnia, depression, anxiety, and suicidal ideation.      Objective:  BP 110/64 (BP Location: Left Arm, Patient Position: Sitting, Cuff Size: Normal)   Pulse 80   Temp 98.3 F (36.8 C) (Oral)   Resp 14   Ht 5' 3.25" (1.607 m)   Wt 152 lb 6.4 oz (69.1 kg)   LMP  02/15/2003   SpO2 96%   BMI 26.78 kg/m   BP Readings from Last 3 Encounters:  12/22/17 110/64  06/26/17 139/85  06/21/17 130/78    Wt Readings from Last 3 Encounters:  12/22/17 152 lb 6.4 oz (69.1 kg)  06/26/17 147 lb 9.6 oz (67 kg)  06/21/17 147 lb 6.4 oz (66.9 kg)    General appearance: alert, cooperative and appears stated age Ears: normal TM's and external ear canals both ears Throat: lips, mucosa, and tongue normal; teeth and gums normal Neck: no adenopathy, no carotid bruit, supple, symmetrical, trachea midline and thyroid not enlarged, symmetric, no tenderness/mass/nodules Back: symmetric, no curvature. ROM normal. No CVA tenderness. Lungs: clear to auscultation bilaterally Heart: regular rate and rhythm, S1, S2 normal, no murmur, click, rub or gallop Abdomen: soft, non-tender; bowel sounds normal; no masses,  no organomegaly Pulses: 2+ and symmetric Skin: Skin color, texture, turgor normal. No rashes or lesions Lymph nodes: Cervical, supraclavicular, and axillary nodes normal.  Lab Results  Component Value Date   HGBA1C 5.5 12/22/2017    Lab Results  Component Value Date   CREATININE 0.67 06/15/2017   CREATININE 0.65 12/21/2016   CREATININE 0.62 06/20/2016    Lab Results  Component Value Date   WBC 6.7 12/22/2017   HGB 14.9 12/22/2017   HCT 43.4 12/22/2017   PLT 292.0 12/22/2017   GLUCOSE 97 06/15/2017   CHOL 177 06/15/2017   TRIG 109.0 06/15/2017   HDL 63.90 06/15/2017   LDLDIRECT 137.0 07/04/2014   LDLCALC 91 06/15/2017   ALT 15 06/15/2017   AST 13 06/15/2017   NA 139 06/15/2017   K 4.0 06/15/2017   CL 102 06/15/2017   CREATININE 0.67 06/15/2017   BUN 15 06/15/2017   CO2 28 06/15/2017   TSH 1.12 06/15/2017   HGBA1C 5.5 12/22/2017   MICROALBUR 0.3 03/06/2012    Dg Bone Density  Result Date: 07/19/2017 EXAM: DUAL X-RAY ABSORPTIOMETRY (DXA) FOR BONE MINERAL DENSITY IMPRESSION: Dear Dr.  Yisroel Ramming, Your patient Jean Luna  completed a FRAX assessment on 07/19/2017 using the [place] (analysis version: 14.10) manufactured by [employer]. The following summarizes the results of our evaluation. PATIENT BIOGRAPHICAL: Name: Jean Luna, Aoun Patient ID: [id number] Birth Date: 09/28/1955 Height:    63.0 in. Gender:     Female    [age]       Weight:    151.0 lbs. Ethnicity:  White                            Exam Date: 07/19/2017 FRAX* RESULTS:  (version: 3.5) 10-year Probability of Fracture1 Major Osteoporotic Fracture2 Hip Fracture 14.8% 1.6% Population: [place] (Caucasian) Risk Factors: History of Fracture (Adult) Based on Femur (Left) Neck BMD 1 -The 10-year probability of fracture may be lower than reported if the patient has  received treatment. 2 -Major Osteoporotic Fracture: Clinical Spine, Forearm, Hip or Shoulder *FRAX is a Materials engineer of the [employer] for Metabolic Bone Disease, a [place] (WHO) [facility]. ASSESSMENT: The probability of a major osteoporotic fracture is 14.8% within the next ten years. The probability of a hip fracture is 1.6% within the next ten years. . Dear Dr.  Yisroel Ramming, Your patient Nyilah Kight completed a BMD test on 07/19/2017 using the [place] (analysis version: 14.10) manufactured by [employer]. The following summarizes the results of our evaluation. PATIENT BIOGRAPHICAL: Name: Ryenne, Lynam Patient ID: [id number] Birth Date: [birth year] Height: 63.0 in. Gender: Female Exam Date: 07/19/2017 Weight: 151.0 lbs. Indications: Asthma, Caucasian, Height Loss, High Risk Meds, History of Fracture (Adult), History of Spinal Surgery, Postmenopausal, Vitamin D Deficiency Fractures: Right foot Treatments: calcium w/ vit D, Estradiol, Multi-Vitamin, Progesterone, Vitamin D ASSESSMENT: The BMD measured at Femur Neck Left is 0.820 g/cm2 with a T-score of -1.6. This patient is considered osteopenic  according to [facility]) criteria. Site Region Measured Measured WHO Young Adult BMD Date       Age      Classification T-score AP Spine L1-L4 07/19/2017 63.1 Normal 0.1 1.207 g/cm2 DualFemur Neck Left 07/19/2017 63.1 Osteopenia -1.6 0.820 g/cm2 World Health Organization [facility]) criteria for post-menopausal, Caucasian Women: Normal:       T-score at or above -1 SD Osteopenia:   T-score between -1 and -2.5 SD Osteoporosis: T-score at or below -2.5 SD RECOMMENDATIONS: 1. All patients should optimize calcium and vitamin D intake. 2. Consider FDA-approved medical therapies in postmenopausal women and men aged 35 years and older, based on the following: a. A hip or vertebral(clinical or morphometric) fracture b. T-score < -2.5 at the femoral neck or spine after appropriate evaluation to exclude secondary causes c. Low bone mass (T-score between -1.0 and -2.5 at the femoral neck or spine) and a 10-year probability of a hip fracture > 3% or a 10-year probability of a major osteoporosis-related fracture > 20% based on the [place]-adapted WHO algorithm d. Clinician judgment and/or patient preferences may indicate treatment for people with 10-year fracture probabilities above or below these levels FOLLOW-UP: People with diagnosed cases of osteoporosis or at high risk for fracture should have regular bone mineral density tests. For patients eligible for Medicare, routine testing is allowed once every 2 years. The testing frequency can be increased to one year for patients who have rapidly progressing disease, those who are receiving or discontinuing medical therapy to restore bone mass, or have additional risk factors. I have reviewed this report, and agree with the above findings. [facility] Radiology Electronically Signed   By: Lowella Grip III M.D.   On: 07/19/2017 11:26   Mm 3d Screen Breast Bilateral  Result Date: 07/19/2017 CLINICAL DATA:  Screening. EXAM: DIGITAL SCREENING BILATERAL MAMMOGRAM WITH  TOMO AND CAD COMPARISON:  Previous exam(s). ACR Breast Density Category b: There are scattered areas of fibroglandular density. FINDINGS: There are no findings suspicious for malignancy. Images were processed with CAD. IMPRESSION: No mammographic evidence of malignancy. A result letter of this screening mammogram will be mailed directly to the patient. RECOMMENDATION: Screening mammogram in one year. (Code:SM-B-01Y) BI-RADS CATEGORY  1: Negative. Electronically Signed   By: Claudie Revering M.D.   On: 07/19/2017 13:50    Assessment & Plan:   Problem List Items Addressed This Visit    RESOLVED: Asthma in adult, mild intermittent, uncomplicated  Managed by [facility].  Repeat PFTS done in 2018 and therapy with once daily use of Advair changed to once daily Breo.       Relevant Medications   fluticasone-salmeterol (ADVAIR HFA) 115-21 MCG/ACT inhaler   Erythrocytosis    Hematology workup negative for mutations .  hgb now normalized.  6 month surveillance   Lab Results  Component Value Date   WBC 6.7 12/22/2017   HGB 14.9 12/22/2017   HCT 43.4 12/22/2017   MCV 93.4 12/22/2017   PLT 292.0 12/22/2017         Essential hypertension    Well controlled on current regimen.  No changes today Lab Results  Component Value Date   CREATININE 0.67 06/15/2017   Lab Results  Component Value Date   NA 139 06/15/2017   K 4.0 06/15/2017   CL 102 06/15/2017   CO2 28 06/15/2017         Fatigue    Persistent despite b12 supplementation. Sleep study was negative less than 5 years ago and there has been no weight change. Discussed a trial of wellbutrin       Mild persistent asthma with allergic rhinitis without complication    Managed with singulair, dymista, advair (suboptimal dosing due to patient intolerance) and antihistamine.       Relevant Medications   fluticasone-salmeterol (ADVAIR HFA) 115-21 MCG/ACT inhaler    Other Visit Diagnoses    Impaired fasting glucose    -   Primary   Relevant Orders   Hemoglobin A1c (Completed)   Polycythemia       Relevant Orders   CBC with Differential/Platelet (Completed)    A total of 25 minutes was spent with patient more than half of which was spent in counseling patient on the above mentioned issues , reviewing and explaining recent labs and imaging studies done, and coordination of care.   I have discontinued Tempest Frankland. Vought's fluticasone furoate-vilanterol, ALPRAZolam, and doxycycline. I am also having her start on buPROPion, Cyanocobalamin, and fluticasone-salmeterol. Additionally, I am having her maintain her Azelastine-Fluticasone, EPIPEN 2-PAK, Calcium Carbonate (CALTRATE 600 PO), multivitamin, ALBUTEROL SULFATE HFA IN, PRESCRIPTION MEDICATION, Biotin, famotidine, montelukast, vitamin C, calcium citrate-vitamin D, cholecalciferol, telmisartan, budesonide, amLODipine, progesterone, and estradiol. We will continue to administer ipratropium-albuterol.  Meds ordered this encounter  Medications  . buPROPion (WELLBUTRIN XL) 150 MG 24 hr tablet    Sig: Take 1 tablet (150 mg total) by mouth daily. With breakfast    Dispense:  30 tablet    Refill:  2  . Cyanocobalamin 1000 MCG SUBL    Sig: Place 1 tablet (1,000 mcg total) under the tongue daily.    Dispense:  180 tablet    Refill:  1  . fluticasone-salmeterol (ADVAIR HFA) 115-21 MCG/ACT inhaler    Sig: Inhale 2 puffs into the lungs 2 (two) times daily.    Dispense:  1 Inhaler    Refill:  12    Medications Discontinued During This Encounter  Medication Reason  . doxycycline (VIBRA-TABS) 100 MG tablet Error  . ALPRAZolam (XANAX) 0.25 MG tablet   . fluticasone furoate-vilanterol (BREO ELLIPTA) 100-25 MCG/INH AEPB     Follow-up: No follow-ups on file.   Crecencio Mc, MD

## 2017-12-24 ENCOUNTER — Encounter: Payer: Self-pay | Admitting: Internal Medicine

## 2017-12-24 DIAGNOSIS — J309 Allergic rhinitis, unspecified: Secondary | ICD-10-CM | POA: Insufficient documentation

## 2017-12-24 DIAGNOSIS — J4530 Mild persistent asthma, uncomplicated: Secondary | ICD-10-CM | POA: Insufficient documentation

## 2017-12-24 MED ORDER — FLUTICASONE-SALMETEROL 115-21 MCG/ACT IN AERO: 2.0000 | INHALATION_SPRAY | Freq: Two times a day (BID) | RESPIRATORY_TRACT | 12 refills | Status: DC

## 2017-12-24 MED ORDER — CYANOCOBALAMIN 1000 MCG SL SUBL: 1.0000 | SUBLINGUAL_TABLET | Freq: Every day | SUBLINGUAL | 1 refills | Status: DC

## 2017-12-24 NOTE — Assessment & Plan Note (Signed)
Managed with singulair, dymista, advair (suboptimal dosing due to patient intolerance) and antihistamine.

## 2017-12-24 NOTE — Assessment & Plan Note (Signed)
Managed by [facility].  Repeat PFTS done in 2018 and therapy with once daily use of Advair changed to once daily Breo.

## 2017-12-24 NOTE — Assessment & Plan Note (Signed)
Hematology workup negative for mutations .  hgb now normalized.  6 month surveillance   Lab Results  Component Value Date   WBC 6.7 12/22/2017   HGB 14.9 12/22/2017   HCT 43.4 12/22/2017   MCV 93.4 12/22/2017   PLT 292.0 12/22/2017

## 2017-12-24 NOTE — Assessment & Plan Note (Signed)
Persistent despite b12 supplementation. Sleep study was negative less than 5 years ago and there has been no weight change. Discussed a trial of wellbutrin

## 2017-12-24 NOTE — Assessment & Plan Note (Signed)
Well controlled on current regimen.  No changes today Lab Results  Component Value Date   CREATININE 0.67 06/15/2017   Lab Results  Component Value Date   NA 139 06/15/2017   K 4.0 06/15/2017   CL 102 06/15/2017   CO2 28 06/15/2017

## 2017-12-26 ENCOUNTER — Ambulatory Visit: Payer: BLUE CROSS/BLUE SHIELD | Admitting: Cardiovascular Disease

## 2017-12-28 DIAGNOSIS — M25511 Pain in right shoulder: Secondary | ICD-10-CM | POA: Diagnosis not present

## 2017-12-28 DIAGNOSIS — M7541 Impingement syndrome of right shoulder: Secondary | ICD-10-CM | POA: Diagnosis not present

## 2017-12-28 DIAGNOSIS — J301 Allergic rhinitis due to pollen: Secondary | ICD-10-CM | POA: Diagnosis not present

## 2017-12-31 NOTE — Progress Notes (Signed)
Cardiology Office Note  Date:  01/02/2018   ID:  [name], DOB 07/09/1955, MRN [id number]  PCP:  Jean Mc, MD   Chief Complaint  Patient presents with  . other    12 mo follow up.Denies any Chest pain or Sob. Medications verbally reviewed.     HPI:  [name] is a pleasant 62 year old woman with a long history of  severe asthma, Calcium score of zero in 2017 snoring, sinus issues followed by Dr. Tami Ribas,   seen for severe shortness of breath,  who presents for discussion of her breathing and family history of coronary artery disease  Tired all the time Sleeps 5 to 6 hours Nap in afternoon  High blood pressures on Advil Takes amlodipine only as needed  Was walking every morning, Did not help with fatigue Now no longer doing exercise  Does not feel that she is depressed Denies any chest pain, shortness of breath Feels her asthma has been stable Uses inhalers, mask when outside Otherwise very active at baseline   family history of coronary artery disease as numerous family members have had heart attack or bypass surgery  Previous sleeping study  EKG personally reviewed by myself on todays visit Shows normal sinus rhythm with rate 84 bpm no significant ST or T wave changes  Other past medical history long extensive history of allergy-induced asthma. Previously treated with prednisone  Father was a previous smoker, had COPD also with cardiac issues, CABG Mother had no coronary disease Patient has a daughter with total cholesterol 260   PMH:   has a past medical history of Asthma, Asthma in adult, mild intermittent, uncomplicated (09/08/3662), Cervical disc herniation, Facial paralysis/Bells palsy, Fibroid, History of abnormal cervical Pap smear, History of colon polyps, Hypertension, Migraine, menstrual, Osteopenia (2019), and Raynaud phenomenon.  PSH:    Past Surgical History:  Procedure Laterality Date  . CERVICAL BIOPSY  W/ LOOP  ELECTRODE EXCISION  1991  . CERVICAL CONE BIOPSY  1985  . CERVICAL DISC SURGERY  11/2012   --removed bone spurs, bulging disc  . COLPOSCOPY  1984   no treatment  . FINGER SURGERY Left 06/2014   Left index finger.   Marland Kitchen NASAL SINUS SURGERY      Current Outpatient Medications  Medication Sig Dispense Refill  . ALBUTEROL SULFATE HFA IN Inhale 2 puffs into the lungs every 6 (six) hours as needed.    Marland Kitchen amLODipine (NORVASC) 2.5 MG tablet Take 1 tablet (2.5 mg total) by mouth daily as needed. 90 tablet 3  . Ascorbic Acid (VITAMIN C) 100 MG tablet Take 100 mg by mouth daily.    . Azelastine-Fluticasone (DYMISTA) 137-50 MCG/ACT SUSP Place 1 spray into both nostrils daily.    . Biotin 2500 MCG CAPS Take 1 capsule by mouth daily.    . budesonide (PULMICORT) 0.5 MG/2ML nebulizer solution   10  . buPROPion (WELLBUTRIN XL) 150 MG 24 hr tablet Take 1 tablet (150 mg total) by mouth daily. With breakfast 30 tablet 2  . Calcium Carbonate (CALTRATE 600 PO) Take 1 tablet by mouth daily.    . calcium citrate-vitamin D (CITRACAL+D) 315-200 MG-UNIT tablet Take by mouth.    . cholecalciferol (VITAMIN D) 1000 units tablet Take 1,000 Units by mouth daily.    . Cyanocobalamin 1000 MCG SUBL Place 1 tablet (1,000 mcg total) under the tongue daily. 180 tablet 1  . EPIPEN 2-PAK 0.3 MG/0.3ML SOAJ Reported on 07/15/2015    . estradiol (VIVELLE-DOT) 0.025 MG/24HR APPLY  ONE PATCH TWICE A WEEK AS DIRECTED 8 patch 5  . famotidine (PEPCID) 40 MG tablet Take 1 tablet (40 mg total) by mouth 2 (two) times daily. For 5 days 10 tablet 1  . fluticasone-salmeterol (ADVAIR HFA) 115-21 MCG/ACT inhaler Inhale 2 puffs into the lungs 2 (two) times daily. 1 Inhaler 12  . montelukast (SINGULAIR) 10 MG tablet   10  . Multiple Vitamin (MULTIVITAMIN) capsule Take 1 capsule by mouth daily.    Marland Kitchen PRESCRIPTION MEDICATION Allergy Shots weekly    . progesterone (PROMETRIUM) 100 MG capsule TAKE ONE TABLET BY MOUTH EVERY DAY 90 capsule 1  .  telmisartan (MICARDIS) 40 MG tablet TAKE 1 TABLET BY MOUTH DAILY 90 tablet 3   Current Facility-Administered Medications  Medication Dose Route Frequency Provider Last Rate Last Dose  . ipratropium-albuterol (DUONEB) 0.5-2.5 (3) MG/3ML nebulizer solution 3 mL  3 mL Nebulization Once Jean Mc, MD         Allergies:   Shellfish allergy and Other   Social History:  The patient  reports that she has never smoked. She has never used smokeless tobacco. She reports that she does not drink alcohol or use drugs.   Family History:   family history includes Breast cancer (age of onset: 31) in her maternal grandmother; COPD (age of onset: 15) in her father; Diabetes in her father and paternal grandfather; Heart attack in her paternal grandfather; Heart attack (age of onset: 71) in her father; Heart disease in her father, paternal grandfather, and paternal uncle; Heart failure in her father and paternal grandfather; Hypertension in her mother; Stroke in her paternal uncle; Stroke (age of onset: 65) in her mother.    Review of Systems: Review of Systems  Constitutional: Negative.   Respiratory: Negative.   Cardiovascular: Negative.   Gastrointestinal: Negative.   Musculoskeletal: Negative.   Neurological: Negative.   Psychiatric/Behavioral: Negative.   All other systems reviewed and are negative.    PHYSICAL EXAM: VS:  BP 128/68 (BP Location: Left Arm, Patient Position: Sitting, Cuff Size: Normal)   Pulse 84   Ht 5\' 4"  (1.626 m)   Wt 152 lb 4 oz (69.1 kg)   LMP 02/15/2003   BMI 26.13 kg/m  , BMI Body mass index is 26.13 kg/m. Constitutional:  oriented to person, place, and time. No distress.  HENT:  Head: Grossly normal Eyes:  no discharge. No scleral icterus.  Neck: No JVD, no carotid bruits  Cardiovascular: Regular rate and rhythm, no murmurs appreciated Pulmonary/Chest: Clear to auscultation bilaterally, no wheezes or rails Abdominal: Soft.  no distension.  no tenderness.   Musculoskeletal: Normal range of motion Neurological:  normal muscle tone. Coordination normal. No atrophy Skin: Skin warm and dry Psychiatric: normal affect, pleasant   Recent Labs: 06/15/2017: ALT 15; BUN 15; Creatinine, Ser 0.67; Potassium 4.0; Sodium 139; TSH 1.12 12/22/2017: Hemoglobin 14.9; Platelets 292.0    Lipid Panel Lab Results  Component Value Date   CHOL 177 06/15/2017   HDL 63.90 06/15/2017   LDLCALC 91 06/15/2017   TRIG 109.0 06/15/2017      Wt Readings from Last 3 Encounters:  01/02/18 152 lb 4 oz (69.1 kg)  12/22/17 152 lb 6.4 oz (69.1 kg)  06/26/17 147 lb 9.6 oz (67 kg)       ASSESSMENT AND PLAN:  Essential hypertension - Plan: EKG 12-Lead Blood pressure stable on Mic[place]is Not on amlodipine No changes made  Shortness of breath - CT coronary calcium score of 0  Shortness  of breath stable, recommended regular walking program  Chronic fatigue Long discussion concerning various causes of fatigue Does not seem to be any of the usual issues or possibly sleep hygiene Has not started Wellbutrin  Family history of coronary arteriosclerosis - Plan: EKG 12-Lead Prior work-up unrevealing  Chest tightness -  No further testing, no symptoms concerning for angina  Disposition:   F/U  12 months as needed   Total encounter time more than 25 minutes  Greater than 50% was spent in counseling and coordination of care with the patient    Orders Placed This Encounter  Procedures  . EKG 12-Lead     Signed, Esmond Plants, M.D., Ph.D. 01/02/2018  [place], [place]

## 2018-01-02 ENCOUNTER — Ambulatory Visit: Payer: BLUE CROSS/BLUE SHIELD | Admitting: Cardiovascular Disease

## 2018-01-02 ENCOUNTER — Encounter: Payer: Self-pay | Admitting: Cardiovascular Disease

## 2018-01-02 VITALS — BP 128/68 | HR 84 | Ht 64.0 in | Wt 152.2 lb

## 2018-01-02 DIAGNOSIS — I10 Essential (primary) hypertension: Secondary | ICD-10-CM

## 2018-01-02 DIAGNOSIS — R0789 Other chest pain: Secondary | ICD-10-CM | POA: Diagnosis not present

## 2018-01-02 DIAGNOSIS — J301 Allergic rhinitis due to pollen: Secondary | ICD-10-CM | POA: Diagnosis not present

## 2018-01-02 DIAGNOSIS — Z8249 Family history of ischemic heart disease and other diseases of the circulatory system: Secondary | ICD-10-CM | POA: Diagnosis not present

## 2018-01-02 DIAGNOSIS — R0602 Shortness of breath: Secondary | ICD-10-CM

## 2018-01-02 NOTE — Patient Instructions (Signed)

## 2018-01-04 DIAGNOSIS — M25511 Pain in right shoulder: Secondary | ICD-10-CM | POA: Diagnosis not present

## 2018-01-04 DIAGNOSIS — M7541 Impingement syndrome of right shoulder: Secondary | ICD-10-CM | POA: Diagnosis not present

## 2018-01-08 ENCOUNTER — Other Ambulatory Visit: Payer: Self-pay | Admitting: Internal Medicine

## 2018-01-08 DIAGNOSIS — M7541 Impingement syndrome of right shoulder: Secondary | ICD-10-CM | POA: Diagnosis not present

## 2018-01-08 DIAGNOSIS — M25511 Pain in right shoulder: Secondary | ICD-10-CM | POA: Diagnosis not present

## 2018-01-16 DIAGNOSIS — M7541 Impingement syndrome of right shoulder: Secondary | ICD-10-CM | POA: Diagnosis not present

## 2018-01-16 DIAGNOSIS — M25511 Pain in right shoulder: Secondary | ICD-10-CM | POA: Diagnosis not present

## 2018-01-18 DIAGNOSIS — J301 Allergic rhinitis due to pollen: Secondary | ICD-10-CM | POA: Diagnosis not present

## 2018-01-23 DIAGNOSIS — M25511 Pain in right shoulder: Secondary | ICD-10-CM | POA: Diagnosis not present

## 2018-01-23 DIAGNOSIS — M7541 Impingement syndrome of right shoulder: Secondary | ICD-10-CM | POA: Diagnosis not present

## 2018-01-25 DIAGNOSIS — J301 Allergic rhinitis due to pollen: Secondary | ICD-10-CM | POA: Diagnosis not present

## 2018-01-30 ENCOUNTER — Other Ambulatory Visit: Payer: Self-pay | Admitting: Internal Medicine

## 2018-01-30 DIAGNOSIS — Z780 Asymptomatic menopausal state: Secondary | ICD-10-CM

## 2018-01-31 DIAGNOSIS — J301 Allergic rhinitis due to pollen: Secondary | ICD-10-CM | POA: Diagnosis not present

## 2018-02-19 DIAGNOSIS — J301 Allergic rhinitis due to pollen: Secondary | ICD-10-CM | POA: Diagnosis not present

## 2018-02-20 DIAGNOSIS — M25562 Pain in left knee: Secondary | ICD-10-CM | POA: Diagnosis not present

## 2018-02-20 DIAGNOSIS — S76012D Strain of muscle, fascia and tendon of left hip, subsequent encounter: Secondary | ICD-10-CM | POA: Diagnosis not present

## 2018-02-20 DIAGNOSIS — M94262 Chondromalacia, left knee: Secondary | ICD-10-CM | POA: Diagnosis not present

## 2018-02-20 DIAGNOSIS — M25552 Pain in left hip: Secondary | ICD-10-CM | POA: Diagnosis not present

## 2018-02-26 DIAGNOSIS — J301 Allergic rhinitis due to pollen: Secondary | ICD-10-CM | POA: Diagnosis not present

## 2018-03-05 DIAGNOSIS — J301 Allergic rhinitis due to pollen: Secondary | ICD-10-CM | POA: Diagnosis not present

## 2018-03-06 DIAGNOSIS — M25562 Pain in left knee: Secondary | ICD-10-CM | POA: Diagnosis not present

## 2018-03-06 DIAGNOSIS — M94262 Chondromalacia, left knee: Secondary | ICD-10-CM | POA: Diagnosis not present

## 2018-03-06 DIAGNOSIS — M25552 Pain in left hip: Secondary | ICD-10-CM | POA: Diagnosis not present

## 2018-03-06 DIAGNOSIS — S76012D Strain of muscle, fascia and tendon of left hip, subsequent encounter: Secondary | ICD-10-CM | POA: Diagnosis not present

## 2018-03-13 DIAGNOSIS — M94262 Chondromalacia, left knee: Secondary | ICD-10-CM | POA: Diagnosis not present

## 2018-03-13 DIAGNOSIS — M25552 Pain in left hip: Secondary | ICD-10-CM | POA: Diagnosis not present

## 2018-03-13 DIAGNOSIS — S76012D Strain of muscle, fascia and tendon of left hip, subsequent encounter: Secondary | ICD-10-CM | POA: Diagnosis not present

## 2018-03-13 DIAGNOSIS — M25562 Pain in left knee: Secondary | ICD-10-CM | POA: Diagnosis not present

## 2018-03-23 DIAGNOSIS — J301 Allergic rhinitis due to pollen: Secondary | ICD-10-CM | POA: Diagnosis not present

## 2018-03-26 DIAGNOSIS — J301 Allergic rhinitis due to pollen: Secondary | ICD-10-CM | POA: Diagnosis not present

## 2018-03-27 DIAGNOSIS — M25552 Pain in left hip: Secondary | ICD-10-CM | POA: Diagnosis not present

## 2018-03-27 DIAGNOSIS — S76021D Laceration of muscle, fascia and tendon of right hip, subsequent encounter: Secondary | ICD-10-CM | POA: Diagnosis not present

## 2018-03-27 DIAGNOSIS — M25562 Pain in left knee: Secondary | ICD-10-CM | POA: Diagnosis not present

## 2018-03-27 DIAGNOSIS — M94262 Chondromalacia, left knee: Secondary | ICD-10-CM | POA: Diagnosis not present

## 2018-04-05 DIAGNOSIS — J301 Allergic rhinitis due to pollen: Secondary | ICD-10-CM | POA: Diagnosis not present

## 2018-04-12 DIAGNOSIS — J301 Allergic rhinitis due to pollen: Secondary | ICD-10-CM | POA: Diagnosis not present

## 2018-04-19 DIAGNOSIS — J301 Allergic rhinitis due to pollen: Secondary | ICD-10-CM | POA: Diagnosis not present

## 2018-04-26 DIAGNOSIS — J301 Allergic rhinitis due to pollen: Secondary | ICD-10-CM | POA: Diagnosis not present

## 2018-04-27 DIAGNOSIS — Z85828 Personal history of other malignant neoplasm of skin: Secondary | ICD-10-CM | POA: Diagnosis not present

## 2018-04-27 DIAGNOSIS — X32XXXA Exposure to sunlight, initial encounter: Secondary | ICD-10-CM | POA: Diagnosis not present

## 2018-04-27 DIAGNOSIS — D485 Neoplasm of uncertain behavior of skin: Secondary | ICD-10-CM | POA: Diagnosis not present

## 2018-04-27 DIAGNOSIS — L570 Actinic keratosis: Secondary | ICD-10-CM | POA: Diagnosis not present

## 2018-04-27 DIAGNOSIS — Z08 Encounter for follow-up examination after completed treatment for malignant neoplasm: Secondary | ICD-10-CM | POA: Diagnosis not present

## 2018-04-27 DIAGNOSIS — C44519 Basal cell carcinoma of skin of other part of trunk: Secondary | ICD-10-CM | POA: Diagnosis not present

## 2018-05-03 DIAGNOSIS — J301 Allergic rhinitis due to pollen: Secondary | ICD-10-CM | POA: Diagnosis not present

## 2018-05-03 DIAGNOSIS — J45998 Other asthma: Secondary | ICD-10-CM | POA: Diagnosis not present

## 2018-05-03 DIAGNOSIS — R0609 Other forms of dyspnea: Secondary | ICD-10-CM | POA: Diagnosis not present

## 2018-05-07 ENCOUNTER — Other Ambulatory Visit: Payer: Self-pay | Admitting: Internal Medicine

## 2018-05-07 MED ORDER — ALBUTEROL SULFATE HFA 108 (90 BASE) MCG/ACT IN AERS: 2.0000 | INHALATION_SPRAY | Freq: Four times a day (QID) | RESPIRATORY_TRACT | 3 refills | Status: DC | PRN

## 2018-05-09 ENCOUNTER — Ambulatory Visit (INDEPENDENT_AMBULATORY_CARE_PROVIDER_SITE_OTHER): Payer: BLUE CROSS/BLUE SHIELD

## 2018-05-09 ENCOUNTER — Ambulatory Visit: Payer: BLUE CROSS/BLUE SHIELD

## 2018-05-09 ENCOUNTER — Other Ambulatory Visit: Payer: Self-pay | Admitting: Internal Medicine

## 2018-05-09 ENCOUNTER — Encounter: Payer: Self-pay | Admitting: Internal Medicine

## 2018-05-09 ENCOUNTER — Other Ambulatory Visit: Payer: Self-pay

## 2018-05-09 ENCOUNTER — Ambulatory Visit (INDEPENDENT_AMBULATORY_CARE_PROVIDER_SITE_OTHER): Payer: BLUE CROSS/BLUE SHIELD | Admitting: Internal Medicine

## 2018-05-09 VITALS — BP 122/82 | HR 85 | Temp 98.0°F | Resp 16

## 2018-05-09 DIAGNOSIS — R0601 Orthopnea: Secondary | ICD-10-CM | POA: Diagnosis not present

## 2018-05-09 DIAGNOSIS — J4521 Mild intermittent asthma with (acute) exacerbation: Secondary | ICD-10-CM

## 2018-05-09 DIAGNOSIS — R079 Chest pain, unspecified: Secondary | ICD-10-CM | POA: Diagnosis not present

## 2018-05-09 DIAGNOSIS — R0789 Other chest pain: Secondary | ICD-10-CM

## 2018-05-09 DIAGNOSIS — J4530 Mild persistent asthma, uncomplicated: Secondary | ICD-10-CM | POA: Diagnosis not present

## 2018-05-09 DIAGNOSIS — R0602 Shortness of breath: Secondary | ICD-10-CM | POA: Diagnosis not present

## 2018-05-09 LAB — COMPREHENSIVE METABOLIC PANEL
ALK PHOS: 74 U/L (ref 39–117)
ALT: 18 U/L (ref 0–35)
AST: 12 U/L (ref 0–37)
Albumin: 4.5 g/dL (ref 3.5–5.2)
BUN: 13 mg/dL (ref 6–23)
CO2: 29 mEq/L (ref 19–32)
Calcium: 9.6 mg/dL (ref 8.4–10.5)
Chloride: 102 mEq/L (ref 96–112)
Creatinine, Ser: 0.63 mg/dL (ref 0.40–1.20)
GFR: 95.17 mL/min (ref >60.00)
Glucose, Bld: 99 mg/dL (ref 70–99)
Potassium: 3.7 mEq/L (ref 3.5–5.1)
Sodium: 140 mEq/L (ref 135–145)
Total Bilirubin: 0.5 mg/dL (ref 0.2–1.2)
Total Protein: 6.7 g/dL (ref 6.0–8.3)

## 2018-05-09 LAB — CK TOTAL AND CKMB (NOT AT ARMC)
CK, MB: 0.9 ng/mL (ref 0–5.0)
Relative Index: 2.9 (ref 0–4.0)
Total CK: 31 U/L (ref 29–143)

## 2018-05-09 LAB — D-DIMER, QUANTITATIVE: D-Dimer, Quant: 0.31 mcg/mL FEU (ref <0.50)

## 2018-05-09 LAB — TROPONIN I: TNIDX: 0.01 ug/l (ref 0.00–0.06)

## 2018-05-09 LAB — BRAIN NATRIURETIC PEPTIDE: Pro B Natriuretic peptide (BNP): 34 pg/mL (ref 0.0–100.0)

## 2018-05-09 MED ORDER — FUROSEMIDE 20 MG PO TABS: 20.0000 mg | ORAL_TABLET | Freq: Every day | ORAL | 0 refills | Status: AC

## 2018-05-09 NOTE — Progress Notes (Signed)
Subjective:  Patient ID: Jean Luna, female    DOB: 09-26-1955  Age: 63 y.o. MRN: [id number]  CC: The primary encounter diagnosis was Other chest pain. Diagnoses of Orthopnea, Mild persistent asthma with allergic rhinitis without complication, and Chest pain in adult were also pertinent to this visit.  HPI Jean Luna  Is  63 yr old female with a history of asthma and hypertension who presents for acute onset of chest pain following an asthma exacerbation  HPI:  She reports that  For the past 3 weeks  She has had  chest tightness and pressure at rest along with increased dyspnea with exertion that has resulted in significant modification of activity .  She has been Using advair  twice daily  which is more than her usual use, because she has not  tolerate twice daily use on a regular basis in the past. However the tightness in her chest  has persisted.  She was  unable to see her pulmonologist last week due to the  [facility], but had a telephone encounter on March 19 with Dr Lawerance Cruel where the above symptoms were  Reported. At her pulmonologist's recommendations,  She started taking a 2 week prednisone taper starting at 40 mg daily x 3,  tapering by 10 mg daily .  She is  currently taking 30 mg daily .  She has not noted any change in symptoms   Last night she was awakened by SS chest pain rated at 7-8 and described as pressure radiating to her back.   This occurred at  2 am.  The pain was somewhat relieved  By sitting up so she spent the the rest the night sitting up in bed  She was not able to return to sleep. she recalls feeling flushed but her temp was normal.  Home BP  Was mildly elevated at  150/92 at 10pm befroe she went to bed.  Has been taking her BP meds regularly.  Has noted a 4 lb weight gain over the past month   Felt better this morning after a shower. But  The chest pain is still present    Lipids normal  In May 2019, hgb normal Nov 2019 (H/o erythrocytosis)  Asthma is  managed at [facility] Last chest x ray Nov 2018 noted peribronchial thickening , clear lungs and normal sized heart   She sees a local cardiologist and Dr Donivan Scull last visit was in Nov 2019 with no change to medications.  Last  cardiac workup was normal in 2015: ECHO normal  Treadmill stress test normal as well. Cardiac ct score was zero in 2017   Outpatient Medications Prior to Visit  Medication Sig Dispense Refill  . albuterol (PROVENTIL HFA;VENTOLIN HFA) 108 (90 Base) MCG/ACT inhaler Inhale 2 puffs into the lungs every 6 (six) hours as needed. 18 g 3  . amLODipine (NORVASC) 2.5 MG tablet Take 1 tablet (2.5 mg total) by mouth daily as needed. 90 tablet 3  . Ascorbic Acid (VITAMIN C) 100 MG tablet Take 100 mg by mouth daily.    . Azelastine-Fluticasone (DYMISTA) 137-50 MCG/ACT SUSP Place 1 spray into both nostrils daily.    . Biotin 2500 MCG CAPS Take 1 capsule by mouth daily.    . budesonide (PULMICORT) 0.5 MG/2ML nebulizer solution   10  . buPROPion (WELLBUTRIN XL) 150 MG 24 hr tablet Take 1 tablet (150 mg total) by mouth daily. With breakfast 30 tablet 2  . Calcium Carbonate (CALTRATE 600 PO) Take  1 tablet by mouth daily.    . calcium citrate-vitamin D (CITRACAL+D) 315-200 MG-UNIT tablet Take by mouth.    . cholecalciferol (VITAMIN D) 1000 units tablet Take 1,000 Units by mouth daily.    . cyanocobalamin (,VITAMIN B-12,) 1000 MCG/ML injection INJECT 1ML ONCE A WEEK FOR 4 DOSES THEN MONTHLY FOR 6 MONTHS 10 mL 0  . Cyanocobalamin 1000 MCG SUBL Place 1 tablet (1,000 mcg total) under the tongue daily. 180 tablet 1  . EPIPEN 2-PAK 0.3 MG/0.3ML SOAJ Reported on 07/15/2015    . estradiol (VIVELLE-DOT) 0.025 MG/24HR APPLY ONE PATCH TWICE A WEEK AS DIRECTED 8 patch 5  . famotidine (PEPCID) 40 MG tablet Take 1 tablet (40 mg total) by mouth 2 (two) times daily. For 5 days 10 tablet 1  . fluticasone-salmeterol (ADVAIR HFA) 115-21 MCG/ACT inhaler Inhale 2 puffs into the lungs 2 (two) times daily. 1 Inhaler  12  . montelukast (SINGULAIR) 10 MG tablet   10  . Multiple Vitamin (MULTIVITAMIN) capsule Take 1 capsule by mouth daily.    Marland Kitchen PRESCRIPTION MEDICATION Allergy Shots weekly    . progesterone (PROMETRIUM) 100 MG capsule TAKE 1 CAPSULE BY MOUTH EVERY DAY 90 capsule 1  . telmisartan (MICARDIS) 40 MG tablet TAKE ONE TABLET BY MOUTH EVERY DAY 90 tablet 3  . predniSONE (DELTASONE) 10 MG tablet     . valACYclovir (VALTREX) 1000 MG tablet      Facility-Administered Medications Prior to Visit  Medication Dose Route Frequency Provider Last Rate Last Dose  . ipratropium-albuterol (DUONEB) 0.5-2.5 (3) MG/3ML nebulizer solution 3 mL  3 mL Nebulization Once Crecencio Mc, MD        Review of Systems;  Patient denies headache, fevers, malaise, unintentional weight loss, skin rash, eye pain, sinus congestion and sinus pain, sore throat, dysphagia,  hemoptysis , cough,, palpitations,  edema, abdominal pain, nausea, melena, diarrhea, constipation, flank pain, dysuria, hematuria, urinary  Frequency, nocturia, numbness, tingling, seizures,  Focal weakness, Loss of consciousness,  Tremor, insomnia, depression, anxiety, and suicidal ideation.      Objective:  BP 122/82 (BP Location: Left Arm, Patient Position: Sitting, Cuff Size: Normal)   Pulse 85   Temp 98 F (36.7 C) (Oral)   Resp 16   LMP 02/15/2003   SpO2 99%   BP Readings from Last 3 Encounters:  05/09/18 122/82  01/02/18 128/68  12/22/17 110/64    Wt Readings from Last 3 Encounters:  01/02/18 152 lb 4 oz (69.1 kg)  12/22/17 152 lb 6.4 oz (69.1 kg)  06/26/17 147 lb 9.6 oz (67 kg)    General appearance: alert, cooperative and appears stated age Ears: normal TM's and external ear canals both ears Throat: lips, mucosa, and tongue normal; teeth and gums normal Neck: no adenopathy, no carotid bruit, supple, symmetrical, trachea midline and thyroid not enlarged, symmetric, no tenderness/mass/nodules Back: symmetric, no curvature. ROM normal.  No CVA tenderness. Lungs: clear to auscultation bilaterally Heart: regular rate and rhythm, S1, S2 normal, no murmur, click, rub or gallop Abdomen: soft, non-tender; bowel sounds normal; no masses,  no organomegaly Pulses: 2+ and symmetric Skin: Skin color, texture, turgor normal. No rashes or lesions Lymph nodes: Cervical, supraclavicular, and axillary nodes normal.  Lab Results  Component Value Date   HGBA1C 5.5 12/22/2017    Lab Results  Component Value Date   CREATININE 0.63 05/09/2018   CREATININE 0.67 06/15/2017   CREATININE 0.65 12/21/2016    Lab Results  Component Value Date   WBC 6.7  12/22/2017   HGB 14.9 12/22/2017   HCT 43.4 12/22/2017   PLT 292.0 12/22/2017   GLUCOSE 99 05/09/2018   CHOL 177 06/15/2017   TRIG 109.0 06/15/2017   HDL 63.90 06/15/2017   LDLDIRECT 137.0 07/04/2014   LDLCALC 91 06/15/2017   ALT 18 05/09/2018   AST 12 05/09/2018   NA 140 05/09/2018   K 3.7 05/09/2018   CL 102 05/09/2018   CREATININE 0.63 05/09/2018   BUN 13 05/09/2018   CO2 29 05/09/2018   TSH 1.12 06/15/2017   HGBA1C 5.5 12/22/2017   MICROALBUR 0.3 03/06/2012    Dg Bone Density  Result Date: 07/19/2017 EXAM: DUAL X-RAY ABSORPTIOMETRY (DXA) FOR BONE MINERAL DENSITY IMPRESSION: Dear Dr.  Yisroel Ramming, Your patient MALIA CORSI completed a FRAX assessment on 07/19/2017 using the [place] (analysis version: 14.10) manufactured by [employer]. The following summarizes the results of our evaluation. PATIENT BIOGRAPHICAL: Name: Annemarie, Sebree Patient ID: [id number] Birth Date: Nov 05, 1955 Height:    63.0 in. Gender:     Female    Age:        62.1       Weight:    151.0 lbs. Ethnicity:  White                            Exam Date: 07/19/2017 FRAX* RESULTS:  (version: 3.5) 10-year Probability of Fracture1 Major Osteoporotic Fracture2 Hip Fracture 14.8% 1.6% Population: [place] (Caucasian) Risk Factors: History of Fracture (Adult) Based on Femur (Left) Neck BMD 1 -The  10-year probability of fracture may be lower than reported if the patient has received treatment. 2 -Major Osteoporotic Fracture: Clinical Spine, Forearm, Hip or Shoulder *FRAX is a Materials engineer of the [employer] for Metabolic Bone Disease, a [place] (WHO) [facility]. ASSESSMENT: The probability of a major osteoporotic fracture is 14.8% within the next ten years. The probability of a hip fracture is 1.6% within the next ten years. . Dear Dr.  Yisroel Ramming, Your patient Nalleli Largent completed a BMD test on 07/19/2017 using the [place] (analysis version: 14.10) manufactured by [employer]. The following summarizes the results of our evaluation. PATIENT BIOGRAPHICAL: Name: Houston, Surges Patient ID: [id number] Birth Date: 1955-06-06 Height: 63.0 in. Gender: Female Exam Date: 07/19/2017 Weight: 151.0 lbs. Indications: Asthma, Caucasian, Height Loss, High Risk Meds, History of Fracture (Adult), History of Spinal Surgery, Postmenopausal, Vitamin D Deficiency Fractures: Right foot Treatments: calcium w/ vit D, Estradiol, Multi-Vitamin, Progesterone, Vitamin D ASSESSMENT: The BMD measured at Femur Neck Left is 0.820 g/cm2 with a T-score of -1.6. This patient is considered osteopenic according to [facility]) criteria. Site Region Measured Measured WHO Young Adult BMD Date       Age      Classification T-score AP Spine L1-L4 07/19/2017 63.1 Normal 0.1 1.207 g/cm2 DualFemur Neck Left 07/19/2017 63.1 Osteopenia -1.6 0.820 g/cm2 World Health Organization [facility]) criteria for post-menopausal, Caucasian Women: Normal:       T-score at or above -1 SD Osteopenia:   T-score between -1 and -2.5 SD Osteoporosis: T-score at or below -2.5 SD RECOMMENDATIONS: 1. All patients should optimize calcium and vitamin D intake. 2. Consider FDA-approved medical therapies in postmenopausal women and men aged 43 years and older, based on the  following: a. A hip or vertebral(clinical or morphometric) fracture b. T-score < -2.5 at the femoral neck or spine  after appropriate evaluation to exclude secondary causes c. Low bone mass (T-score between -1.0 and -2.5 at the femoral neck or spine) and a 10-year probability of a hip fracture > 3% or a 10-year probability of a major osteoporosis-related fracture > 20% based on the [place]-adapted WHO algorithm d. Clinician judgment and/or patient preferences may indicate treatment for people with 10-year fracture probabilities above or below these levels FOLLOW-UP: People with diagnosed cases of osteoporosis or at high risk for fracture should have regular bone mineral density tests. For patients eligible for Medicare, routine testing is allowed once every 2 years. The testing frequency can be increased to one year for patients who have rapidly progressing disease, those who are receiving or discontinuing medical therapy to restore bone mass, or have additional risk factors. I have reviewed this report, and agree with the above findings. [facility] Electronically Signed   By: Lowella Grip III M.D.   On: 07/19/2017 11:26   Mm 3d Screen Breast Bilateral  Result Date: 07/19/2017 CLINICAL DATA:  Screening. EXAM: DIGITAL SCREENING BILATERAL MAMMOGRAM WITH TOMO AND CAD COMPARISON:  Previous exam(s). ACR Breast Density Category b: There are scattered areas of fibroglandular density. FINDINGS: There are no findings suspicious for malignancy. Images were processed with CAD. IMPRESSION: No mammographic evidence of malignancy. A result letter of this screening mammogram will be mailed directly to the patient. RECOMMENDATION: Screening mammogram in one year. (Code:SM-B-01Y) BI-RADS CATEGORY  1: Negative. Electronically Signed   By: Claudie Revering M.D.   On: 07/19/2017 13:50    Assessment & Plan:   Problem List Items Addressed This Visit    Mild persistent asthma with allergic rhinitis without complication     She is not wheezing on exam.  Chest  X ray is normal  Unclear if symptoms are due to VCD,  pulmonary hypertension, or deconditioning.  PE unlikely as there are no risk factors and she is not hypoxic or tachycardic. She will follow up with Dr Rockey Situ for a repeat ECHO      Relevant Medications   predniSONE (DELTASONE) 10 MG tablet   Chest pain in adult    Her pain is atypical and constant,  Occurring at rest. Cardiac enzymes and ekg are normal/unchanged at 8 hours (+) after onset of pain . Liver enzymes are normal,  But since evaluation  her pain location has shifted from central to left sided.         Other Visit Diagnoses    Other chest pain    -  Primary   Relevant Orders   EKG 12-Lead (Completed)   Troponin I - (Completed)   CK total and CKMB (cardiac)not at [facility] (Completed)   Comprehensive metabolic panel (Completed)   D-Dimer, Quantitative (Completed)   US Abdomen Limited RUQ   Orthopnea       Relevant Orders   B Nat Peptide (Completed)      I am having Hassie Bruce start on furosemide. I am also having her maintain her Azelastine-Fluticasone, EpiPen 2-Pak, Calcium Carbonate (CALTRATE 600 PO), multivitamin, PRESCRIPTION MEDICATION, Biotin, famotidine, montelukast, vitamin C, calcium citrate-vitamin D, cholecalciferol, budesonide, estradiol, buPROPion, Cyanocobalamin, fluticasone-salmeterol, amLODipine, cyanocobalamin, progesterone, telmisartan, albuterol, predniSONE, and valACYclovir. We will continue to administer ipratropium-albuterol.  Meds ordered this encounter  Medications  . furosemide (LASIX) 20 MG tablet    Sig: Take 1 tablet (20 mg total) by mouth daily. As needed for fluid retention    Dispense:  30 tablet    Refill:  0  A  total of 40 minutes was spent with patient more than half of which was spent in counseling patient on the above mentioned issues , reviewing and explaining recent labs and imaging studies done, and coordination of care.   There are no  discontinued medications.  Follow-up: No follow-ups on file.   Crecencio Mc, MD

## 2018-05-09 NOTE — Progress Notes (Signed)
Chest x ray ordered.  Can do prior to visit

## 2018-05-09 NOTE — Patient Instructions (Signed)
Your EKG and lung exam is normal.  I AM SENDING furosemide to your pharmacy to use as needed for fluid retention (do not start until the others tests today have been resulted and conveyed to you)  If the D Dimer is abnormal  A chest CT will be ordered

## 2018-05-10 DIAGNOSIS — R079 Chest pain, unspecified: Secondary | ICD-10-CM | POA: Insufficient documentation

## 2018-05-10 NOTE — Assessment & Plan Note (Signed)
Her pain is atypical and constant,  Occurring at rest. Cardiac enzymes and ekg are normal/unchanged at 8 hours (+) after onset of pain . Liver enzymes are normal,  But since evaluation  her pain location has shifted from central to left sided.

## 2018-05-10 NOTE — Assessment & Plan Note (Signed)
She is not wheezing on exam.  Chest  X ray is normal  Unclear if symptoms are due to VCD,  pulmonary hypertension, or deconditioning.  PE unlikely as there are no risk factors and she is not hypoxic or tachycardic. She will follow up with Dr Rockey Situ for a repeat ECHO

## 2018-05-14 ENCOUNTER — Other Ambulatory Visit: Payer: Self-pay | Admitting: *Deleted

## 2018-05-14 DIAGNOSIS — I272 Pulmonary hypertension, unspecified: Secondary | ICD-10-CM

## 2018-06-05 ENCOUNTER — Other Ambulatory Visit: Payer: Self-pay | Admitting: Internal Medicine

## 2018-06-05 DIAGNOSIS — Z1231 Encounter for screening mammogram for malignant neoplasm of breast: Secondary | ICD-10-CM

## 2018-06-07 DIAGNOSIS — J301 Allergic rhinitis due to pollen: Secondary | ICD-10-CM | POA: Diagnosis not present

## 2018-06-08 ENCOUNTER — Ambulatory Visit
Admission: RE | Admit: 2018-06-08 | Discharge: 2018-06-08 | Disposition: A | Discharge status: Home / Self Care | Payer: BLUE CROSS/BLUE SHIELD | Source: Ambulatory Visit | Attending: Internal Medicine | Admitting: Internal Medicine

## 2018-06-08 ENCOUNTER — Other Ambulatory Visit: Payer: Self-pay

## 2018-06-08 DIAGNOSIS — R0789 Other chest pain: Secondary | ICD-10-CM

## 2018-06-08 DIAGNOSIS — R1013 Epigastric pain: Secondary | ICD-10-CM | POA: Diagnosis not present

## 2018-06-12 DIAGNOSIS — J301 Allergic rhinitis due to pollen: Secondary | ICD-10-CM | POA: Diagnosis not present

## 2018-06-12 DIAGNOSIS — H43811 Vitreous degeneration, right eye: Secondary | ICD-10-CM | POA: Diagnosis not present

## 2018-06-14 DIAGNOSIS — J301 Allergic rhinitis due to pollen: Secondary | ICD-10-CM | POA: Diagnosis not present

## 2018-06-21 DIAGNOSIS — J301 Allergic rhinitis due to pollen: Secondary | ICD-10-CM | POA: Diagnosis not present

## 2018-06-25 ENCOUNTER — Encounter: Payer: BLUE CROSS/BLUE SHIELD | Admitting: Internal Medicine

## 2018-06-28 DIAGNOSIS — J301 Allergic rhinitis due to pollen: Secondary | ICD-10-CM | POA: Diagnosis not present

## 2018-06-28 DIAGNOSIS — C44519 Basal cell carcinoma of skin of other part of trunk: Secondary | ICD-10-CM | POA: Diagnosis not present

## 2018-07-03 ENCOUNTER — Ambulatory Visit (INDEPENDENT_AMBULATORY_CARE_PROVIDER_SITE_OTHER): Payer: BLUE CROSS/BLUE SHIELD

## 2018-07-03 ENCOUNTER — Other Ambulatory Visit: Payer: Self-pay

## 2018-07-03 DIAGNOSIS — I272 Pulmonary hypertension, unspecified: Secondary | ICD-10-CM

## 2018-07-05 DIAGNOSIS — J301 Allergic rhinitis due to pollen: Secondary | ICD-10-CM | POA: Diagnosis not present

## 2018-07-12 DIAGNOSIS — J301 Allergic rhinitis due to pollen: Secondary | ICD-10-CM | POA: Diagnosis not present

## 2018-07-16 ENCOUNTER — Telehealth: Payer: Self-pay | Admitting: *Deleted

## 2018-07-16 NOTE — Telephone Encounter (Signed)
Copied from [place] [id number]. Topic: Quick Communication - Appointment Cancellation >> Jul 16, 2018  9:13 AM Celene Kras A wrote: Attempted to contact office. Patient called to cancel appointment scheduled for 07/20/2018. Patient has not rescheduled their appointment. Pt would like to reschedule her appt. Pt states her schedule is wide open. Please advise.   Route to department's PEC pool.

## 2018-07-19 DIAGNOSIS — J301 Allergic rhinitis due to pollen: Secondary | ICD-10-CM | POA: Diagnosis not present

## 2018-07-20 ENCOUNTER — Encounter: Payer: BLUE CROSS/BLUE SHIELD | Admitting: Internal Medicine

## 2018-07-21 ENCOUNTER — Other Ambulatory Visit: Payer: Self-pay | Admitting: Internal Medicine

## 2018-07-26 DIAGNOSIS — J301 Allergic rhinitis due to pollen: Secondary | ICD-10-CM | POA: Diagnosis not present

## 2018-08-02 DIAGNOSIS — J301 Allergic rhinitis due to pollen: Secondary | ICD-10-CM | POA: Diagnosis not present

## 2018-08-09 ENCOUNTER — Ambulatory Visit
Admission: RE | Admit: 2018-08-09 | Discharge: 2018-08-09 | Disposition: A | Discharge status: Home / Self Care | Payer: BC Managed Care – PPO | Source: Ambulatory Visit | Attending: Internal Medicine | Admitting: Internal Medicine

## 2018-08-09 ENCOUNTER — Other Ambulatory Visit: Payer: Self-pay

## 2018-08-09 DIAGNOSIS — Z1231 Encounter for screening mammogram for malignant neoplasm of breast: Secondary | ICD-10-CM | POA: Diagnosis not present

## 2018-08-09 DIAGNOSIS — J301 Allergic rhinitis due to pollen: Secondary | ICD-10-CM | POA: Diagnosis not present

## 2018-08-15 DIAGNOSIS — H43811 Vitreous degeneration, right eye: Secondary | ICD-10-CM | POA: Diagnosis not present

## 2018-08-22 ENCOUNTER — Telehealth: Payer: Self-pay | Admitting: Internal Medicine

## 2018-08-22 NOTE — Telephone Encounter (Signed)
Patient has had a [place] TO BE SCHEDULED FOR TESTING AT [facility]

## 2018-08-23 ENCOUNTER — Telehealth: Payer: Self-pay | Admitting: Internal Medicine

## 2018-08-23 DIAGNOSIS — Z20822 Contact with and (suspected) exposure to covid-19: Secondary | ICD-10-CM

## 2018-08-23 NOTE — Telephone Encounter (Signed)
Test was ordered last night by Dr. Derrel Nip.

## 2018-08-23 NOTE — Telephone Encounter (Signed)
Pt. Reports Dr. Derrel Nip ordered COVID 19 test for symptoms. Scheduled for tomorrow.

## 2018-08-24 ENCOUNTER — Other Ambulatory Visit: Payer: BC Managed Care – PPO

## 2018-08-24 DIAGNOSIS — R6889 Other general symptoms and signs: Secondary | ICD-10-CM | POA: Diagnosis not present

## 2018-08-27 ENCOUNTER — Other Ambulatory Visit: Payer: Self-pay | Admitting: Internal Medicine

## 2018-08-27 DIAGNOSIS — Z780 Asymptomatic menopausal state: Secondary | ICD-10-CM

## 2018-08-29 ENCOUNTER — Other Ambulatory Visit: Payer: Self-pay

## 2018-08-29 ENCOUNTER — Ambulatory Visit (INDEPENDENT_AMBULATORY_CARE_PROVIDER_SITE_OTHER): Payer: BC Managed Care – PPO | Admitting: Internal Medicine

## 2018-08-29 ENCOUNTER — Encounter: Payer: Self-pay | Admitting: Internal Medicine

## 2018-08-29 DIAGNOSIS — Z20828 Contact with and (suspected) exposure to other viral communicable diseases: Secondary | ICD-10-CM | POA: Diagnosis not present

## 2018-08-29 DIAGNOSIS — Z20822 Contact with and (suspected) exposure to covid-19: Secondary | ICD-10-CM

## 2018-08-29 NOTE — Progress Notes (Signed)
Virtual Visit via [email or web address]  This visit type was conducted due to national recommendations for restrictions regarding the COVID-19 pandemic (e.g. social distancing).  This format is felt to be most appropriate for this patient at this time.  All issues noted in this document were discussed and addressed.  No physical exam was performed (except for noted visual exam findings with Video Visits).   I connected with@ on 08/29/18 at  3:00 PM EDT by a video enabled telemedicine application or telephone and verified that I am speaking with the correct person using two identifiers. Location patient: home Location provider: work or home office Persons participating in the virtual visit: patient, provider  I discussed the limitations, risks, security and privacy concerns of performing an evaluation and management service by telephone and the availability of in person appointments. I also discussed with the patient that there may be a patient responsible charge related to this service. The patient expressed understanding and agreed to proceed.  Reason for visit:  covid exposure jun 25th  Followed by several days of dry cough, chest tightness and "sniffles "  HPI:  63 yr old female with  History of asthma presents with covid exposure jun 25th  Followed by several days of dry cough, chest tightness and sniffles. No fever,s  Body aches,  Nausea or diarrhea, but has chronic fatigue requiring a nap daily in the afternoon.  Not exercising daily beyond a 20 minute walk. Not short of breath with walk. Has  HOME GYM but not using currently, husband convalescing from foot surgery ,  Requires assistance due to non weight baring status.   ROS: See pertinent positives and negatives per HPI.  Past Medical History:  Diagnosis Date  . Asthma   . Asthma in adult, mild intermittent, uncomplicated 1/61/0960   PFTs done at [facility] Nov 2015.  Raw data received w/o interpretation  FEV 1 1.77 FEV1/FVC 61.95 etc see scanned sheet    . Cervical disc herniation   . Facial paralysis/Bells palsy    6 week paralysis of right side of face   . Fibroid   . History of abnormal cervical Pap smear   . History of colon polyps    adenomatous  . Hypertension   . Migraine, menstrual    resolved with menopause  . Osteopenia 2019  . Raynaud phenomenon     Past Surgical History:  Procedure Laterality Date  . CERVICAL BIOPSY  W/ LOOP ELECTRODE EXCISION  1991  . CERVICAL CONE BIOPSY  1985  . CERVICAL DISC SURGERY  11/2012   --removed bone spurs, bulging disc  . COLPOSCOPY  1984   no treatment  . FINGER SURGERY Left 06/2014   Left index finger.   Marland Kitchen NASAL SINUS SURGERY      Family History  Problem Relation Age of Onset  . Hypertension Mother   . Stroke Mother 34       cerebral aneurysm  . Heart disease Father        CABG  . COPD Father 104  . Diabetes Father   . Heart failure Father   . Heart attack Father 25  . Breast cancer Maternal Grandmother 61       dec 89   . Diabetes Paternal Grandfather   . Heart attack Paternal Grandfather   . Heart disease Paternal Grandfather   . Heart failure Paternal Grandfather   . Heart disease Paternal Uncle   . Stroke Paternal Uncle     SOCIAL HX:  reports that she  has never smoked. She has never used smokeless tobacco. She reports that she does not drink alcohol or use drugs.   Current Outpatient Medications:  .  albuterol (PROVENTIL HFA;VENTOLIN HFA) 108 (90 Base) MCG/ACT inhaler, Inhale 2 puffs into the lungs every 6 (six) hours as needed., Disp: 18 g, Rfl: 3 .  amLODipine (NORVASC) 2.5 MG tablet, Take 1 tablet (2.5 mg total) by mouth daily as needed., Disp: 90 tablet, Rfl: 3 .  Ascorbic Acid (VITAMIN C) 100 MG tablet, Take 100 mg by mouth daily., Disp: , Rfl:  .  Azelastine-Fluticasone (DYMISTA) 137-50 MCG/ACT SUSP, Place 1 spray into both nostrils daily., Disp: , Rfl:  .  Biotin 2500 MCG CAPS, Take 1 capsule by mouth daily., Disp: , Rfl:  .  budesonide (PULMICORT) 0.5  MG/2ML nebulizer solution, , Disp: , Rfl: 10 .  Calcium Carbonate (CALTRATE 600 PO), Take 1 tablet by mouth daily., Disp: , Rfl:  .  calcium citrate-vitamin D (CITRACAL+D) 315-200 MG-UNIT tablet, Take by mouth., Disp: , Rfl:  .  cholecalciferol (VITAMIN D) 1000 units tablet, Take 1,000 Units by mouth daily., Disp: , Rfl:  .  Cyanocobalamin 1000 MCG SUBL, Place 1 tablet (1,000 mcg total) under the tongue daily., Disp: 180 tablet, Rfl: 1 .  DOTTI 0.025 MG/24HR, APPLY ONE PATCH TWICE WEEKLY, Disp: 8 patch, Rfl: 1 .  EPIPEN 2-PAK 0.3 MG/0.3ML SOAJ, Reported on 07/15/2015, Disp: , Rfl:  .  fluticasone-salmeterol (ADVAIR HFA) 115-21 MCG/ACT inhaler, Inhale 2 puffs into the lungs 2 (two) times daily., Disp: 1 Inhaler, Rfl: 12 .  furosemide (LASIX) 20 MG tablet, Take 1 tablet (20 mg total) by mouth daily. As needed for fluid retention, Disp: 30 tablet, Rfl: 0 .  montelukast (SINGULAIR) 10 MG tablet, , Disp: , Rfl: 10 .  Multiple Vitamin (MULTIVITAMIN) capsule, Take 1 capsule by mouth daily., Disp: , Rfl:  .  PRESCRIPTION MEDICATION, Allergy Shots weekly, Disp: , Rfl:  .  progesterone (PROMETRIUM) 100 MG capsule, TAKE 1 CAPSULE BY MOUTH ONCE DAILY, Disp: 90 capsule, Rfl: 1 .  telmisartan (MICARDIS) 40 MG tablet, TAKE ONE TABLET BY MOUTH EVERY DAY, Disp: 90 tablet, Rfl: 3 .  valACYclovir (VALTREX) 1000 MG tablet, , Disp: , Rfl:  .  buPROPion (WELLBUTRIN XL) 150 MG 24 hr tablet, Take 1 tablet (150 mg total) by mouth daily. With breakfast (Patient not taking: Reported on 08/29/2018), Disp: 30 tablet, Rfl: 2  Current Facility-Administered Medications:  .  ipratropium-albuterol (DUONEB) 0.5-2.5 (3) MG/3ML nebulizer solution 3 mL, 3 mL, Nebulization, Once, Derrel Nip, Aris Everts, MD  EXAM:  VITALS per patient if applicable:  GENERAL: alert, oriented, appears well and in no acute distress  HEENT: atraumatic, conjunttiva clear, no obvious abnormalities on inspection of external nose and ears  NECK: normal  movements of the head and neck  LUNGS: on inspection no signs of respiratory distress, breathing rate appears normal, no obvious gross SOB, gasping or wheezing  CV: no obvious cyanosis  MS: moves all visible extremities without noticeable abnormality  PSYCH/NEURO: pleasant and cooperative, no obvious depression or anxiety, speech and thought processing grossly intact  ASSESSMENT AND PLAN:  Discussed the following assessment and plan:  Close Exposure to Covid-19 Virus She had brief unmasked contact with an elderly female who subsequently tested positive .   COVID 19 TESTING WAS NEGATIVE.  Educated patient on the newly broadened list of signs and symptoms of COVID-19 infection and ways to avoid the viral infection including washing hands frequently with soap and  water,  using hand sanitizer if unable to wash, avoiding touching face,  staying at home and limiting visitors,  and avoiding contact with people coming in and out of home.  Discussed the potential ineffectiveness of hand sanitizer if left in environments > 110 degrees (ie , the car).  Reminded patient to call office with questions/concerns.  The importance of continued social distancing was discussed today . Patient was screened for the development of any unsafe behaviors or habits that may have developed as a result of the social impact of the virus , including alcohol abuse,  Domestic violence, tobacco abuse and overeating.       I discussed the assessment and treatment plan with the patient. The patient was provided an opportunity to ask questions and all were answered. The patient agreed with the plan and demonstrated an understanding of the instructions.   The patient was advised to call back or seek an in-person evaluation if the symptoms worsen or if the condition fails to improve as anticipated.  I provided 25 minutes of non-face-to-face time during this encounter.   Crecencio Mc, MD

## 2018-08-30 LAB — NOVEL CORONAVIRUS, NAA: SARS-CoV-2, NAA: NOT DETECTED

## 2018-08-31 DIAGNOSIS — J301 Allergic rhinitis due to pollen: Secondary | ICD-10-CM | POA: Diagnosis not present

## 2018-09-01 DIAGNOSIS — Z20822 Contact with and (suspected) exposure to covid-19: Secondary | ICD-10-CM | POA: Insufficient documentation

## 2018-09-01 DIAGNOSIS — Z20828 Contact with and (suspected) exposure to other viral communicable diseases: Secondary | ICD-10-CM | POA: Insufficient documentation

## 2018-09-01 NOTE — Assessment & Plan Note (Signed)
She had brief unmasked contact with an elderly female who subsequently tested positive .   COVID 19 TESTING WAS NEGATIVE.  Educated patient on the newly broadened list of signs and symptoms of COVID-19 infection and ways to avoid the viral infection including washing hands frequently with soap and water,  using hand sanitizer if unable to wash, avoiding touching face,  staying at home and limiting visitors,  and avoiding contact with people coming in and out of home.  Discussed the potential ineffectiveness of hand sanitizer if left in environments > 110 degrees (ie , the car).  Reminded patient to call office with questions/concerns.  The importance of continued social distancing was discussed today . Patient was screened for the development of any unsafe behaviors or habits that may have developed as a result of the social impact of the virus , including alcohol abuse,  Domestic violence, tobacco abuse and overeating.

## 2018-09-10 ENCOUNTER — Other Ambulatory Visit: Payer: Self-pay | Admitting: Internal Medicine

## 2018-09-13 DIAGNOSIS — J301 Allergic rhinitis due to pollen: Secondary | ICD-10-CM | POA: Diagnosis not present

## 2018-09-27 DIAGNOSIS — J301 Allergic rhinitis due to pollen: Secondary | ICD-10-CM | POA: Diagnosis not present

## 2018-10-11 DIAGNOSIS — J301 Allergic rhinitis due to pollen: Secondary | ICD-10-CM | POA: Diagnosis not present

## 2018-10-25 DIAGNOSIS — J301 Allergic rhinitis due to pollen: Secondary | ICD-10-CM | POA: Diagnosis not present

## 2018-11-08 DIAGNOSIS — J301 Allergic rhinitis due to pollen: Secondary | ICD-10-CM | POA: Diagnosis not present

## 2018-11-15 DIAGNOSIS — J301 Allergic rhinitis due to pollen: Secondary | ICD-10-CM | POA: Diagnosis not present

## 2018-11-27 DIAGNOSIS — J301 Allergic rhinitis due to pollen: Secondary | ICD-10-CM | POA: Diagnosis not present

## 2018-12-06 DIAGNOSIS — J301 Allergic rhinitis due to pollen: Secondary | ICD-10-CM | POA: Diagnosis not present

## 2018-12-10 DIAGNOSIS — J309 Allergic rhinitis, unspecified: Secondary | ICD-10-CM | POA: Diagnosis not present

## 2019-02-04 DIAGNOSIS — S73191A Other sprain of right hip, initial encounter: Secondary | ICD-10-CM | POA: Diagnosis not present

## 2019-02-04 DIAGNOSIS — M75101 Unspecified rotator cuff tear or rupture of right shoulder, not specified as traumatic: Secondary | ICD-10-CM | POA: Diagnosis not present

## 2019-02-04 DIAGNOSIS — M25551 Pain in right hip: Secondary | ICD-10-CM | POA: Diagnosis not present

## 2019-02-21 DIAGNOSIS — J301 Allergic rhinitis due to pollen: Secondary | ICD-10-CM | POA: Diagnosis not present

## 2019-02-25 ENCOUNTER — Other Ambulatory Visit: Payer: Self-pay | Admitting: Internal Medicine

## 2019-02-25 DIAGNOSIS — N950 Postmenopausal bleeding: Secondary | ICD-10-CM

## 2019-02-25 NOTE — Assessment & Plan Note (Addendum)
Patient has developed moderate vaginal bleeding while on HRT (estrogen patch and progesterone oral ).  She has a history of cervical dysplasia and remote history of LEEP procedure.   Advised her to stop HRT.  She has requested referral to [place].  Urgent Referral made

## 2019-02-28 DIAGNOSIS — J301 Allergic rhinitis due to pollen: Secondary | ICD-10-CM | POA: Diagnosis not present

## 2019-03-04 ENCOUNTER — Other Ambulatory Visit: Payer: Self-pay | Admitting: Internal Medicine

## 2019-03-07 DIAGNOSIS — J301 Allergic rhinitis due to pollen: Secondary | ICD-10-CM | POA: Diagnosis not present

## 2019-03-14 DIAGNOSIS — J301 Allergic rhinitis due to pollen: Secondary | ICD-10-CM | POA: Diagnosis not present

## 2019-03-18 DIAGNOSIS — Z01419 Encounter for gynecological examination (general) (routine) without abnormal findings: Secondary | ICD-10-CM | POA: Diagnosis not present

## 2019-03-18 DIAGNOSIS — Z1151 Encounter for screening for human papillomavirus (HPV): Secondary | ICD-10-CM | POA: Diagnosis not present

## 2019-03-18 DIAGNOSIS — Z124 Encounter for screening for malignant neoplasm of cervix: Secondary | ICD-10-CM | POA: Diagnosis not present

## 2019-03-18 DIAGNOSIS — N950 Postmenopausal bleeding: Secondary | ICD-10-CM | POA: Diagnosis not present

## 2019-03-21 DIAGNOSIS — J301 Allergic rhinitis due to pollen: Secondary | ICD-10-CM | POA: Diagnosis not present

## 2019-03-22 DIAGNOSIS — N950 Postmenopausal bleeding: Secondary | ICD-10-CM | POA: Diagnosis not present

## 2019-03-28 DIAGNOSIS — J301 Allergic rhinitis due to pollen: Secondary | ICD-10-CM | POA: Diagnosis not present

## 2019-04-05 DIAGNOSIS — J301 Allergic rhinitis due to pollen: Secondary | ICD-10-CM | POA: Diagnosis not present

## 2019-04-11 DIAGNOSIS — J301 Allergic rhinitis due to pollen: Secondary | ICD-10-CM | POA: Diagnosis not present

## 2019-04-16 DIAGNOSIS — Z23 Encounter for immunization: Secondary | ICD-10-CM | POA: Diagnosis not present

## 2019-05-02 DIAGNOSIS — J301 Allergic rhinitis due to pollen: Secondary | ICD-10-CM | POA: Diagnosis not present

## 2019-05-05 DIAGNOSIS — Z23 Encounter for immunization: Secondary | ICD-10-CM | POA: Diagnosis not present

## 2019-05-14 DIAGNOSIS — J301 Allergic rhinitis due to pollen: Secondary | ICD-10-CM | POA: Diagnosis not present

## 2019-05-16 DIAGNOSIS — J301 Allergic rhinitis due to pollen: Secondary | ICD-10-CM | POA: Diagnosis not present

## 2019-05-23 DIAGNOSIS — J301 Allergic rhinitis due to pollen: Secondary | ICD-10-CM | POA: Diagnosis not present

## 2019-05-30 DIAGNOSIS — J301 Allergic rhinitis due to pollen: Secondary | ICD-10-CM | POA: Diagnosis not present

## 2019-06-06 DIAGNOSIS — J301 Allergic rhinitis due to pollen: Secondary | ICD-10-CM | POA: Diagnosis not present

## 2019-06-07 ENCOUNTER — Other Ambulatory Visit: Payer: Self-pay | Admitting: Internal Medicine

## 2019-06-07 DIAGNOSIS — D225 Melanocytic nevi of trunk: Secondary | ICD-10-CM | POA: Diagnosis not present

## 2019-06-07 DIAGNOSIS — L853 Xerosis cutis: Secondary | ICD-10-CM | POA: Diagnosis not present

## 2019-06-07 DIAGNOSIS — L718 Other rosacea: Secondary | ICD-10-CM | POA: Diagnosis not present

## 2019-06-07 DIAGNOSIS — Z780 Asymptomatic menopausal state: Secondary | ICD-10-CM

## 2019-06-07 DIAGNOSIS — Z85828 Personal history of other malignant neoplasm of skin: Secondary | ICD-10-CM | POA: Diagnosis not present

## 2019-06-13 DIAGNOSIS — J301 Allergic rhinitis due to pollen: Secondary | ICD-10-CM | POA: Diagnosis not present

## 2019-06-20 DIAGNOSIS — J301 Allergic rhinitis due to pollen: Secondary | ICD-10-CM | POA: Diagnosis not present

## 2019-06-27 DIAGNOSIS — J301 Allergic rhinitis due to pollen: Secondary | ICD-10-CM | POA: Diagnosis not present

## 2019-07-04 DIAGNOSIS — J301 Allergic rhinitis due to pollen: Secondary | ICD-10-CM | POA: Diagnosis not present

## 2019-07-11 ENCOUNTER — Other Ambulatory Visit: Payer: Self-pay | Admitting: Internal Medicine

## 2019-07-11 DIAGNOSIS — M25561 Pain in right knee: Secondary | ICD-10-CM | POA: Insufficient documentation

## 2019-07-11 DIAGNOSIS — G8929 Other chronic pain: Secondary | ICD-10-CM

## 2019-07-11 DIAGNOSIS — M25551 Pain in right hip: Secondary | ICD-10-CM | POA: Insufficient documentation

## 2019-07-11 NOTE — Assessment & Plan Note (Addendum)
Referral to PT for therapeutic exercise program . Pricilla Riffle, PT, DPT [facility]   Ph [phone]   Fax [phone]

## 2019-07-11 NOTE — Assessment & Plan Note (Signed)
Her OA of knees and hips is interfering with her ability to exercise regularly.  She would like to meet with a physical therapist to outline a plan for therapeutic exercise. Referral to Pricilla Riffle at [facility] recommended.

## 2019-07-16 ENCOUNTER — Other Ambulatory Visit: Payer: Self-pay | Admitting: Internal Medicine

## 2019-07-16 DIAGNOSIS — Z1231 Encounter for screening mammogram for malignant neoplasm of breast: Secondary | ICD-10-CM

## 2019-07-18 DIAGNOSIS — J301 Allergic rhinitis due to pollen: Secondary | ICD-10-CM | POA: Diagnosis not present

## 2019-07-23 ENCOUNTER — Other Ambulatory Visit: Payer: Self-pay | Admitting: Internal Medicine

## 2019-07-23 DIAGNOSIS — Z780 Asymptomatic menopausal state: Secondary | ICD-10-CM

## 2019-07-25 DIAGNOSIS — J301 Allergic rhinitis due to pollen: Secondary | ICD-10-CM | POA: Diagnosis not present

## 2019-07-26 ENCOUNTER — Other Ambulatory Visit: Payer: Self-pay | Admitting: Internal Medicine

## 2019-07-26 DIAGNOSIS — Z780 Asymptomatic menopausal state: Secondary | ICD-10-CM

## 2019-07-26 MED ORDER — PROGESTERONE MICRONIZED 100 MG PO CAPS: ORAL_CAPSULE | ORAL | 2 refills | Status: DC

## 2019-08-01 DIAGNOSIS — J301 Allergic rhinitis due to pollen: Secondary | ICD-10-CM | POA: Diagnosis not present

## 2019-08-02 ENCOUNTER — Encounter: Payer: Self-pay | Admitting: Internal Medicine

## 2019-08-02 ENCOUNTER — Ambulatory Visit (INDEPENDENT_AMBULATORY_CARE_PROVIDER_SITE_OTHER): Payer: BC Managed Care – PPO | Admitting: Internal Medicine

## 2019-08-02 ENCOUNTER — Other Ambulatory Visit: Payer: Self-pay

## 2019-08-02 VITALS — BP 140/94 | HR 82 | Temp 97.4°F | Resp 13 | Ht 64.0 in | Wt 151.0 lb

## 2019-08-02 DIAGNOSIS — R5383 Other fatigue: Secondary | ICD-10-CM

## 2019-08-02 DIAGNOSIS — D751 Secondary polycythemia: Secondary | ICD-10-CM

## 2019-08-02 DIAGNOSIS — I10 Essential (primary) hypertension: Secondary | ICD-10-CM | POA: Diagnosis not present

## 2019-08-02 DIAGNOSIS — E559 Vitamin D deficiency, unspecified: Secondary | ICD-10-CM

## 2019-08-02 DIAGNOSIS — Z0000 Encounter for general adult medical examination without abnormal findings: Secondary | ICD-10-CM

## 2019-08-02 DIAGNOSIS — G43829 Menstrual migraine, not intractable, without status migrainosus: Secondary | ICD-10-CM

## 2019-08-02 DIAGNOSIS — E538 Deficiency of other specified B group vitamins: Secondary | ICD-10-CM

## 2019-08-02 MED ORDER — ONDANSETRON 4 MG PO TBDP: 4.0000 mg | ORAL_TABLET | Freq: Three times a day (TID) | ORAL | 0 refills | Status: DC | PRN

## 2019-08-02 NOTE — Patient Instructions (Signed)
I SENT AN RX TO ZOFRAN for nausea to [facility]  Let me know if you want to try fioricet for migraines (butalbital , caffeine and tylenol)    Fasting labs hold biotin for 72 hours piror to lab  draw))

## 2019-08-02 NOTE — Progress Notes (Signed)
Patient ID: Jean Luna, female    DOB: 11-Oct-1955  Age: 64 y.o. MRN: [id number]  The patient is here for annual preventive examination and management of other chronic and acute problems.   The risk factors are reflected in the social history.  The roster of all physicians providing medical care to patient - is listed in the Snapshot section of the chart.  Activities of daily living:  The patient is 100% independent in all ADLs: dressing, toileting, feeding as well as independent mobility  Home safety : The patient has smoke detectors in the home. They wear seatbelts.  There are no firearms at home. There is no violence in the home.   There is no risks for hepatitis, STDs or HIV. There is no   history of blood transfusion. They have no travel history to infectious disease endemic areas of the world.  The patient has seen their dentist in the last six month. They have seen their eye doctor in the last year. She denies  hearing difficulty with regard to whispered voices and some television programs.  They have deferred audiologic testing in the last year.  They do not  have excessive sun exposure. Discussed the need for sun protection: hats, long sleeves and use of sunscreen if there is significant sun exposure.   Diet: the importance of a healthy diet is discussed. They do have a healthy diet.  The benefits of regular aerobic exercise were discussed. She walks 4 times per week ,  20 minutes.   Depression screen: there are no signs or vegative symptoms of depression- irritability, change in appetite, anhedonia, sadness/tearfullness.  Cognitive assessment: the patient manages all their financial and personal affairs and is actively engaged. They could relate day,date,year and events; recalled 2/3 objects at 3 minutes; performed clock-face test normally.  The following portions of the patient's history were reviewed and updated as appropriate: allergies, current medications, past family  history, past medical history,  past surgical history, past social history  and problem list.  Visual acuity was not assessed per patient preference since she has regular follow up with her ophthalmologist. Hearing and body mass index were assessed and reviewed.   During the course of the visit the patient was educated and counseled about appropriate screening and preventive services including : fall prevention , diabetes screening, nutrition counseling, colorectal cancer screening, and recommended immunizations.    CC: The primary encounter diagnosis was Fatigue, unspecified type. Diagnoses of Erythrocytosis, Essential hypertension, Vitamin B12 deficiency without anemia, Vitamin D deficiency, Routine general medical examination at a health care facility, and Menstrual migraine without status migrainosus, not intractable were also pertinent to this visit.  History Samar has a past medical history of Asthma, Asthma in adult, mild intermittent, uncomplicated (0/04/4915), Cervical disc herniation, Facial paralysis/Bells palsy, Fibroid, History of abnormal cervical Pap smear, History of colon polyps, Hypertension, Migraine, menstrual, Osteopenia (2019), and Raynaud phenomenon.   She has a past surgical history that includes Nasal sinus surgery; Cervical cone biopsy (1985); Cervical biopsy w/ loop electrode excision (1991); Cervical disc surgery (11/2012); Colposcopy (1984); and Finger surgery (Left, 06/2014).   Her family history includes Breast cancer (age of onset: 71) in her maternal grandmother; COPD (age of onset: 49) in her father; Diabetes in her father and paternal grandfather; Heart attack in her paternal grandfather; Heart attack (age of onset: 70) in her father; Heart disease in her father, paternal grandfather, and paternal uncle; Heart failure in her father and paternal grandfather; Hypertension in her  mother; Stroke in her paternal uncle; Stroke (age of onset: 17) in her mother.She reports that  she has never smoked. She has never used smokeless tobacco. She reports that she does not drink alcohol and does not use drugs.  Outpatient Medications Prior to Visit  Medication Sig Dispense Refill  . ADVAIR HFA 115-21 MCG/ACT inhaler TAKE 2 PUFFS INTO LUNGS TWICE DAILY 12 g 0  . albuterol (PROVENTIL HFA;VENTOLIN HFA) 108 (90 Base) MCG/ACT inhaler Inhale 2 puffs into the lungs every 6 (six) hours as needed. 18 g 3  . amLODipine (NORVASC) 2.5 MG tablet Take 1 tablet (2.5 mg total) by mouth daily as needed. 90 tablet 3  . Ascorbic Acid (VITAMIN C) 100 MG tablet Take 100 mg by mouth daily.    . Azelastine-Fluticasone (DYMISTA) 137-50 MCG/ACT SUSP Place 1 spray into both nostrils daily.    . Biotin 2500 MCG CAPS Take 1 capsule by mouth daily.    . budesonide (PULMICORT) 0.5 MG/2ML nebulizer solution   10  . buPROPion (WELLBUTRIN XL) 150 MG 24 hr tablet Take 1 tablet (150 mg total) by mouth daily. With breakfast 30 tablet 2  . Calcium Carbonate (CALTRATE 600 PO) Take 1 tablet by mouth daily.    . calcium citrate-vitamin D (CITRACAL+D) 315-200 MG-UNIT tablet Take by mouth.    . cholecalciferol (VITAMIN D) 1000 units tablet Take 1,000 Units by mouth daily.    . Cyanocobalamin 1000 MCG SUBL Place 1 tablet (1,000 mcg total) under the tongue daily. 180 tablet 1  . EPIPEN 2-PAK 0.3 MG/0.3ML SOAJ Reported on 07/15/2015    . furosemide (LASIX) 20 MG tablet Take 1 tablet (20 mg total) by mouth daily. As needed for fluid retention 30 tablet 0  . montelukast (SINGULAIR) 10 MG tablet   10  . Multiple Vitamin (MULTIVITAMIN) capsule Take 1 capsule by mouth daily.    Marland Kitchen PRESCRIPTION MEDICATION Allergy Shots weekly    . progesterone (PROMETRIUM) 100 MG capsule TAKE 1 CAPSULE BY MOUTH EVERY DAY 90 capsule 2  . telmisartan (MICARDIS) 40 MG tablet TAKE 1 TABLET BY MOUTH DAILY 90 tablet 3  . valACYclovir (VALTREX) 1000 MG tablet     . DOTTI 0.025 MG/24HR APPLY 1 PATCH TWICE WEEKLY (Patient not taking: Reported on  08/02/2019) 8 patch 3   Facility-Administered Medications Prior to Visit  Medication Dose Route Frequency Provider Last Rate Last Admin  . ipratropium-albuterol (DUONEB) 0.5-2.5 (3) MG/3ML nebulizer solution 3 mL  3 mL Nebulization Once Crecencio Mc, MD        Review of Systems   Patient denies headache, fevers, malaise, unintentional weight loss, skin rash, eye pain, sinus congestion and sinus pain, sore throat, dysphagia,  hemoptysis , cough, dyspnea, wheezing, chest pain, palpitations, orthopnea, edema, abdominal pain, nausea, melena, diarrhea, constipation, flank pain, dysuria, hematuria, urinary  Frequency, nocturia, numbness, tingling, seizures,  Focal weakness, Loss of consciousness,  Tremor, insomnia, depression, anxiety, and suicidal ideation.      Objective:  BP (!) 140/94 (BP Location: Left Arm, Patient Position: Sitting, Cuff Size: Normal)   Pulse 82   Temp (!) 97.4 F (36.3 C) (Temporal)   Resp 13   Ht 5\' 4"  (1.626 m)   Wt 151 lb (68.5 kg)   LMP 02/15/2003   SpO2 99%   BMI 25.92 kg/m   Physical Exam  General appearance: alert, cooperative and appears stated age Head: Normocephalic, without obvious abnormality, atraumatic Eyes: conjunctivae/corneas clear. PERRL, EOM's intact. Fundi benign. Ears: normal TM's and external  ear canals both ears Nose: Nares normal. Septum midline. Mucosa normal. No drainage or sinus tenderness. Throat: lips, mucosa, and tongue normal; teeth and gums normal Neck: no adenopathy, no carotid bruit, no JVD, supple, symmetrical, trachea midline and thyroid not enlarged, symmetric, no tenderness/mass/nodules Lungs: clear to auscultation bilaterally Breasts: normal appearance, no masses or tenderness Heart: regular rate and rhythm, S1, S2 normal, no murmur, click, rub or gallop Abdomen: soft, non-tender; bowel sounds normal; no masses,  no organomegaly Extremities: extremities normal, atraumatic, no cyanosis or edema Pulses: 2+ and  symmetric Skin: Skin color, texture, turgor normal. No rashes or lesions Neurologic: Alert and oriented X 3, normal strength and tone. Normal symmetric reflexes. Normal coordination and gait.    Assessment & Plan:   Problem List Items Addressed This Visit      Unprioritized   Erythrocytosis   Relevant Orders   Iron, TIBC and Ferritin Panel   CBC with Differential/Platelet   Essential hypertension   Relevant Orders   Comprehensive metabolic panel   Lipid panel   Fatigue - Primary   Relevant Orders   TSH   Migraine headache    She has not had one in years but had one today that was preceded by  Scotoma.  Relieved with Excedrin.  rx zofran prn       Routine general medical examination at a health care facility    age appropriate education and counseling updated, referrals for preventative services and immunizations addressed, dietary and smoking counseling addressed, most recent labs reviewed.  I have personally reviewed and have noted:  1) the patient's medical and social history 2) The pt's use of alcohol, tobacco, and illicit drugs 3) The patient's current medications and supplements 4) Functional ability including ADL's, fall risk, home safety risk, hearing and visual impairment 5) Diet and physical activities 6) Evidence for depression or mood disorder 7) The patient's height, weight, and BMI have been recorded in the chart  I have made referrals, and provided counseling and education based on review of the above      Vitamin B12 deficiency without anemia   Relevant Orders   Vitamin B12   Vitamin D deficiency   Relevant Orders   VITAMIN D 25 Hydroxy (Vit-D Deficiency, Fractures)      I am having Hassie Bruce start on ondansetron. I am also having her maintain her Azelastine-Fluticasone, EpiPen 2-Pak, Calcium Carbonate (CALTRATE 600 PO), multivitamin, PRESCRIPTION MEDICATION, Biotin, montelukast, vitamin C, calcium citrate-vitamin D, cholecalciferol,  budesonide, buPROPion, Cyanocobalamin, amLODipine, albuterol, valACYclovir, furosemide, Dotti, telmisartan, Advair HFA, and progesterone. We will continue to administer ipratropium-albuterol.  Meds ordered this encounter  Medications  . ondansetron (ZOFRAN ODT) 4 MG disintegrating tablet    Sig: Take 1 tablet (4 mg total) by mouth every 8 (eight) hours as needed for nausea or vomiting.    Dispense:  20 tablet    Refill:  0    There are no discontinued medications.  Follow-up: No follow-ups on file.   Crecencio Mc, MD

## 2019-08-04 ENCOUNTER — Encounter: Payer: Self-pay | Admitting: Internal Medicine

## 2019-08-04 NOTE — Assessment & Plan Note (Signed)
She has not had one in years but had one today that was preceded by  Scotoma.  Relieved with Excedrin.  rx zofran prn

## 2019-08-04 NOTE — Assessment & Plan Note (Signed)

## 2019-08-08 DIAGNOSIS — J301 Allergic rhinitis due to pollen: Secondary | ICD-10-CM | POA: Diagnosis not present

## 2019-08-08 DIAGNOSIS — M25551 Pain in right hip: Secondary | ICD-10-CM | POA: Diagnosis not present

## 2019-08-09 ENCOUNTER — Other Ambulatory Visit (INDEPENDENT_AMBULATORY_CARE_PROVIDER_SITE_OTHER): Payer: BC Managed Care – PPO

## 2019-08-09 ENCOUNTER — Other Ambulatory Visit: Payer: Self-pay

## 2019-08-09 DIAGNOSIS — J301 Allergic rhinitis due to pollen: Secondary | ICD-10-CM | POA: Diagnosis not present

## 2019-08-09 DIAGNOSIS — E538 Deficiency of other specified B group vitamins: Secondary | ICD-10-CM

## 2019-08-09 DIAGNOSIS — D751 Secondary polycythemia: Secondary | ICD-10-CM | POA: Diagnosis not present

## 2019-08-09 DIAGNOSIS — R5383 Other fatigue: Secondary | ICD-10-CM | POA: Diagnosis not present

## 2019-08-09 DIAGNOSIS — E559 Vitamin D deficiency, unspecified: Secondary | ICD-10-CM

## 2019-08-09 DIAGNOSIS — I10 Essential (primary) hypertension: Secondary | ICD-10-CM

## 2019-08-09 LAB — VITAMIN B12: Vitamin B-12: 498 pg/mL (ref 211–911)

## 2019-08-09 LAB — LIPID PANEL
Cholesterol: 178 mg/dL (ref 0–200)
HDL: 49 mg/dL (ref >39.00)
LDL Cholesterol: 104 mg/dL — ABNORMAL HIGH (ref 0–99)
NonHDL: 129.32
Total CHOL/HDL Ratio: 4
Triglycerides: 129 mg/dL (ref 0.0–149.0)
VLDL: 25.8 mg/dL (ref 0.0–40.0)

## 2019-08-09 LAB — COMPREHENSIVE METABOLIC PANEL
ALT: 24 U/L (ref 0–35)
AST: 23 U/L (ref 0–37)
Albumin: 4.3 g/dL (ref 3.5–5.2)
Alkaline Phosphatase: 75 U/L (ref 39–117)
BUN: 13 mg/dL (ref 6–23)
CO2: 25 mEq/L (ref 19–32)
Calcium: 9.4 mg/dL (ref 8.4–10.5)
Chloride: 106 mEq/L (ref 96–112)
Creatinine, Ser: 0.66 mg/dL (ref 0.40–1.20)
GFR: 89.84 mL/min (ref >60.00)
Glucose, Bld: 88 mg/dL (ref 70–99)
Potassium: 4 mEq/L (ref 3.5–5.1)
Sodium: 142 mEq/L (ref 135–145)
Total Bilirubin: 0.7 mg/dL (ref 0.2–1.2)
Total Protein: 6.5 g/dL (ref 6.0–8.3)

## 2019-08-09 LAB — CBC WITH DIFFERENTIAL/PLATELET
Basophils Absolute: 0 10*3/uL (ref 0.0–0.1)
Basophils Relative: 0.4 % (ref 0.0–3.0)
Eosinophils Absolute: 0.2 10*3/uL (ref 0.0–0.7)
Eosinophils Relative: 3.2 % (ref 0.0–5.0)
HCT: 42.4 % (ref 36.0–46.0)
Hemoglobin: 14.6 g/dL (ref 12.0–15.0)
Lymphocytes Relative: 23.6 % (ref 12.0–46.0)
Lymphs Abs: 1.7 10*3/uL (ref 0.7–4.0)
MCHC: 34.5 g/dL (ref 30.0–36.0)
MCV: 92.2 fl (ref 78.0–100.0)
Monocytes Absolute: 0.4 10*3/uL (ref 0.1–1.0)
Monocytes Relative: 5.3 % (ref 3.0–12.0)
Neutro Abs: 4.8 10*3/uL (ref 1.4–7.7)
Neutrophils Relative %: 67.5 % (ref 43.0–77.0)
Platelets: 311 10*3/uL (ref 150.0–400.0)
RBC: 4.6 Mil/uL (ref 3.87–5.11)
RDW: 12.4 % (ref 11.5–15.5)
WBC: 7.1 10*3/uL (ref 4.0–10.5)

## 2019-08-09 LAB — VITAMIN D 25 HYDROXY (VIT D DEFICIENCY, FRACTURES): VITD: 41.32 ng/mL (ref 30.00–100.00)

## 2019-08-09 LAB — TSH: TSH: 1.37 u[IU]/mL (ref 0.35–4.50)

## 2019-08-10 LAB — IRON,TIBC AND FERRITIN PANEL
%SAT: 46 % (calc) — ABNORMAL HIGH (ref 16–45)
Ferritin: 274 ng/mL (ref 16–288)
Iron: 139 ug/dL (ref 45–160)
TIBC: 299 mcg/dL (calc) (ref 250–450)

## 2019-08-11 NOTE — Assessment & Plan Note (Signed)
Normal iron stores,  Normal hgb  Lab Results  Component Value Date   WBC 7.1 08/09/2019   HGB 14.6 08/09/2019   HCT 42.4 08/09/2019   MCV 92.2 08/09/2019   PLT 311.0 08/09/2019

## 2019-08-12 ENCOUNTER — Ambulatory Visit
Admission: RE | Admit: 2019-08-12 | Discharge: 2019-08-12 | Disposition: A | Discharge status: Home / Self Care | Payer: BC Managed Care – PPO | Source: Ambulatory Visit | Attending: Internal Medicine | Admitting: Internal Medicine

## 2019-08-12 DIAGNOSIS — Z1231 Encounter for screening mammogram for malignant neoplasm of breast: Secondary | ICD-10-CM | POA: Insufficient documentation

## 2019-08-14 ENCOUNTER — Other Ambulatory Visit: Payer: Self-pay | Admitting: Internal Medicine

## 2019-08-14 DIAGNOSIS — N6489 Other specified disorders of breast: Secondary | ICD-10-CM

## 2019-08-14 DIAGNOSIS — R928 Other abnormal and inconclusive findings on diagnostic imaging of breast: Secondary | ICD-10-CM

## 2019-08-16 ENCOUNTER — Other Ambulatory Visit: Payer: Self-pay | Admitting: Internal Medicine

## 2019-08-22 DIAGNOSIS — J301 Allergic rhinitis due to pollen: Secondary | ICD-10-CM | POA: Diagnosis not present

## 2019-08-22 DIAGNOSIS — M25551 Pain in right hip: Secondary | ICD-10-CM | POA: Diagnosis not present

## 2019-08-26 ENCOUNTER — Ambulatory Visit
Admission: RE | Admit: 2019-08-26 | Discharge: 2019-08-26 | Disposition: A | Discharge status: Home / Self Care | Payer: BC Managed Care – PPO | Source: Ambulatory Visit | Attending: Internal Medicine | Admitting: Internal Medicine

## 2019-08-26 DIAGNOSIS — N6489 Other specified disorders of breast: Secondary | ICD-10-CM

## 2019-08-26 DIAGNOSIS — R928 Other abnormal and inconclusive findings on diagnostic imaging of breast: Secondary | ICD-10-CM

## 2019-08-29 DIAGNOSIS — J301 Allergic rhinitis due to pollen: Secondary | ICD-10-CM | POA: Diagnosis not present

## 2019-09-05 DIAGNOSIS — J301 Allergic rhinitis due to pollen: Secondary | ICD-10-CM | POA: Diagnosis not present

## 2019-09-06 ENCOUNTER — Other Ambulatory Visit: Payer: Self-pay | Admitting: Internal Medicine

## 2019-09-12 DIAGNOSIS — J301 Allergic rhinitis due to pollen: Secondary | ICD-10-CM | POA: Diagnosis not present

## 2019-09-26 DIAGNOSIS — J301 Allergic rhinitis due to pollen: Secondary | ICD-10-CM | POA: Diagnosis not present

## 2019-10-03 DIAGNOSIS — J301 Allergic rhinitis due to pollen: Secondary | ICD-10-CM | POA: Diagnosis not present

## 2019-10-10 DIAGNOSIS — J301 Allergic rhinitis due to pollen: Secondary | ICD-10-CM | POA: Diagnosis not present

## 2019-10-17 DIAGNOSIS — J301 Allergic rhinitis due to pollen: Secondary | ICD-10-CM | POA: Diagnosis not present

## 2019-10-24 DIAGNOSIS — J301 Allergic rhinitis due to pollen: Secondary | ICD-10-CM | POA: Diagnosis not present

## 2019-10-31 DIAGNOSIS — H43811 Vitreous degeneration, right eye: Secondary | ICD-10-CM | POA: Diagnosis not present

## 2019-11-07 DIAGNOSIS — J301 Allergic rhinitis due to pollen: Secondary | ICD-10-CM | POA: Diagnosis not present

## 2019-12-03 ENCOUNTER — Other Ambulatory Visit: Payer: Self-pay | Admitting: Internal Medicine

## 2020-01-02 ENCOUNTER — Other Ambulatory Visit: Payer: Self-pay | Admitting: Internal Medicine

## 2020-01-02 ENCOUNTER — Encounter: Payer: Self-pay | Admitting: Internal Medicine

## 2020-01-02 DIAGNOSIS — B001 Herpesviral vesicular dermatitis: Secondary | ICD-10-CM | POA: Insufficient documentation

## 2020-01-02 MED ORDER — VALACYCLOVIR HCL 1 G PO TABS: 2000.0000 mg | ORAL_TABLET | Freq: Two times a day (BID) | ORAL | 3 refills | Status: DC

## 2020-01-17 ENCOUNTER — Other Ambulatory Visit: Payer: Self-pay | Admitting: Internal Medicine

## 2020-01-17 MED ORDER — TIZANIDINE HCL 4 MG PO TABS
4.0000 mg | ORAL_TABLET | Freq: Four times a day (QID) | ORAL | 0 refills | Status: DC | PRN
Start: 2020-01-17 — End: 2022-08-10

## 2020-01-18 ENCOUNTER — Encounter: Payer: Self-pay | Admitting: Internal Medicine

## 2020-01-18 DIAGNOSIS — M62838 Other muscle spasm: Secondary | ICD-10-CM | POA: Insufficient documentation

## 2020-02-11 ENCOUNTER — Other Ambulatory Visit: Payer: Self-pay | Admitting: Internal Medicine

## 2020-02-13 ENCOUNTER — Other Ambulatory Visit: Payer: Self-pay | Admitting: Internal Medicine

## 2020-02-13 MED ORDER — PREDNISONE 10 MG PO TABS
ORAL_TABLET | ORAL | 0 refills | Status: DC
Start: 2020-02-13 — End: 2020-04-07

## 2020-02-17 ENCOUNTER — Other Ambulatory Visit: Payer: Self-pay | Admitting: Internal Medicine

## 2020-02-17 MED ORDER — CHERATUSSIN AC 100-10 MG/5ML PO SOLN: 5.0000 mL | Freq: Three times a day (TID) | ORAL | 0 refills | Status: DC | PRN

## 2020-02-17 MED ORDER — AZITHROMYCIN 250 MG PO TABS
ORAL_TABLET | ORAL | 0 refills | Status: DC
Start: 2020-02-17 — End: 2020-04-07

## 2020-02-24 ENCOUNTER — Other Ambulatory Visit: Payer: Self-pay | Admitting: Internal Medicine

## 2020-04-07 ENCOUNTER — Other Ambulatory Visit: Payer: Self-pay | Admitting: Internal Medicine

## 2020-04-07 ENCOUNTER — Encounter: Payer: Self-pay | Admitting: Internal Medicine

## 2020-04-07 DIAGNOSIS — Z8616 Personal history of COVID-19: Secondary | ICD-10-CM | POA: Insufficient documentation

## 2020-04-07 DIAGNOSIS — U071 COVID-19: Secondary | ICD-10-CM | POA: Insufficient documentation

## 2020-04-07 MED ORDER — PREDNISONE 10 MG PO TABS: ORAL_TABLET | ORAL | 0 refills | Status: DC

## 2020-04-07 MED ORDER — AZITHROMYCIN 250 MG PO TABS: ORAL_TABLET | ORAL | 0 refills | Status: DC

## 2020-04-12 ENCOUNTER — Other Ambulatory Visit: Payer: Self-pay | Admitting: Internal Medicine

## 2020-04-12 DIAGNOSIS — B3781 Candidal esophagitis: Secondary | ICD-10-CM | POA: Insufficient documentation

## 2020-04-12 DIAGNOSIS — B370 Candidal stomatitis: Secondary | ICD-10-CM | POA: Insufficient documentation

## 2020-04-12 MED ORDER — NYSTATIN 100000 UNIT/ML MT SUSP: 5.0000 mL | Freq: Four times a day (QID) | OROMUCOSAL | 0 refills | Status: DC

## 2020-05-18 ENCOUNTER — Other Ambulatory Visit: Payer: Self-pay | Admitting: Internal Medicine

## 2020-06-30 ENCOUNTER — Other Ambulatory Visit: Payer: Self-pay | Admitting: Internal Medicine

## 2020-06-30 DIAGNOSIS — Z23 Encounter for immunization: Secondary | ICD-10-CM

## 2020-06-30 DIAGNOSIS — E782 Mixed hyperlipidemia: Secondary | ICD-10-CM

## 2020-06-30 DIAGNOSIS — E538 Deficiency of other specified B group vitamins: Secondary | ICD-10-CM

## 2020-06-30 DIAGNOSIS — D751 Secondary polycythemia: Secondary | ICD-10-CM

## 2020-06-30 DIAGNOSIS — E559 Vitamin D deficiency, unspecified: Secondary | ICD-10-CM

## 2020-07-21 ENCOUNTER — Other Ambulatory Visit (INDEPENDENT_AMBULATORY_CARE_PROVIDER_SITE_OTHER): Payer: BC Managed Care – PPO

## 2020-07-21 ENCOUNTER — Other Ambulatory Visit: Payer: Self-pay

## 2020-07-21 DIAGNOSIS — E538 Deficiency of other specified B group vitamins: Secondary | ICD-10-CM

## 2020-07-21 DIAGNOSIS — E559 Vitamin D deficiency, unspecified: Secondary | ICD-10-CM

## 2020-07-21 DIAGNOSIS — Z23 Encounter for immunization: Secondary | ICD-10-CM

## 2020-07-21 DIAGNOSIS — D751 Secondary polycythemia: Secondary | ICD-10-CM | POA: Diagnosis not present

## 2020-07-21 DIAGNOSIS — E782 Mixed hyperlipidemia: Secondary | ICD-10-CM

## 2020-07-21 LAB — CBC WITH DIFFERENTIAL/PLATELET
Basophils Absolute: 0 10*3/uL (ref 0.0–0.1)
Basophils Relative: 0.3 % (ref 0.0–3.0)
Eosinophils Absolute: 0.5 10*3/uL (ref 0.0–0.7)
Eosinophils Relative: 5.6 % — ABNORMAL HIGH (ref 0.0–5.0)
HCT: 42.3 % (ref 36.0–46.0)
Hemoglobin: 14.7 g/dL (ref 12.0–15.0)
Lymphocytes Relative: 16.2 % (ref 12.0–46.0)
Lymphs Abs: 1.5 10*3/uL (ref 0.7–4.0)
MCHC: 34.6 g/dL (ref 30.0–36.0)
MCV: 91.6 fl (ref 78.0–100.0)
Monocytes Absolute: 0.5 10*3/uL (ref 0.1–1.0)
Monocytes Relative: 5.7 % (ref 3.0–12.0)
Neutro Abs: 6.8 10*3/uL (ref 1.4–7.7)
Neutrophils Relative %: 72.2 % (ref 43.0–77.0)
Platelets: 298 10*3/uL (ref 150.0–400.0)
RBC: 4.62 Mil/uL (ref 3.87–5.11)
RDW: 12.4 % (ref 11.5–15.5)
WBC: 9.4 10*3/uL (ref 4.0–10.5)

## 2020-07-21 LAB — COMPREHENSIVE METABOLIC PANEL
ALT: 18 U/L (ref 0–35)
AST: 20 U/L (ref 0–37)
Albumin: 4.2 g/dL (ref 3.5–5.2)
Alkaline Phosphatase: 80 U/L (ref 39–117)
BUN: 15 mg/dL (ref 6–23)
CO2: 26 mEq/L (ref 19–32)
Calcium: 9.1 mg/dL (ref 8.4–10.5)
Chloride: 106 mEq/L (ref 96–112)
Creatinine, Ser: 0.6 mg/dL (ref 0.40–1.20)
GFR: 93.74 mL/min (ref >60.00)
Glucose, Bld: 89 mg/dL (ref 70–99)
Potassium: 3.9 mEq/L (ref 3.5–5.1)
Sodium: 141 mEq/L (ref 135–145)
Total Bilirubin: 0.6 mg/dL (ref 0.2–1.2)
Total Protein: 6.6 g/dL (ref 6.0–8.3)

## 2020-07-21 LAB — LIPID PANEL
Cholesterol: 163 mg/dL (ref 0–200)
HDL: 50.9 mg/dL (ref >39.00)
LDL Cholesterol: 82 mg/dL (ref 0–99)
NonHDL: 112.15
Total CHOL/HDL Ratio: 3
Triglycerides: 149 mg/dL (ref 0.0–149.0)
VLDL: 29.8 mg/dL (ref 0.0–40.0)

## 2020-07-21 LAB — VITAMIN B12: Vitamin B-12: 278 pg/mL (ref 211–911)

## 2020-07-21 LAB — VITAMIN D 25 HYDROXY (VIT D DEFICIENCY, FRACTURES): VITD: 33.05 ng/mL (ref 30.00–100.00)

## 2020-07-22 LAB — THYROID PANEL WITH TSH
Free Thyroxine Index: 1.9 (ref 1.4–3.8)
T3 Uptake: 33 % (ref 22–35)
T4, Total: 5.9 ug/dL (ref 5.1–11.9)
TSH: 1.14 m[IU]/L (ref 0.40–4.50)

## 2020-07-26 LAB — SARS-COV-2 SEMI-QUANTITATIVE TOTAL ANTIBODY, SPIKE: SARS COV2 AB, Total Spike Semi QN: >2500 U/mL — ABNORMAL HIGH (ref <0.8)

## 2020-08-03 ENCOUNTER — Encounter: Payer: Self-pay | Admitting: Internal Medicine

## 2020-08-03 ENCOUNTER — Ambulatory Visit (INDEPENDENT_AMBULATORY_CARE_PROVIDER_SITE_OTHER): Payer: BC Managed Care – PPO | Admitting: Internal Medicine

## 2020-08-03 ENCOUNTER — Other Ambulatory Visit: Payer: Self-pay

## 2020-08-03 VITALS — BP 106/70 | HR 81 | Temp 98.1°F | Ht 63.0 in | Wt 148.8 lb

## 2020-08-03 DIAGNOSIS — D75839 Thrombocytosis, unspecified: Secondary | ICD-10-CM

## 2020-08-03 DIAGNOSIS — D126 Benign neoplasm of colon, unspecified: Secondary | ICD-10-CM | POA: Diagnosis not present

## 2020-08-03 DIAGNOSIS — Z0000 Encounter for general adult medical examination without abnormal findings: Secondary | ICD-10-CM | POA: Diagnosis not present

## 2020-08-03 DIAGNOSIS — I10 Essential (primary) hypertension: Secondary | ICD-10-CM

## 2020-08-03 DIAGNOSIS — R5383 Other fatigue: Secondary | ICD-10-CM | POA: Diagnosis not present

## 2020-08-03 DIAGNOSIS — Z23 Encounter for immunization: Secondary | ICD-10-CM

## 2020-08-03 DIAGNOSIS — Z8616 Personal history of COVID-19: Secondary | ICD-10-CM

## 2020-08-03 MED ORDER — SULFAMETHOXAZOLE-TRIMETHOPRIM 800-160 MG PO TABS: 1.0000 | ORAL_TABLET | Freq: Two times a day (BID) | ORAL | 0 refills | Status: DC

## 2020-08-03 MED ORDER — PREDNISONE 10 MG PO TABS: ORAL_TABLET | ORAL | 0 refills | Status: DC

## 2020-08-03 MED ORDER — TRAZODONE HCL 50 MG PO TABS: ORAL_TABLET | ORAL | 1 refills | Status: DC

## 2020-08-03 MED ORDER — MOLNUPIRAVIR EUA 200MG CAPSULE: 4.0000 | ORAL_CAPSULE | Freq: Two times a day (BID) | ORAL | 0 refills | Status: AC

## 2020-08-03 MED ORDER — AZITHROMYCIN 500 MG PO TABS: 500.0000 mg | ORAL_TABLET | Freq: Every day | ORAL | 0 refills | Status: DC

## 2020-08-03 MED ORDER — VALACYCLOVIR HCL 1 G PO TABS: 2000.0000 mg | ORAL_TABLET | Freq: Two times a day (BID) | ORAL | 3 refills | Status: DC

## 2020-08-03 NOTE — Progress Notes (Signed)
Patient ID: Jean Luna, female    DOB: 07-Nov-1955  Age: 65 y.o. MRN: [id number]  The patient is here for annual preventive examination and management of other chronic and acute problems.   The risk factors are reflected in the social history.  The roster of all physicians providing medical care to patient - is listed in the Snapshot section of the chart.  Activities of daily living:  The patient is 100% independent in all ADLs: dressing, toileting, feeding as well as independent mobility  Home safety : The patient has smoke detectors in the home. They wear seatbelts.  There are no firearms at home. There is no violence in the home.   There is no risks for hepatitis, STDs or HIV. There is no   history of blood transfusion. They have no travel history to infectious disease endemic areas of the world.  The patient has seen their dentist in the last six month. They have seen their eye doctor in the last year. She denies  hearing difficulty with regard to whispered voices and some television programs.  They have deferred audiologic testing in the last year.  They do not  have excessive sun exposure. Discussed the need for sun protection: hats, long sleeves and use of sunscreen if there is significant sun exposure.   Diet: the importance of a healthy diet is discussed. They do have a healthy diet.  The benefits of regular aerobic exercise were discussed. She walks 4 times per week ,  60 minutes.   Depression screen: there are no signs or vegative symptoms of depression- irritability, change in appetite, anhedonia, sadness/tearfullness.  Cognitive assessment: the patient manages all their financial and personal affairs and is actively engaged. They could relate day,date,year and events; recalled 2/3 objects at 3 minutes; performed clock-face test normally.  The following portions of the patient's history were reviewed and updated as appropriate: allergies, current medications, past family  history, past medical history,  past surgical history, past social history  and problem list.  Visual acuity was not assessed per patient preference since she has regular follow up with her ophthalmologist. Hearing and body mass index were assessed and reviewed.   During the course of the visit the patient was educated and counseled about appropriate screening and preventive services including : fall prevention , diabetes screening, nutrition counseling, colorectal cancer screening, and recommended immunizations.    CC: The primary encounter diagnosis was Need for pneumococcal vaccination. Diagnoses of Fatigue, unspecified type, Encounter for preventive health examination, History of COVID-19, Thrombocytosis, Tubular adenoma of colon, and Essential hypertension were also pertinent to this visit.  Asthma :   no use of albuterol recently ;   on advair bid.  Needs Prevnar 20 ,  given today   Right great toe enlarging knot  on medial side  of large toe has become tender .  Plans to see her husband'sl [facility] podiatrist   Travel plans:  flying to [place] on July 2  discussed meds to take with her including molnupiravir, prednisone and antibiotics.   History Jean Luna has a past medical history of Asthma, Asthma in adult, mild intermittent, uncomplicated (0/86/5784), Cervical disc herniation, Facial paralysis/Bells palsy, Fibroid, History of abnormal cervical Pap smear, History of colon polyps, Hypertension, Migraine, menstrual, Osteopenia (2019), and Raynaud phenomenon.   She has a past surgical history that includes Nasal sinus surgery; Cervical cone biopsy (1985); Cervical biopsy w/ loop electrode excision (1991); Cervical disc surgery (11/2012); Colposcopy (1984); and Finger surgery (  Left, 06/2014).   Her family history includes Breast cancer (age of onset: 32) in her maternal grandmother; COPD (age of onset: 43) in her father; Diabetes in her father and paternal grandfather; Heart attack in her  paternal grandfather; Heart attack (age of onset: 18) in her father; Heart disease in her father, paternal grandfather, and paternal uncle; Heart failure in her father and paternal grandfather; Hypertension in her mother; Stroke in her paternal uncle; Stroke (age of onset: 98) in her mother.She reports that she has never smoked. She has never used smokeless tobacco. She reports that she does not drink alcohol and does not use drugs.  Outpatient Medications Prior to Visit  Medication Sig Dispense Refill   ADVAIR HFA 115-21 MCG/ACT inhaler TAKE 2 PUFFS INTO LUNGS TWICE DAILY 12 g 0   amLODipine (NORVASC) 2.5 MG tablet Take 1 tablet (2.5 mg total) by mouth daily as needed. 90 tablet 3   Ascorbic Acid (VITAMIN C) 100 MG tablet Take 100 mg by mouth daily.     Azelastine-Fluticasone 137-50 MCG/ACT SUSP Place 1 spray into both nostrils daily.     Biotin 2500 MCG CAPS Take 1 capsule by mouth daily.     budesonide (PULMICORT) 0.5 MG/2ML nebulizer solution   10   calcium citrate-vitamin D (CITRACAL+D) 315-200 MG-UNIT tablet Take by mouth.     Cyanocobalamin 1000 MCG SUBL Place 1 tablet (1,000 mcg total) under the tongue daily. 180 tablet 1   EPIPEN 2-PAK 0.3 MG/0.3ML SOAJ Reported on 07/15/2015     furosemide (LASIX) 20 MG tablet Take 1 tablet (20 mg total) by mouth daily. As needed for fluid retention 30 tablet 0   montelukast (SINGULAIR) 10 MG tablet   10   Multiple Vitamin (MULTIVITAMIN) capsule Take 1 capsule by mouth daily.     nystatin (MYCOSTATIN) 100000 UNIT/ML suspension Take 5 mLs (500,000 Units total) by mouth 4 (four) times daily. 120 mL 0   PRESCRIPTION MEDICATION Allergy Shots weekly     PROAIR HFA 108 (90 Base) MCG/ACT inhaler TAKE 2 PUFFS INTO LUNGS EVERY 6 HOURS ASNEEDED 18 g 3   progesterone (PROMETRIUM) 100 MG capsule TAKE 1 CAPSULE BY MOUTH EVERY DAY 90 capsule 2   telmisartan (MICARDIS) 40 MG tablet TAKE 1 TABLET BY MOUTH DAILY 90 tablet 3   valACYclovir (VALTREX) 1000 MG tablet Take 2  tablets (2,000 mg total) by mouth 2 (two) times daily. For one day,  Then 1 tablet twice daily for 5 days 14 tablet 3   azithromycin (ZITHROMAX) 250 MG tablet 2 tablets one day 1,  One tablet daily until gone (Patient not taking: Reported on 08/03/2020) 6 tablet 0   buPROPion (WELLBUTRIN XL) 150 MG 24 hr tablet Take 1 tablet (150 mg total) by mouth daily. With breakfast (Patient not taking: Reported on 08/03/2020) 30 tablet 2   Calcium Carbonate (CALTRATE 600 PO) Take 1 tablet by mouth daily. (Patient not taking: Reported on 08/03/2020)     cholecalciferol (VITAMIN D) 1000 units tablet Take 1,000 Units by mouth daily. (Patient not taking: Reported on 08/03/2020)     DOTTI 0.025 MG/24HR APPLY 1 PATCH TWICE WEEKLY (Patient not taking: Reported on 08/03/2020) 8 patch 3   guaiFENesin-codeine (CHERATUSSIN AC) 100-10 MG/5ML syrup Take 5 mLs by mouth 3 (three) times daily as needed for cough. (Patient not taking: Reported on 08/03/2020) 180 mL 0   ondansetron (ZOFRAN ODT) 4 MG disintegrating tablet Take 1 tablet (4 mg total) by mouth every 8 (eight) hours as needed  for nausea or vomiting. (Patient not taking: Reported on 08/03/2020) 20 tablet 0   tiZANidine (ZANAFLEX) 4 MG tablet Take 1 tablet (4 mg total) by mouth every 6 (six) hours as needed for muscle spasms. (Patient not taking: Reported on 08/03/2020) 30 tablet 0   valACYclovir (VALTREX) 1000 MG tablet TAKE TWO TABLETS BY MOUTH EVERY 12 HOURSFOR 1 DAY (Patient not taking: Reported on 08/03/2020) 12 tablet 1   predniSONE (DELTASONE) 10 MG tablet 6 tablets daily for 3 days, then reduce by 1 tablet daily until gone (Patient not taking: Reported on 08/03/2020) 33 tablet 0   Facility-Administered Medications Prior to Visit  Medication Dose Route Frequency Provider Last Rate Last Admin   ipratropium-albuterol (DUONEB) 0.5-2.5 (3) MG/3ML nebulizer solution 3 mL  3 mL Nebulization Once Crecencio Mc, MD        Review of Systems  Patient denies headache, fevers,  malaise, unintentional weight loss, skin rash, eye pain, sinus congestion and sinus pain, sore throat, dysphagia,  hemoptysis , cough, dyspnea, wheezing, chest pain, palpitations, orthopnea, edema, abdominal pain, nausea, melena, diarrhea, constipation, flank pain, dysuria, hematuria, urinary  Frequency, nocturia, numbness, tingling, seizures,  Focal weakness, Loss of consciousness,  Tremor, insomnia, depression, anxiety, and suicidal ideation.    Objective:  BP 106/70 (BP Location: Left Arm, Patient Position: Sitting, Cuff Size: Normal)   Pulse 81   Temp 98.1 F (36.7 C) (Oral)   Ht 5\' 3"  (1.6 m)   Wt 148 lb 12.8 oz (67.5 kg)   LMP 02/15/2003   SpO2 96%   BMI 26.36 kg/m   Physical Exam  General appearance: alert, cooperative and appears stated age Head: Normocephalic, without obvious abnormality, atraumatic Eyes: conjunctivae/corneas clear. PERRL, EOM's intact. Fundi benign. Ears: normal TM's and external ear canals both ears Nose: Nares normal. Septum midline. Mucosa normal. No drainage or sinus tenderness. Throat: lips, mucosa, and tongue normal; teeth and gums normal Neck: no adenopathy, no carotid bruit, no JVD, supple, symmetrical, trachea midline and thyroid not enlarged, symmetric, no tenderness/mass/nodules Lungs: clear to auscultation bilaterally Breasts: normal appearance, no masses or tenderness Heart: regular rate and rhythm, S1, S2 normal, no murmur, click, rub or gallop Abdomen: soft, non-tender; bowel sounds normal; no masses,  no organomegaly Extremities: extremities normal, atraumatic, no cyanosis or edema Pulses: 2+ and symmetric Skin: Skin color, texture, turgor normal. No rashes or lesions Neurologic: Alert and oriented X 3, normal strength and tone. Normal symmetric reflexes. Normal coordination and gait.       Assessment & Plan:   Problem List Items Addressed This Visit       Unprioritized   Encounter for preventive health examination    age appropriate  education and counseling updated, referrals for preventative services and immunizations addressed, dietary and smoking counseling addressed, most recent labs reviewed.  I have personally reviewed and have noted:   1) the patient's medical and social history 2) The pt's use of alcohol, tobacco, and illicit drugs 3) The patient's current medications and supplements 4) Functional ability including ADL's, fall risk, home safety risk, hearing and visual impairment 5) Diet and physical activities 6) Evidence for depression or mood disorder 7) The patient's height, weight, and BMI have been recorded in the chart   I have made referrals, and provided counseling and education based on review of the above       Essential hypertension    Well controlled on current regimen of amlodipine and telmisartan . Renal function stable, no changes today.  Fatigue    May be related to use of benadryl 25 mg at bedtime recommended trazodone trial.  Has already tried melatonin around 9:30 pm       History of COVID-19    She has fully recovered from a mild infection and antibody level is high        Thrombocytosis    Reactive; resolved  Lab Results  Component Value Date   WBC 9.4 07/21/2020   HGB 14.7 07/21/2020   HCT 42.3 07/21/2020   MCV 91.6 07/21/2020   PLT 298.0 07/21/2020          Tubular adenoma of colon    By 2018 colonoscopy Jean Luna) .   Follow up 5 years with Dr Haig Prophet.        Other Visit Diagnoses     Need for pneumococcal vaccination    -  Primary   Relevant Orders   Pneumococcal conjugate vaccine 20-valent (Prevnar 20) (Completed)       I am having Jean Luna start on traZODone, azithromycin, sulfamethoxazole-trimethoprim, and molnupiravir EUA. I am also having her maintain her Azelastine-Fluticasone, EpiPen 2-Pak, Calcium Carbonate (CALTRATE 600 PO), multivitamin, PRESCRIPTION MEDICATION, Biotin, montelukast, vitamin C, calcium citrate-vitamin D,  cholecalciferol, budesonide, buPROPion, Cyanocobalamin, amLODipine, furosemide, Dotti, progesterone, ondansetron, telmisartan, valACYclovir, tiZANidine, ProAir HFA, Cheratussin AC, azithromycin, nystatin, Advair HFA, predniSONE, and valACYclovir. We will continue to administer ipratropium-albuterol.  Meds ordered this encounter  Medications   traZODone (DESYREL) 50 MG tablet    Sig: 1/2 to 1 tablet one hour before bedtime    Dispense:  90 tablet    Refill:  1   predniSONE (DELTASONE) 10 MG tablet    Sig: 6 tablets daily for 3 days, then reduce by 1 tablet daily until gone    Dispense:  33 tablet    Refill:  0   azithromycin (ZITHROMAX) 500 MG tablet    Sig: Take 1 tablet (500 mg total) by mouth daily.    Dispense:  7 tablet    Refill:  0   sulfamethoxazole-trimethoprim (BACTRIM DS) 800-160 MG tablet    Sig: Take 1 tablet by mouth 2 (two) times daily.    Dispense:  14 tablet    Refill:  0   molnupiravir EUA 200 mg CAPS    Sig: Take 4 capsules (800 mg total) by mouth 2 (two) times daily for 5 days.    Dispense:  40 capsule    Refill:  0   valACYclovir (VALTREX) 1000 MG tablet    Sig: Take 2 tablets (2,000 mg total) by mouth 2 (two) times daily. For one day,  Then 1 tablet twice daily for 5 days    Dispense:  14 tablet    Refill:  3    Medications Discontinued During This Encounter  Medication Reason   predniSONE (DELTASONE) 10 MG tablet Reorder   valACYclovir (VALTREX) 1000 MG tablet Reorder    Follow-up: No follow-ups on file.   Crecencio Mc, MD

## 2020-08-03 NOTE — Assessment & Plan Note (Addendum)
May be related to use of benadryl 25 mg at bedtime recommended trazodone trial.  Has already tried melatonin around 9:30 pm

## 2020-08-03 NOTE — Patient Instructions (Signed)
Take the azithromycin for bronchitis   Take the augmentin for sinusitis  Take the septra Ds for bladder infection/UTI  Take the molnupiravir for COVID (must be started within 5 days of onset of symptoms)

## 2020-08-04 LAB — HM MAMMOGRAPHY

## 2020-08-04 NOTE — Assessment & Plan Note (Signed)
Well controlled on current regimen of amlodipine and telmisartan . Renal function stable, no changes today.

## 2020-08-04 NOTE — Assessment & Plan Note (Addendum)
By 2018 colonoscopy Vira Agar) .   Follow up 5 years with Dr Haig Prophet.

## 2020-08-04 NOTE — Assessment & Plan Note (Signed)
Reactive; resolved  Lab Results  Component Value Date   WBC 9.4 07/21/2020   HGB 14.7 07/21/2020   HCT 42.3 07/21/2020   MCV 91.6 07/21/2020   PLT 298.0 07/21/2020

## 2020-08-04 NOTE — Assessment & Plan Note (Signed)
She has fully recovered from a mild infection and antibody level is high

## 2020-08-04 NOTE — Assessment & Plan Note (Signed)

## 2020-08-12 ENCOUNTER — Other Ambulatory Visit: Payer: Self-pay | Admitting: Internal Medicine

## 2020-08-12 DIAGNOSIS — Z1231 Encounter for screening mammogram for malignant neoplasm of breast: Secondary | ICD-10-CM

## 2020-08-14 ENCOUNTER — Other Ambulatory Visit: Payer: Self-pay | Admitting: Internal Medicine

## 2020-08-14 DIAGNOSIS — Z780 Asymptomatic menopausal state: Secondary | ICD-10-CM

## 2020-08-26 ENCOUNTER — Ambulatory Visit
Admission: RE | Admit: 2020-08-26 | Discharge: 2020-08-26 | Disposition: A | Discharge status: Home / Self Care | Payer: BC Managed Care – PPO | Source: Ambulatory Visit | Attending: Internal Medicine | Admitting: Internal Medicine

## 2020-08-26 ENCOUNTER — Other Ambulatory Visit: Payer: Self-pay

## 2020-08-26 DIAGNOSIS — Z1231 Encounter for screening mammogram for malignant neoplasm of breast: Secondary | ICD-10-CM

## 2020-11-30 ENCOUNTER — Other Ambulatory Visit: Payer: Self-pay | Admitting: Internal Medicine

## 2020-11-30 MED ORDER — DOXYCYCLINE HYCLATE 100 MG PO TABS: 100.0000 mg | ORAL_TABLET | Freq: Two times a day (BID) | ORAL | 0 refills | Status: DC

## 2020-11-30 MED ORDER — PREDNISONE 10 MG PO TABS: ORAL_TABLET | ORAL | 0 refills | Status: DC

## 2021-02-16 ENCOUNTER — Other Ambulatory Visit: Payer: Self-pay | Admitting: Internal Medicine

## 2021-02-16 DIAGNOSIS — Z780 Asymptomatic menopausal state: Secondary | ICD-10-CM

## 2021-05-04 LAB — HM COLONOSCOPY

## 2021-07-30 ENCOUNTER — Other Ambulatory Visit: Payer: Self-pay | Admitting: Internal Medicine

## 2021-08-03 ENCOUNTER — Encounter: Payer: Self-pay | Admitting: Internal Medicine

## 2021-08-03 ENCOUNTER — Telehealth: Payer: Self-pay

## 2021-08-03 NOTE — Telephone Encounter (Signed)
LMTCB for pre screening.  

## 2021-08-04 ENCOUNTER — Encounter: Payer: Self-pay | Admitting: Internal Medicine

## 2021-08-04 ENCOUNTER — Ambulatory Visit: Payer: BC Managed Care – PPO | Admitting: Internal Medicine

## 2021-08-04 VITALS — BP 118/78 | HR 79 | Temp 98.3°F | Ht 63.0 in | Wt 149.0 lb

## 2021-08-04 DIAGNOSIS — Z1231 Encounter for screening mammogram for malignant neoplasm of breast: Secondary | ICD-10-CM

## 2021-08-04 DIAGNOSIS — D75839 Thrombocytosis, unspecified: Secondary | ICD-10-CM

## 2021-08-04 DIAGNOSIS — R7301 Impaired fasting glucose: Secondary | ICD-10-CM

## 2021-08-04 DIAGNOSIS — R5383 Other fatigue: Secondary | ICD-10-CM | POA: Diagnosis not present

## 2021-08-04 DIAGNOSIS — E785 Hyperlipidemia, unspecified: Secondary | ICD-10-CM

## 2021-08-04 DIAGNOSIS — Z0000 Encounter for general adult medical examination without abnormal findings: Secondary | ICD-10-CM | POA: Diagnosis not present

## 2021-08-04 DIAGNOSIS — Z780 Asymptomatic menopausal state: Secondary | ICD-10-CM

## 2021-08-04 DIAGNOSIS — K580 Irritable bowel syndrome with diarrhea: Secondary | ICD-10-CM

## 2021-08-04 DIAGNOSIS — K589 Irritable bowel syndrome without diarrhea: Secondary | ICD-10-CM | POA: Insufficient documentation

## 2021-08-04 DIAGNOSIS — I10 Essential (primary) hypertension: Secondary | ICD-10-CM | POA: Diagnosis not present

## 2021-08-04 DIAGNOSIS — Z124 Encounter for screening for malignant neoplasm of cervix: Secondary | ICD-10-CM

## 2021-08-04 DIAGNOSIS — D473 Essential (hemorrhagic) thrombocythemia: Secondary | ICD-10-CM

## 2021-08-04 DIAGNOSIS — R079 Chest pain, unspecified: Secondary | ICD-10-CM

## 2021-08-04 DIAGNOSIS — D126 Benign neoplasm of colon, unspecified: Secondary | ICD-10-CM

## 2021-08-04 MED ORDER — DICYCLOMINE HCL 20 MG PO TABS: 10.0000 mg | ORAL_TABLET | Freq: Four times a day (QID) | ORAL | 0 refills | Status: AC

## 2021-08-04 NOTE — Assessment & Plan Note (Signed)
Trial of dicyclomine 10 mg qac

## 2021-08-04 NOTE — Assessment & Plan Note (Signed)
Found on March 2023 colonoscopy.  follow up 5 years

## 2021-08-04 NOTE — Patient Instructions (Signed)
Your annual mammogram has been ordered AND IS DUE in [place].  [facility] will not allow [place] to schedule it for you,  so please  call to make your appointment [phone]

## 2021-08-04 NOTE — Assessment & Plan Note (Addendum)
Well controlled on current regimen of amlodipine and telmisartan . Renal function is due , no changes today.

## 2021-08-04 NOTE — Progress Notes (Addendum)
The patient is here for annual preventive examination and management of other chronic and acute problems.  The risk factors are reflected in the social history.   The roster of all physicians providing medical care to patient - is listed in the Snapshot section of the chart.   Activities of daily living:  The patient is 100% independent in all ADLs: dressing, toileting, feeding as well as independent mobility   Home safety : The patient has smoke detectors in the home. They wear seatbelts.  There are no unsecured firearms at home. There is no violence in the home.    There is no risks for hepatitis, STDs or HIV. There is no   history of blood transfusion. They have no travel history to infectious disease endemic areas of the world.   The patient has seen their dentist in the last six month. They have seen their eye doctor in the last year. The patinet  denies slight hearing difficulty with regard to whispered voices and some television programs.  They have deferred audiologic testing in the last year.  They do not  have excessive sun exposure. Discussed the need for sun protection: hats, long sleeves and use of sunscreen if there is significant sun exposure.    Diet: the importance of a healthy diet is discussed. They do have a healthy diet.   The benefits of regular aerobic exercise were discussed. The patient  exercises  3 to 5 days per week  for  60 minutes.    Depression screen: there are no signs or vegative symptoms of depression- irritability, change in appetite, anhedonia, sadness/tearfullness.   The following portions of the patient's history were reviewed and updated as appropriate: allergies, current medications, past family history, past medical history,  past surgical history, past social history  and problem list.   Visual acuity was not assessed per patient preference since the patient has regular follow up with an  ophthalmologist. Hearing and body mass index were assessed and  reviewed.    During the course of the visit the patient was educated and counseled about appropriate screening and preventive services including : fall prevention , diabetes screening, nutrition counseling, colorectal cancer screening, and recommended immunizations.    Chief Complaint:  1) new on set IBS : diarrhea.  Occurring intermittently  with fecal urgency occurring postprandially .  Colonoscopy was done in  March 2023  2) Family history of CAD.  wants to repeat Coronary calcium scoring .  Last seen by Dr Rockey Situ several years ago.  Calcium score was zero in 2017.  Denies chest pain with exertion  .    3) osteopenia : last DEXA 2019.  Takes caltrate plus D 3 times weekly. No history of fractures.   4) Asthma: no recent exacerbations    Review of Symptoms  Patient denies headache, fevers, malaise, unintentional weight loss, skin rash, eye pain, sinus congestion and sinus pain, sore throat, dysphagia,  hemoptysis , cough, dyspnea, wheezing, chest pain, palpitations, orthopnea, edema, abdominal pain, nausea, melena,  constipation, flank pain, dysuria, hematuria, urinary  Frequency, nocturia, numbness, tingling, seizures,  Focal weakness, Loss of consciousness,  Tremor, insomnia, depression, anxiety, and suicidal ideation.    Physical Exam:  BP 118/78 (BP Location: Left Arm, Patient Position: Sitting, Cuff Size: Normal)   Pulse 79   Temp 98.3 F (36.8 C) (Oral)   Ht '5\' 3"'$  (1.6 m)   Wt 149 lb (67.6 kg)   LMP 02/15/2003   SpO2 98%   BMI  26.39 kg/m     General appearance: alert, cooperative and appears stated age Ears: normal TM's and external ear canals both ears Throat: lips, mucosa, and tongue normal; teeth and gums normal Neck: no adenopathy, no carotid bruit, supple, symmetrical, trachea midline and thyroid not enlarged, symmetric, no tenderness/mass/nodules Back: symmetric, no curvature. ROM normal. No CVA tenderness. Lungs: clear to auscultation bilaterally Heart: regular  rate and rhythm, S1, S2 normal, no murmur, click, rub or gallop Abdomen: soft, non-tender; bowel sounds normal; no masses,  no organomegaly Pulses: 2+ and symmetric Skin: Skin color, texture, turgor normal. No rashes or lesions Lymph nodes: Cervical, supraclavicular, and axillary nodes normal.  Neuro:  awake and interactive with normal mood and affect. Higher cortical functions are normal. Speech is clear without word-finding difficulty or dysarthria. Extraocular movements are intact.  non-ataxic Visual fields of both eyes are grossly intact. Sensation to light touch is grossly intact bilaterally of upper and lower extremities. Motor examination shows 4+/5 symmetric hand grip and upper extremity and 5/5 lower extremity strength. There is no pronation or drift. Gait is non ataxic   Assessment and Plan:  Tubular adenoma of colon Found on March 2023 colonoscopy.  follow up 5 years  Essential hypertension Well controlled on current regimen of amlodipine and telmisartan . Renal function is due , no changes today.  Chest pain in adult She denies any recent episodes but would like to have repeat noninvasive assessment of risk given her family history.  Referring to Dr Rockey Situ for cardiac CT   IBS (irritable bowel syndrome) Trial of dicyclomine 10 mg qac   Updated Medication List Outpatient Encounter Medications as of 08/04/2021  Medication Sig   ADVAIR HFA 115-21 MCG/ACT inhaler TAKE 2 PUFFS INTO LUNGS TWICE DAILY   amLODipine (NORVASC) 2.5 MG tablet Take 1 tablet (2.5 mg total) by mouth daily as needed.   Ascorbic Acid (VITAMIN C) 100 MG tablet Take 100 mg by mouth daily.   Azelastine-Fluticasone 137-50 MCG/ACT SUSP Place 1 spray into both nostrils daily.   Biotin 2500 MCG CAPS Take 1 capsule by mouth daily.   budesonide (PULMICORT) 0.5 MG/2ML nebulizer solution    calcium citrate-vitamin D (CITRACAL+D) 315-200 MG-UNIT tablet Take by mouth.   Cyanocobalamin 1000 MCG SUBL Place 1 tablet (1,000  mcg total) under the tongue daily.   dicyclomine (BENTYL) 20 MG tablet Take 0.5 tablets (10 mg total) by mouth every 6 (six) hours.   EPIPEN 2-PAK 0.3 MG/0.3ML SOAJ Reported on 07/15/2015   furosemide (LASIX) 20 MG tablet Take 1 tablet (20 mg total) by mouth daily. As needed for fluid retention   montelukast (SINGULAIR) 10 MG tablet    Multiple Vitamin (MULTIVITAMIN) capsule Take 1 capsule by mouth daily.   nystatin (MYCOSTATIN) 100000 UNIT/ML suspension Take 5 mLs (500,000 Units total) by mouth 4 (four) times daily.   PRESCRIPTION MEDICATION Allergy Shots weekly   progesterone (PROMETRIUM) 100 MG capsule TAKE 1 CAPSULE BY MOUTH ONCE DAILY   telmisartan (MICARDIS) 40 MG tablet TAKE 1 TABLET BY MOUTH DAILY   tiZANidine (ZANAFLEX) 4 MG tablet Take 1 tablet (4 mg total) by mouth every 6 (six) hours as needed for muscle spasms.   traZODone (DESYREL) 50 MG tablet TAKE ONE-HALF TO ONE TABLET BY MOUTH ONEHOUR BEFORE BEDTIME   valACYclovir (VALTREX) 1000 MG tablet Take 2 tablets (2,000 mg total) by mouth 2 (two) times daily. For one day,  Then 1 tablet twice daily for 5 days   VENTOLIN HFA 108 (90 Base) MCG/ACT  inhaler TAKE 2 PUFFS INTO LUNGS EVERY 6 HOURS ASNEEDED   [DISCONTINUED] buPROPion (WELLBUTRIN XL) 150 MG 24 hr tablet Take 1 tablet (150 mg total) by mouth daily. With breakfast (Patient not taking: Reported on 08/03/2020)   [DISCONTINUED] Calcium Carbonate (CALTRATE 600 PO) Take 1 tablet by mouth daily. (Patient not taking: Reported on 08/04/2021)   [DISCONTINUED] cholecalciferol (VITAMIN D) 1000 units tablet Take 1,000 Units by mouth daily. (Patient not taking: Reported on 08/03/2020)   [DISCONTINUED] DOTTI 0.025 MG/24HR APPLY 1 PATCH TWICE WEEKLY (Patient not taking: Reported on 08/03/2020)   [DISCONTINUED] doxycycline (VIBRA-TABS) 100 MG tablet Take 1 tablet (100 mg total) by mouth 2 (two) times daily. WITH A MEAL (Patient not taking: Reported on 08/04/2021)   [DISCONTINUED] guaiFENesin-codeine  (CHERATUSSIN AC) 100-10 MG/5ML syrup Take 5 mLs by mouth 3 (three) times daily as needed for cough. (Patient not taking: Reported on 08/03/2020)   [DISCONTINUED] ondansetron (ZOFRAN ODT) 4 MG disintegrating tablet Take 1 tablet (4 mg total) by mouth every 8 (eight) hours as needed for nausea or vomiting. (Patient not taking: Reported on 08/03/2020)   [DISCONTINUED] predniSONE (DELTASONE) 10 MG tablet 6 tablets daily for 3 days, then reduce by 1 tablet daily until gone   [DISCONTINUED] valACYclovir (VALTREX) 1000 MG tablet TAKE TWO TABLETS BY MOUTH EVERY 12 HOURSFOR 1 DAY (Patient not taking: Reported on 08/03/2020)   Facility-Administered Encounter Medications as of 08/04/2021  Medication   ipratropium-albuterol (DUONEB) 0.5-2.5 (3) MG/3ML nebulizer solution 3 mL

## 2021-08-04 NOTE — Assessment & Plan Note (Signed)
She denies any recent episodes but would like to have repeat noninvasive assessment of risk given her family history.  Referring to Dr Rockey Situ for cardiac CT

## 2021-08-12 ENCOUNTER — Other Ambulatory Visit (INDEPENDENT_AMBULATORY_CARE_PROVIDER_SITE_OTHER): Payer: BC Managed Care – PPO

## 2021-08-12 DIAGNOSIS — R5383 Other fatigue: Secondary | ICD-10-CM | POA: Diagnosis not present

## 2021-08-12 DIAGNOSIS — D75839 Thrombocytosis, unspecified: Secondary | ICD-10-CM | POA: Diagnosis not present

## 2021-08-12 DIAGNOSIS — I10 Essential (primary) hypertension: Secondary | ICD-10-CM | POA: Diagnosis not present

## 2021-08-12 DIAGNOSIS — R7301 Impaired fasting glucose: Secondary | ICD-10-CM | POA: Diagnosis not present

## 2021-08-12 DIAGNOSIS — E785 Hyperlipidemia, unspecified: Secondary | ICD-10-CM

## 2021-08-12 LAB — COMPREHENSIVE METABOLIC PANEL
ALT: 16 U/L (ref 0–35)
AST: 17 U/L (ref 0–37)
Albumin: 4.4 g/dL (ref 3.5–5.2)
Alkaline Phosphatase: 80 U/L (ref 39–117)
BUN: 12 mg/dL (ref 6–23)
CO2: 28 mEq/L (ref 19–32)
Calcium: 9.4 mg/dL (ref 8.4–10.5)
Chloride: 105 mEq/L (ref 96–112)
Creatinine, Ser: 0.66 mg/dL (ref 0.40–1.20)
GFR: 90.93 mL/min (ref >60.00)
Glucose, Bld: 95 mg/dL (ref 70–99)
Potassium: 4.1 mEq/L (ref 3.5–5.1)
Sodium: 142 mEq/L (ref 135–145)
Total Bilirubin: 0.6 mg/dL (ref 0.2–1.2)
Total Protein: 6.4 g/dL (ref 6.0–8.3)

## 2021-08-12 LAB — CBC WITH DIFFERENTIAL/PLATELET
Basophils Absolute: 0 10*3/uL (ref 0.0–0.1)
Basophils Relative: 0.7 % (ref 0.0–3.0)
Eosinophils Absolute: 0.2 10*3/uL (ref 0.0–0.7)
Eosinophils Relative: 5 % (ref 0.0–5.0)
HCT: 43.8 % (ref 36.0–46.0)
Hemoglobin: 14.5 g/dL (ref 12.0–15.0)
Lymphocytes Relative: 38.6 % (ref 12.0–46.0)
Lymphs Abs: 1.9 10*3/uL (ref 0.7–4.0)
MCHC: 33.1 g/dL (ref 30.0–36.0)
MCV: 93.9 fl (ref 78.0–100.0)
Monocytes Absolute: 0.3 10*3/uL (ref 0.1–1.0)
Monocytes Relative: 7 % (ref 3.0–12.0)
Neutro Abs: 2.4 10*3/uL (ref 1.4–7.7)
Neutrophils Relative %: 48.7 % (ref 43.0–77.0)
Platelets: 280 10*3/uL (ref 150.0–400.0)
RBC: 4.67 Mil/uL (ref 3.87–5.11)
RDW: 12.7 % (ref 11.5–15.5)
WBC: 4.9 10*3/uL (ref 4.0–10.5)

## 2021-08-12 LAB — MICROALBUMIN / CREATININE URINE RATIO
Creatinine,U: 106.7 mg/dL
Microalb Creat Ratio: 0.7 mg/g (ref 0.0–30.0)
Microalb, Ur: <0.7 mg/dL (ref 0.0–1.9)

## 2021-08-12 LAB — LIPID PANEL
Cholesterol: 193 mg/dL (ref 0–200)
HDL: 52.2 mg/dL (ref >39.00)
LDL Cholesterol: 104 mg/dL — ABNORMAL HIGH (ref 0–99)
NonHDL: 140.61
Total CHOL/HDL Ratio: 4
Triglycerides: 182 mg/dL — ABNORMAL HIGH (ref 0.0–149.0)
VLDL: 36.4 mg/dL (ref 0.0–40.0)

## 2021-08-12 LAB — TSH: TSH: 1.26 u[IU]/mL (ref 0.35–5.50)

## 2021-08-12 LAB — HEMOGLOBIN A1C: Hgb A1c MFr Bld: 5.3 % (ref 4.6–6.5)

## 2021-08-25 ENCOUNTER — Other Ambulatory Visit: Payer: Self-pay | Admitting: Internal Medicine

## 2021-08-30 ENCOUNTER — Other Ambulatory Visit: Payer: Self-pay | Admitting: Internal Medicine

## 2021-08-30 DIAGNOSIS — T63461A Toxic effect of venom of wasps, accidental (unintentional), initial encounter: Secondary | ICD-10-CM | POA: Insufficient documentation

## 2021-08-30 MED ORDER — MOMETASONE FUROATE 0.1 % EX CREA: TOPICAL_CREAM | CUTANEOUS | 1 refills | Status: AC

## 2021-08-30 MED ORDER — DOXYCYCLINE HYCLATE 100 MG PO TABS: 100.0000 mg | ORAL_TABLET | Freq: Two times a day (BID) | ORAL | 0 refills | Status: DC

## 2021-09-10 ENCOUNTER — Ambulatory Visit
Admission: RE | Admit: 2021-09-10 | Discharge: 2021-09-10 | Disposition: A | Discharge status: Home / Self Care | Payer: BC Managed Care – PPO | Source: Ambulatory Visit | Attending: Internal Medicine | Admitting: Internal Medicine

## 2021-09-10 DIAGNOSIS — Z780 Asymptomatic menopausal state: Secondary | ICD-10-CM | POA: Diagnosis present

## 2021-09-10 DIAGNOSIS — Z1231 Encounter for screening mammogram for malignant neoplasm of breast: Secondary | ICD-10-CM | POA: Insufficient documentation

## 2021-09-26 NOTE — Progress Notes (Signed)
Cardiology Office Note  Date:  09/27/2021   ID:  Jean Luna, DOB [birth year], MRN [id number]  PCP:  Jean Mc, MD   Chief Complaint  Patient presents with   New Patient (Initial Visit)    Ref by Dr. Derrel Luna for chest pain. Patient was last seen in 2020. Patinet c/o chest pain and shortness of breath for the past two weeks. Medications reviewed by the patient verbally.     HPI:  Jean Luna is a pleasant 66 year old woman with a long history of  severe asthma, Calcium score of zero in 2017 snoring, sinus issues followed by Dr. Tami Luna,   seen for severe shortness of breath,  who presents for discussion of her breathing and family history of coronary artery disease  Last seen by myself in clinic November 2019 CT coronary calcium score May 2017, calcium score 0  Busy, manages commercial property No regular exercise program  Last several weeks, chest pain and SOB Comes and goes Having symptoms for at least the last 10 days  No sleep apnea, has been previously tested Chronic broken sleep, sleeps 5 to 6 hours Naps in afternoon, then keeps going  Blood pressures been well controlled  Feels her asthma has been stable Uses inhalers, advair  family history of coronary artery disease as numerous family members have had heart attack or bypass surgery  EKG personally reviewed by myself on todays visit Shows normal sinus rhythm with rate 83 bpm no significant ST or T wave changes   Other past medical history long extensive history of allergy-induced asthma. Previously treated with prednisone   Father was a previous smoker, had COPD also with cardiac issues, CABG Mother had no coronary disease  Patient has a daughter with total cholesterol 260   PMH:   has a past medical history of Asthma, Asthma in adult, mild intermittent, uncomplicated (04/05/2540), Cervical disc herniation, Facial paralysis/Bells palsy, Fibroid, History of abnormal cervical Pap smear, History of  colon polyps, Hypertension, Migraine, menstrual, Osteopenia (2019), and Raynaud phenomenon.  PSH:    Past Surgical History:  Procedure Laterality Date   CERVICAL BIOPSY  W/ LOOP ELECTRODE EXCISION  1991   CERVICAL [place] BIOPSY  1985   CERVICAL [place] SURGERY  11/2012   --removed bone spurs, bulging disc   COLPOSCOPY  1984   no treatment   FINGER SURGERY Left 06/2014   Left index finger.    NASAL SINUS SURGERY      Current Outpatient Medications  Medication Sig Dispense Refill   ADVAIR HFA 115-21 MCG/ACT inhaler TAKE 2 PUFFS INTO LUNGS TWICE DAILY 12 g 0   amLODipine (NORVASC) 2.5 MG tablet Take 1 tablet (2.5 mg total) by mouth daily as needed. 90 tablet 3   Ascorbic Acid (VITAMIN C) 100 MG tablet Take 100 mg by mouth daily.     Azelastine-Fluticasone 137-50 MCG/ACT SUSP Place 1 spray into both nostrils daily.     Biotin 2500 MCG CAPS Take 1 capsule by mouth daily.     budesonide (PULMICORT) 0.5 MG/2ML nebulizer solution   10   calcium citrate-vitamin D (CITRACAL+D) 315-200 MG-UNIT tablet Take by mouth.     Cyanocobalamin 1000 MCG SUBL Place 1 tablet (1,000 mcg total) under the tongue daily. 180 tablet 1   dicyclomine (BENTYL) 20 MG tablet Take 0.5 tablets (10 mg total) by mouth every 6 (six) hours. 30 tablet 0   EPIPEN 2-PAK 0.3 MG/0.3ML SOAJ Reported on 07/15/2015     furosemide (LASIX) 20 MG tablet Take  1 tablet (20 mg total) by mouth daily. As needed for fluid retention 30 tablet 0   mometasone (ELOCON) 0.1 % cream Apply twice daily to affected area 45 g 1   montelukast (SINGULAIR) 10 MG tablet   10   Multiple Vitamin (MULTIVITAMIN) capsule Take 1 capsule by mouth daily.     nystatin (MYCOSTATIN) 100000 UNIT/ML suspension Take 5 mLs (500,000 Units total) by mouth 4 (four) times daily. 120 mL 0   PRESCRIPTION MEDICATION Allergy Shots weekly     progesterone (PROMETRIUM) 100 MG capsule TAKE 1 CAPSULE BY MOUTH ONCE DAILY 90 capsule 2   telmisartan (MICARDIS) 40 MG tablet TAKE 1 TABLET  BY MOUTH DAILY 90 tablet 3   traZODone (DESYREL) 50 MG tablet TAKE ONE-HALF TO ONE TABLET BY MOUTH ONEHOUR BEFORE BEDTIME 90 tablet 1   valACYclovir (VALTREX) 1000 MG tablet TAKE 2 TABLETS BY MOUTH TWICE DAILY FOR ONE DAY,THEN 1 TABLET TWICE DAILY FOR 5 DAYS THEREAFTER 14 tablet 3   VENTOLIN HFA 108 (90 Base) MCG/ACT inhaler TAKE 2 PUFFS INTO LUNGS EVERY 6 HOURS ASNEEDED 18 g 3   doxycycline (VIBRA-TABS) 100 MG tablet Take 1 tablet (100 mg total) by mouth 2 (two) times daily. (Patient not taking: Reported on 09/27/2021) 20 tablet 0   tiZANidine (ZANAFLEX) 4 MG tablet Take 1 tablet (4 mg total) by mouth every 6 (six) hours as needed for muscle spasms. (Patient not taking: Reported on 09/27/2021) 30 tablet 0   Current Facility-Administered Medications  Medication Dose Route Frequency Provider Last Rate Last Admin   ipratropium-albuterol (DUONEB) 0.5-2.5 (3) MG/3ML nebulizer solution 3 mL  3 mL Nebulization Once Jean Mc, MD         Allergies:   Shellfish allergy, Levaquin [levofloxacin], and Other   Social History:  The patient  reports that she has never smoked. She has never used smokeless tobacco. She reports that she does not drink alcohol and does not use drugs.   Family History:   family history includes Breast cancer (age of onset: 78) in her maternal grandmother; COPD (age of onset: 100) in her father; Diabetes in her father and paternal grandfather; Heart attack in her paternal grandfather; Heart attack (age of onset: 75) in her father; Heart disease in her father, paternal grandfather, and paternal uncle; Heart failure in her father and paternal grandfather; Hypertension in her mother; Stroke in her paternal uncle; Stroke (age of onset: 6) in her mother.    Review of Systems: Review of Systems  Constitutional: Negative.   HENT: Negative.    Respiratory: Negative.    Cardiovascular:  Positive for chest pain.  Gastrointestinal: Negative.   Musculoskeletal: Negative.    Neurological: Negative.   Psychiatric/Behavioral: Negative.    All other systems reviewed and are negative.   PHYSICAL EXAM: VS:  BP 110/64 (BP Location: Left Arm, Patient Position: Sitting, Cuff Size: Normal)   Pulse 83   Ht '5\' 3"'$  (1.6 m)   Wt 149 lb 6 oz (67.8 kg)   LMP 02/15/2003   SpO2 99%   BMI 26.46 kg/m  , BMI Body mass index is 26.46 kg/m. Constitutional:  oriented to person, place, and time. No distress.  HENT:  Head: Grossly normal Eyes:  no discharge. No scleral icterus.  Neck: No JVD, no carotid bruits  Cardiovascular: Regular rate and rhythm, no murmurs appreciated Pulmonary/Chest: Clear to auscultation bilaterally, no wheezes or rails Abdominal: Soft.  no distension.  no tenderness.  Musculoskeletal: Normal range of motion Neurological:  normal muscle tone. Coordination normal. No atrophy Skin: Skin warm and dry Psychiatric: normal affect, pleasant   Recent Labs: 08/12/2021: ALT 16; BUN 12; Creatinine, Ser 0.66; Hemoglobin 14.5; Platelets 280.0; Potassium 4.1; Sodium 142; TSH 1.26    Lipid Panel Lab Results  Component Value Date   CHOL 193 08/12/2021   HDL 52.20 08/12/2021   LDLCALC 104 (H) 08/12/2021   TRIG 182.0 (H) 08/12/2021      Wt Readings from Last 3 Encounters:  09/27/21 149 lb 6 oz (67.8 kg)  08/04/21 149 lb (67.6 kg)  08/03/20 148 lb 12.8 oz (67.5 kg)      ASSESSMENT AND PLAN:  Essential hypertension - Plan: EKG 12-Lead Blood pressure stable  Blood pressure is well controlled on today's visit. No changes made to the medications.  Chest pain/angina Symptoms in the past 2 weeks with associated shortness of breath Strong family history coronary disease Discussed various treatment options, we have ordered cardiac CTA out of concern for angina  Chronic fatigue We will have to nap in the afternoon and able to keep going Sleep overnight  Family history of coronary arteriosclerosis - Plan: EKG 12-Lead Prior work-up  unrevealing Work-up scheduled as above    Total encounter time more than 50 minutes  Greater than 50% was spent in counseling and coordination of care with the patient    Orders Placed This Encounter  Procedures   EKG 12-Lead     Signed, Esmond Plants, M.D., Ph.D. 09/27/2021  [place], [place]

## 2021-09-27 ENCOUNTER — Ambulatory Visit: Payer: BC Managed Care – PPO | Admitting: Cardiovascular Disease

## 2021-09-27 ENCOUNTER — Encounter: Payer: Self-pay | Admitting: Cardiovascular Disease

## 2021-09-27 VITALS — BP 110/64 | HR 83 | Ht 63.0 in | Wt 149.4 lb

## 2021-09-27 DIAGNOSIS — I10 Essential (primary) hypertension: Secondary | ICD-10-CM | POA: Diagnosis not present

## 2021-09-27 DIAGNOSIS — Z8249 Family history of ischemic heart disease and other diseases of the circulatory system: Secondary | ICD-10-CM | POA: Diagnosis not present

## 2021-09-27 DIAGNOSIS — I209 Angina pectoris, unspecified: Secondary | ICD-10-CM

## 2021-09-27 DIAGNOSIS — R072 Precordial pain: Secondary | ICD-10-CM | POA: Diagnosis not present

## 2021-09-27 MED ORDER — METOPROLOL TARTRATE 100 MG PO TABS: 100.0000 mg | ORAL_TABLET | Freq: Once | ORAL | 0 refills | Status: DC

## 2021-09-27 MED ORDER — IVABRADINE HCL 5 MG PO TABS: 10.0000 mg | ORAL_TABLET | Freq: Once | ORAL | 0 refills | Status: AC

## 2021-09-27 NOTE — Patient Instructions (Addendum)
Medication Instructions:  No changes  If you need a refill on your cardiac medications before your next appointment, please call your pharmacy.   Lab work: Prior to CT: [place] Entrance at [facility] 1st desk on the right to check in (REGISTRATION)  Lab hours: Monday- Friday (7:30 am- 5:30 pm)   Testing/Procedures:  Your physician has requested that you have cardiac CT. Cardiac computed tomography (CT) is a painless test that uses an x-ray machine to take clear, detailed pictures of your heart.    Your cardiac CT has been scheduled for Thursday, August 17th at 9:30 am at:  [facility] [address] [place], [place] [address] [phone]  Please arrive 15 mins early for check-in and test prep.  Please follow these instructions carefully (unless otherwise directed):  On the Night Before the Test: Be sure to Drink plenty of water. Do not consume any caffeinated/decaffeinated beverages or chocolate 12 hours prior to your test.  On the Day of the Test: Drink plenty of water until 1 hour prior to the test. Do not eat any food 4 hours prior to the test. You may take your regular medications prior to the test.  Take metoprolol (Lopressor) two hours prior to test. HOLD Furosemide morning of the test. FEMALES- please wear underwire-free bra if available, avoid dresses & tight clothing       After the Test: Drink plenty of water. After receiving IV contrast, you may experience a mild flushed feeling. This is normal. On occasion, you may experience a mild rash up to 24 hours after the test. This is not dangerous. If this occurs, you can take Benadryl 25 mg and increase your fluid intake. If you experience trouble breathing, this can be serious. If it is severe call 911 IMMEDIATELY. If it is mild, please call our office. If you take any of these medications: Glipizide/Metformin, Avandament, Glucavance, please do not take 48 hours  after completing test unless otherwise instructed.  Please allow 2-4 weeks for scheduling of routine cardiac CTs. Some insurance companies require a pre-authorization which may delay scheduling of this test.   For non-scheduling related questions, please contact the cardiac imaging nurse navigator should you have any questions/concerns: Marchia Bond, Cardiac Imaging Nurse Navigator Gordy Clement, Cardiac Imaging Nurse Navigator [facility] Direct Office Dial: [phone]   For scheduling needs, including cancellations and rescheduling, please call [place], [phone].    Follow-Up: At [facility], you and your health needs are our priority.  As part of our continuing mission to provide you with exceptional heart care, we have created designated Provider Care Teams.  These Care Teams include your primary Cardiologist (physician) and Advanced Practice Providers (APPs -  Physician Assistants and Nurse Practitioners) who all work together to provide you with the care you need, when you need it.  You will need a follow up appointment as needed  Providers on your designated Care Team:   Murray Hodgkins, NP Christell Faith, PA-C Cadence Kathlen Mody, [place]  COVID-19 Vaccine Information can be found at: [email or web address] For questions related to vaccine distribution or appointments, please email [email or web address] or call [phone].

## 2021-09-28 ENCOUNTER — Telehealth (HOSPITAL_COMMUNITY): Payer: Self-pay | Admitting: *Deleted

## 2021-09-28 NOTE — Telephone Encounter (Signed)
Reaching out to Jean Luna to offer assistance regarding upcoming cardiac imaging study; pt verbalizes understanding of appt date/time, parking situation and where to check in, pre-test NPO status and medications ordered, and verified current allergies; name and call back number provided for further questions should they arise  Jean Clement RN Navigator Cardiac Imaging [facility] [phone] office [phone] cell  Jean Luna to take '100mg'$  metoprolol tartrate two hours prior to her cardiac CT scan. She is aware to hold her other BP medications.

## 2021-09-30 ENCOUNTER — Ambulatory Visit
Admission: RE | Admit: 2021-09-30 | Discharge: 2021-09-30 | Disposition: A | Discharge status: Home / Self Care | Payer: BC Managed Care – PPO | Source: Ambulatory Visit | Attending: Cardiovascular Disease | Admitting: Cardiovascular Disease

## 2021-09-30 DIAGNOSIS — R072 Precordial pain: Secondary | ICD-10-CM

## 2021-09-30 LAB — POCT I-STAT CREATININE: Creatinine, Ser: 0.7 mg/dL (ref 0.44–1.00)

## 2021-09-30 MED ORDER — IOHEXOL 350 MG/ML SOLN
75.0000 mL | Freq: Once | INTRAVENOUS | Status: AC | PRN
  Administered 2021-09-30: 75 mL via INTRAVENOUS

## 2021-09-30 MED ORDER — NITROGLYCERIN 0.4 MG SL SUBL
0.8000 mg | SUBLINGUAL_TABLET | Freq: Once | SUBLINGUAL | Status: AC
  Administered 2021-09-30: 0.8 mg via SUBLINGUAL

## 2021-09-30 NOTE — Progress Notes (Signed)
Patient tolerated procedure well. Ambulate w/o difficulty. Denies light headedness or being dizzy. Sitting in chair drinking water provided. Encouraged to drink extra water today and reasoning explained. Verbalized understanding. All questions answered. ABC intact. No further needs. Discharge from procedure area w/o issues.   °

## 2021-11-05 ENCOUNTER — Other Ambulatory Visit: Payer: Self-pay | Admitting: Internal Medicine

## 2021-12-01 ENCOUNTER — Other Ambulatory Visit: Payer: Self-pay | Admitting: Internal Medicine

## 2021-12-01 MED ORDER — TRAZODONE HCL 50 MG PO TABS
ORAL_TABLET | ORAL | 1 refills | Status: DC
Start: 2021-12-01 — End: 2022-08-10

## 2022-01-26 NOTE — Telephone Encounter (Signed)
See Dr. Lupita Dawn comment.

## 2022-05-06 ENCOUNTER — Other Ambulatory Visit: Payer: Self-pay | Admitting: Internal Medicine

## 2022-08-10 ENCOUNTER — Encounter: Payer: Self-pay | Admitting: Internal Medicine

## 2022-08-10 ENCOUNTER — Ambulatory Visit (INDEPENDENT_AMBULATORY_CARE_PROVIDER_SITE_OTHER): Payer: BC Managed Care – PPO | Admitting: Internal Medicine

## 2022-08-10 VITALS — BP 106/74 | HR 84 | Temp 98.4°F | Ht 63.0 in | Wt 139.6 lb

## 2022-08-10 DIAGNOSIS — R5383 Other fatigue: Secondary | ICD-10-CM | POA: Diagnosis not present

## 2022-08-10 DIAGNOSIS — D751 Secondary polycythemia: Secondary | ICD-10-CM | POA: Diagnosis not present

## 2022-08-10 DIAGNOSIS — Z1231 Encounter for screening mammogram for malignant neoplasm of breast: Secondary | ICD-10-CM

## 2022-08-10 DIAGNOSIS — E538 Deficiency of other specified B group vitamins: Secondary | ICD-10-CM | POA: Diagnosis not present

## 2022-08-10 DIAGNOSIS — I10 Essential (primary) hypertension: Secondary | ICD-10-CM | POA: Diagnosis not present

## 2022-08-10 DIAGNOSIS — Z0000 Encounter for general adult medical examination without abnormal findings: Secondary | ICD-10-CM | POA: Diagnosis not present

## 2022-08-10 DIAGNOSIS — E559 Vitamin D deficiency, unspecified: Secondary | ICD-10-CM

## 2022-08-10 DIAGNOSIS — E876 Hypokalemia: Secondary | ICD-10-CM

## 2022-08-10 DIAGNOSIS — J4530 Mild persistent asthma, uncomplicated: Secondary | ICD-10-CM

## 2022-08-10 DIAGNOSIS — E663 Overweight: Secondary | ICD-10-CM | POA: Diagnosis not present

## 2022-08-10 LAB — CBC WITH DIFFERENTIAL/PLATELET
Basophils Absolute: 0 10*3/uL (ref 0.0–0.1)
Basophils Relative: 0.6 % (ref 0.0–3.0)
Eosinophils Absolute: 0.3 10*3/uL (ref 0.0–0.7)
Eosinophils Relative: 5.5 % — ABNORMAL HIGH (ref 0.0–5.0)
HCT: 47.3 % — ABNORMAL HIGH (ref 36.0–46.0)
Hemoglobin: 16 g/dL — ABNORMAL HIGH (ref 12.0–15.0)
Lymphocytes Relative: 27.1 % (ref 12.0–46.0)
Lymphs Abs: 1.5 10*3/uL (ref 0.7–4.0)
MCHC: 33.8 g/dL (ref 30.0–36.0)
MCV: 92 fl (ref 78.0–100.0)
Monocytes Absolute: 0.6 10*3/uL (ref 0.1–1.0)
Monocytes Relative: 10.7 % (ref 3.0–12.0)
Neutro Abs: 3.2 10*3/uL (ref 1.4–7.7)
Neutrophils Relative %: 56.1 % (ref 43.0–77.0)
Platelets: 288 10*3/uL (ref 150.0–400.0)
RBC: 5.14 Mil/uL — ABNORMAL HIGH (ref 3.87–5.11)
RDW: 12.5 % (ref 11.5–15.5)
WBC: 5.6 10*3/uL (ref 4.0–10.5)

## 2022-08-10 LAB — COMPREHENSIVE METABOLIC PANEL
ALT: 25 U/L (ref 0–35)
AST: 34 U/L (ref 0–37)
Albumin: 4.4 g/dL (ref 3.5–5.2)
Alkaline Phosphatase: 84 U/L (ref 39–117)
BUN: 11 mg/dL (ref 6–23)
CO2: 28 mEq/L (ref 19–32)
Calcium: 9.6 mg/dL (ref 8.4–10.5)
Chloride: 101 mEq/L (ref 96–112)
Creatinine, Ser: 0.72 mg/dL (ref 0.40–1.20)
GFR: 86.07 mL/min (ref >60.00)
Glucose, Bld: 88 mg/dL (ref 70–99)
Potassium: 3.4 mEq/L — ABNORMAL LOW (ref 3.5–5.1)
Sodium: 136 mEq/L (ref 135–145)
Total Bilirubin: 0.6 mg/dL (ref 0.2–1.2)
Total Protein: 7 g/dL (ref 6.0–8.3)

## 2022-08-10 LAB — LIPID PANEL
Cholesterol: 140 mg/dL (ref 0–200)
HDL: 47.8 mg/dL (ref >39.00)
LDL Cholesterol: 71 mg/dL (ref 0–99)
NonHDL: 92.38
Total CHOL/HDL Ratio: 3
Triglycerides: 108 mg/dL (ref 0.0–149.0)
VLDL: 21.6 mg/dL (ref 0.0–40.0)

## 2022-08-10 LAB — HEMOGLOBIN A1C: Hgb A1c MFr Bld: 5.3 % (ref 4.6–6.5)

## 2022-08-10 LAB — VITAMIN D 25 HYDROXY (VIT D DEFICIENCY, FRACTURES): VITD: 31.91 ng/mL (ref 30.00–100.00)

## 2022-08-10 LAB — TSH: TSH: 1.96 u[IU]/mL (ref 0.35–5.50)

## 2022-08-10 MED ORDER — TRAZODONE HCL 50 MG PO TABS: ORAL_TABLET | ORAL | 3 refills | Status: DC

## 2022-08-11 DIAGNOSIS — E876 Hypokalemia: Secondary | ICD-10-CM | POA: Insufficient documentation

## 2022-08-11 MED ORDER — POTASSIUM CHLORIDE CRYS ER 20 MEQ PO TBCR: 20.0000 meq | EXTENDED_RELEASE_TABLET | Freq: Every day | ORAL | 0 refills | Status: DC

## 2022-08-11 NOTE — Assessment & Plan Note (Signed)
Well controlled on current regimen of amlodipine and telmisartan . Renal function is due , no changes today.  Lab Results  Component Value Date   CREATININE 0.72 08/10/2022   Lab Results  Component Value Date   NA 136 08/10/2022   K 3.4 (L) 08/10/2022   CL 101 08/10/2022   CO2 28 08/10/2022

## 2022-08-11 NOTE — Assessment & Plan Note (Signed)
With low mg likely. Given the recent occurrence of diarrhea   Will replace potassium  and repeat in one month  Lab Results  Component Value Date   NA 136 08/10/2022   K 3.4 (L) 08/10/2022   CL 101 08/10/2022   CO2 28 08/10/2022

## 2022-08-11 NOTE — Assessment & Plan Note (Signed)
Likely due to hemoconcentration.  Previous occurrence in 2019 resulted in hematology referral and PCV was ruled out. Plan to  repeat CBC in 4 weeks  Lab Results  Component Value Date   WBC 5.6 08/10/2022   HGB 16.0 (H) 08/10/2022   HCT 47.3 (H) 08/10/2022   MCV 92.0 08/10/2022   PLT 288.0 08/10/2022

## 2022-08-11 NOTE — Progress Notes (Signed)
Patient ID: Jean Luna, female    DOB: 1955-10-19  Age: 67 y.o. MRN: [id number]  The patient is here for annual preventive examination and management of other chronic and acute problems.   The risk factors are reflected in the social history.   The roster of all physicians providing medical care to patient - is listed in the Snapshot section of the chart.   Activities of daily living:  The patient is 100% independent in all ADLs: dressing, toileting, feeding as well as independent mobility   Home safety : The patient has smoke detectors in the home. They wear seatbelts.  There are no unsecured firearms at home. There is no violence in the home.    There is no risks for hepatitis, STDs or HIV. There is no   history of blood transfusion. They have no travel history to infectious disease endemic areas of the world.   The patient has seen their dentist in the last six month. They have seen their eye doctor in the last year. The patinet  denies slight hearing difficulty with regard to whispered voices and some television programs.  They have deferred audiologic testing in the last year.  They do not  have excessive sun exposure. Discussed the need for sun protection: hats, long sleeves and use of sunscreen if there is significant sun exposure.    Diet: the importance of a healthy diet is discussed. They do have a healthy diet.   The benefits of regular aerobic exercise were discussed. The patient  exercises  3 to 5 days per week  for  60 minutes.    Depression screen: there are no signs or vegative symptoms of depression- irritability, change in appetite, anhedonia, sadness/tearfullness.   The following portions of the patient's history were reviewed and updated as appropriate: allergies, current medications, past family history, past medical history,  past surgical history, past social history  and problem list.   Visual acuity was not assessed per patient preference since the patient has  regular follow up with an  ophthalmologist. Hearing and body mass index were assessed and reviewed.    During the course of the visit the patient was educated and counseled about appropriate screening and preventive services including : fall prevention , diabetes screening, nutrition counseling, colorectal cancer screening, and recommended immunizations.    Chief Complaint:  Jean Luna is recovering from a viral gastroenteritis that has caused frequent loose stools for the past 48 to 72 hours.  Jean Luna has not had any fevers , blood in stools, or vomiting, but has felt bloated and anorexisc.  Her last stool was yesterday after taking a second dose of Imodium Jean Luna has several close friends who have also been ill but they have not dined together in nearly  days and there is no commonality that explains the cluster of illness .  Jean Luna reports a weight loss of 3 lbs since the illness began    Review of Symptoms  Patient denies headache, fevers,  skin rash, eye pain, sinus congestion and sinus pain, sore throat, dysphagia,  hemoptysis , cough, dyspnea, wheezing, chest pain, palpitations, orthopnea, edema,  flank pain, dysuria, hematuria, urinary  Frequency, nocturia, numbness, tingling, seizures,  Focal weakness, Loss of consciousness,  Tremor, insomnia, depression, anxiety, and suicidal ideation.    Physical Exam:  BP 106/74   Pulse 84   Temp 98.4 F (36.9 C) (Oral)   Ht 5\' 3"  (1.6 m)   Wt 139 lb 9.6 oz (63.3 kg)  LMP 02/15/2003   SpO2 98%   BMI 24.73 kg/m    Physical Exam Vitals reviewed.  Constitutional:      General: Jean Luna is not in acute distress.    Appearance: Normal appearance. Jean Luna is well-developed and normal weight. Jean Luna is not ill-appearing, toxic-appearing or diaphoretic.  HENT:     Head: Normocephalic.     Right Ear: Tympanic membrane, ear canal and external ear normal. There is no impacted cerumen.     Left Ear: Tympanic membrane, ear canal and external ear normal. There is no impacted  cerumen.     Nose: Nose normal.     Mouth/Throat:     Mouth: Mucous membranes are moist.     Pharynx: Oropharynx is clear.  Eyes:     General: No scleral icterus.       Right eye: No discharge.        Left eye: No discharge.     Conjunctiva/sclera: Conjunctivae normal.     Pupils: Pupils are equal, round, and reactive to light.  Neck:     Thyroid: No thyromegaly.     Vascular: No carotid bruit or JVD.  Cardiovascular:     Rate and Rhythm: Normal rate and regular rhythm.     Heart sounds: Normal heart sounds.  Pulmonary:     Effort: Pulmonary effort is normal. No respiratory distress.     Breath sounds: Normal breath sounds.  Chest:  Breasts:    Breasts are symmetrical.     Right: Normal. No swelling, inverted nipple, mass, nipple discharge, skin change or tenderness.     Left: Normal. No swelling, inverted nipple, mass, nipple discharge, skin change or tenderness.  Abdominal:     General: Bowel sounds are normal.     Palpations: Abdomen is soft. There is no mass.     Tenderness: There is no abdominal tenderness. There is no guarding or rebound.  Musculoskeletal:        General: Normal range of motion.     Cervical back: Normal range of motion and neck supple.  Lymphadenopathy:     Cervical: No cervical adenopathy.     Upper Body:     Right upper body: No supraclavicular, axillary or pectoral adenopathy.     Left upper body: No supraclavicular, axillary or pectoral adenopathy.  Skin:    General: Skin is warm and dry.  Neurological:     General: No focal deficit present.     Mental Status: Jean Luna is alert and oriented to person, place, and time. Mental status is at baseline.  Psychiatric:        Mood and Affect: Mood normal.        Behavior: Behavior normal.        Thought Content: Thought content normal.        Judgment: Judgment normal.     Assessment and Plan: Encounter for screening mammogram for malignant neoplasm of breast -     3D Screening Mammogram, Left and  Right; Future  Overweight (BMI 25.0-29.9) -     Lipid panel -     Comprehensive metabolic panel -     Hemoglobin A1c  Encounter for preventive health examination  Fatigue, unspecified type -     TSH -     CBC with Differential/Platelet  Vitamin D deficiency Assessment & Plan: Normalized.  Contiinue current supplementation  2000 IUs daily   Orders: -     VITAMIN D 25 Hydroxy (Vit-D Deficiency, Fractures)  Erythrocytosis Assessment & Plan: Likely  due to hemoconcentration.  Previous occurrence in 2019 resulted in hematology referral and PCV was ruled out. Plan to  repeat CBC in 4 weeks  Lab Results  Component Value Date   WBC 5.6 08/10/2022   HGB 16.0 (H) 08/10/2022   HCT 47.3 (H) 08/10/2022   MCV 92.0 08/10/2022   PLT 288.0 08/10/2022     Orders: -     CBC with Differential/Platelet; Future  Essential hypertension Assessment & Plan: Well controlled on current regimen of amlodipine and telmisartan . Renal function is due , no changes today.  Lab Results  Component Value Date   CREATININE 0.72 08/10/2022   Lab Results  Component Value Date   NA 136 08/10/2022   K 3.4 (L) 08/10/2022   CL 101 08/10/2022   CO2 28 08/10/2022      Hypokalemia due to excessive gastrointestinal loss of potassium Assessment & Plan: With low mg likely. Given the recent occurrence of diarrhea   Will replace potassium  and repeat in one month  Lab Results  Component Value Date   NA 136 08/10/2022   K 3.4 (L) 08/10/2022   CL 101 08/10/2022   CO2 28 08/10/2022     Orders: -     Basic metabolic panel; Future -     Magnesium; Future  Mild persistent asthma with allergic rhinitis without complication Assessment & Plan: Jean Luna is not wheezing on exam.  Chest  X ray is normal  Unclear if symptoms are due to VCD,  pulmonary hypertension, or deconditioning.  PE unlikely as there are no risk factors and Jean Luna is not hypoxic or tachycardic. Jean Luna will follow up with Dr Mariah Milling for a repeat  ECHO   Routine general medical examination at a health care facility Assessment & Plan: age appropriate education and counseling updated, referrals for preventative services and immunizations addressed, dietary and smoking counseling addressed, most recent labs reviewed.  I have personally reviewed and have noted:   1) the patient's medical and social history 2) The pt's use of alcohol, tobacco, and illicit drugs 3) The patient's current medications and supplements 4) Functional ability including ADL's, fall risk, home safety risk, hearing and visual impairment 5) Diet and physical activities 6) Evidence for depression or mood disorder 7) The patient's height, weight, and BMI have been recorded in the chartI have made referrals, and provided counseling and education based on review of the above    Vitamin B12 deficiency without anemia Assessment & Plan: Managed with sublingual supplementation since 2019     Other orders -     traZODone HCl; TAKE ONE-HALF TO ONE TABLET BY MOUTH ONEHOUR BEFORE BEDTIME  Dispense: 90 tablet; Refill: 3    No follow-ups on file.  Sherlene Shams, MD

## 2022-08-11 NOTE — Assessment & Plan Note (Signed)
Normalized.  Contiinue current supplementation  2000 IUs daily

## 2022-08-11 NOTE — Assessment & Plan Note (Signed)
Managed with sublingual supplementation since 2019

## 2022-08-11 NOTE — Assessment & Plan Note (Signed)
She is not wheezing on exam.  Chest  X ray is normal  Unclear if symptoms are due to VCD,  pulmonary hypertension, or deconditioning.  PE unlikely as there are no risk factors and she is not hypoxic or tachycardic. She will follow up with Dr Gollan for a repeat ECHO 

## 2022-08-11 NOTE — Assessment & Plan Note (Signed)

## 2022-08-15 ENCOUNTER — Other Ambulatory Visit: Payer: Self-pay | Admitting: Internal Medicine

## 2022-08-15 MED ORDER — AZITHROMYCIN 500 MG PO TABS: 500.0000 mg | ORAL_TABLET | Freq: Every day | ORAL | 0 refills | Status: AC

## 2022-08-17 ENCOUNTER — Other Ambulatory Visit: Payer: Self-pay | Admitting: Internal Medicine

## 2022-08-17 MED ORDER — SULFAMETHOXAZOLE-TRIMETHOPRIM 800-160 MG PO TABS: 1.0000 | ORAL_TABLET | Freq: Two times a day (BID) | ORAL | 0 refills | Status: AC

## 2022-09-13 ENCOUNTER — Ambulatory Visit
Admission: RE | Admit: 2022-09-13 | Discharge: 2022-09-13 | Disposition: A | Discharge status: Home / Self Care | Payer: BC Managed Care – PPO | Source: Ambulatory Visit | Attending: Internal Medicine | Admitting: Internal Medicine

## 2022-09-13 DIAGNOSIS — Z1231 Encounter for screening mammogram for malignant neoplasm of breast: Secondary | ICD-10-CM | POA: Insufficient documentation

## 2022-11-10 ENCOUNTER — Other Ambulatory Visit: Payer: Self-pay | Admitting: Internal Medicine

## 2023-01-14 ENCOUNTER — Other Ambulatory Visit: Payer: Self-pay | Admitting: Internal Medicine

## 2023-01-14 DIAGNOSIS — R059 Cough, unspecified: Secondary | ICD-10-CM

## 2023-01-18 ENCOUNTER — Encounter: Payer: Self-pay | Admitting: Internal Medicine

## 2023-06-15 ENCOUNTER — Other Ambulatory Visit: Payer: Self-pay | Admitting: Internal Medicine

## 2023-06-15 MED ORDER — PREDNISONE 10 MG PO TABS: ORAL_TABLET | ORAL | 0 refills | Status: DC

## 2023-06-27 ENCOUNTER — Other Ambulatory Visit: Payer: Self-pay | Admitting: Internal Medicine

## 2023-06-27 MED ORDER — VALACYCLOVIR HCL 1 G PO TABS: ORAL_TABLET | ORAL | 3 refills | Status: AC

## 2023-06-27 MED ORDER — CEPHALEXIN 500 MG PO CAPS: 500.0000 mg | ORAL_CAPSULE | Freq: Four times a day (QID) | ORAL | 0 refills | Status: DC

## 2023-06-27 MED ORDER — PREDNISONE 10 MG PO TABS: ORAL_TABLET | ORAL | 0 refills | Status: DC

## 2023-08-14 ENCOUNTER — Encounter: Payer: Self-pay | Admitting: Internal Medicine

## 2023-08-14 ENCOUNTER — Ambulatory Visit (INDEPENDENT_AMBULATORY_CARE_PROVIDER_SITE_OTHER): Payer: BC Managed Care – PPO | Admitting: Internal Medicine

## 2023-08-14 VITALS — BP 122/78 | HR 84 | Ht 63.0 in | Wt 145.2 lb

## 2023-08-14 DIAGNOSIS — Z23 Encounter for immunization: Secondary | ICD-10-CM

## 2023-08-14 DIAGNOSIS — R7301 Impaired fasting glucose: Secondary | ICD-10-CM | POA: Diagnosis not present

## 2023-08-14 DIAGNOSIS — Z1231 Encounter for screening mammogram for malignant neoplasm of breast: Secondary | ICD-10-CM

## 2023-08-14 DIAGNOSIS — I10 Essential (primary) hypertension: Secondary | ICD-10-CM | POA: Diagnosis not present

## 2023-08-14 DIAGNOSIS — Z0000 Encounter for general adult medical examination without abnormal findings: Secondary | ICD-10-CM | POA: Diagnosis not present

## 2023-08-14 DIAGNOSIS — R5383 Other fatigue: Secondary | ICD-10-CM

## 2023-08-14 DIAGNOSIS — J3089 Other allergic rhinitis: Secondary | ICD-10-CM

## 2023-08-14 DIAGNOSIS — E785 Hyperlipidemia, unspecified: Secondary | ICD-10-CM | POA: Diagnosis not present

## 2023-08-14 LAB — CBC WITH DIFFERENTIAL/PLATELET
Basophils Absolute: 0 10*3/uL (ref 0.0–0.1)
Basophils Relative: 0.4 % (ref 0.0–3.0)
Eosinophils Absolute: 0.5 10*3/uL (ref 0.0–0.7)
Eosinophils Relative: 6.7 % — ABNORMAL HIGH (ref 0.0–5.0)
HCT: 42.8 % (ref 36.0–46.0)
Hemoglobin: 14.5 g/dL (ref 12.0–15.0)
Lymphocytes Relative: 13.8 % (ref 12.0–46.0)
Lymphs Abs: 1.1 10*3/uL (ref 0.7–4.0)
MCHC: 33.9 g/dL (ref 30.0–36.0)
MCV: 90.9 fl (ref 78.0–100.0)
Monocytes Absolute: 0.7 10*3/uL (ref 0.1–1.0)
Monocytes Relative: 8.3 % (ref 3.0–12.0)
Neutro Abs: 5.7 10*3/uL (ref 1.4–7.7)
Neutrophils Relative %: 70.8 % (ref 43.0–77.0)
Platelets: 273 10*3/uL (ref 150.0–400.0)
RBC: 4.71 Mil/uL (ref 3.87–5.11)
RDW: 12.9 % (ref 11.5–15.5)
WBC: 8.1 10*3/uL (ref 4.0–10.5)

## 2023-08-14 LAB — LIPID PANEL
Cholesterol: 184 mg/dL (ref 0–200)
HDL: 58.1 mg/dL (ref >39.00)
LDL Cholesterol: 92 mg/dL (ref 0–99)
NonHDL: 126.13
Total CHOL/HDL Ratio: 3
Triglycerides: 172 mg/dL — ABNORMAL HIGH (ref 0.0–149.0)
VLDL: 34.4 mg/dL (ref 0.0–40.0)

## 2023-08-14 LAB — COMPREHENSIVE METABOLIC PANEL WITH GFR
ALT: 17 U/L (ref 0–35)
AST: 20 U/L (ref 0–37)
Albumin: 4.2 g/dL (ref 3.5–5.2)
Alkaline Phosphatase: 86 U/L (ref 39–117)
BUN: 13 mg/dL (ref 6–23)
CO2: 27 meq/L (ref 19–32)
Calcium: 9.2 mg/dL (ref 8.4–10.5)
Chloride: 105 meq/L (ref 96–112)
Creatinine, Ser: 0.63 mg/dL (ref 0.40–1.20)
GFR: 90.67 mL/min (ref >60.00)
Glucose, Bld: 96 mg/dL (ref 70–99)
Potassium: 3.8 meq/L (ref 3.5–5.1)
Sodium: 140 meq/L (ref 135–145)
Total Bilirubin: 0.7 mg/dL (ref 0.2–1.2)
Total Protein: 6.7 g/dL (ref 6.0–8.3)

## 2023-08-14 LAB — LDL CHOLESTEROL, DIRECT: Direct LDL: 105 mg/dL

## 2023-08-14 LAB — MICROALBUMIN / CREATININE URINE RATIO
Creatinine,U: 88.4 mg/dL
Microalb Creat Ratio: UNDETERMINED mg/g (ref 0.0–30.0)
Microalb, Ur: <0.7 mg/dL

## 2023-08-14 LAB — HEMOGLOBIN A1C: Hgb A1c MFr Bld: 5.6 % (ref 4.6–6.5)

## 2023-08-14 LAB — TSH: TSH: 1.23 u[IU]/mL (ref 0.35–5.50)

## 2023-08-14 MED ORDER — PREDNISONE 10 MG PO TABS: ORAL_TABLET | ORAL | 0 refills | Status: DC

## 2023-08-14 MED ORDER — TRIAMCINOLONE ACETONIDE 0.5 % EX OINT: 1.0000 | TOPICAL_OINTMENT | Freq: Two times a day (BID) | CUTANEOUS | 2 refills | Status: AC

## 2023-08-14 MED ORDER — TRAZODONE HCL 50 MG PO TABS: ORAL_TABLET | ORAL | 3 refills | Status: AC

## 2023-08-14 NOTE — Progress Notes (Signed)
 Patient ID: Jean Luna, female    DOB: 1955-11-21  Age: 68 y.o. MRN: [id number]  The patient is here for annual preventive examination and management of other chronic and acute problems.   The risk factors are reflected in the social history.   The roster of all physicians providing medical care to patient - is listed in the Snapshot section of the chart.   Activities of daily living:  The patient is 100% independent in all ADLs: dressing, toileting, feeding as well as independent mobility   Home safety : The patient has smoke detectors in the home. They wear seatbelts.  There are no unsecured firearms at home. There is no violence in the home.    There is no risks for hepatitis, STDs or HIV. There is no   history of blood transfusion. They have no travel history to infectious disease endemic areas of the world.   The patient has seen their dentist in the last six month. They have seen their eye doctor in the last year. The patinet  denies slight hearing difficulty with regard to whispered voices and some television programs.  They have deferred audiologic testing in the last year.  They do not  have excessive sun exposure. Discussed the need for sun protection: hats, long sleeves and use of sunscreen if there is significant sun exposure.    Diet: the importance of a healthy diet is discussed. They do have a healthy diet.   The benefits of regular aerobic exercise were discussed. The patient  is physically active  5 days per week     Depression screen: there are no signs or vegative symptoms of depression- irritability, change in appetite, anhedonia, sadness/tearfullness.   The following portions of the patient's history were reviewed and updated as appropriate: allergies, current medications, past family history, past medical history,  past surgical history, past social history  and problem list.   Visual acuity was not assessed per patient preference since the patient has regular  follow up with an  ophthalmologist. Hearing and body mass index were assessed and reviewed.    During the course of the visit the patient was educated and counseled about appropriate screening and preventive services including : fall prevention , diabetes screening, nutrition counseling, colorectal cancer screening, and recommended immunizations.    Chief Complaint:   1) allergic rhinitis:   symptoms of recurrent sneezing and congestion not resolved with claritin.  Now using Xhance prescribed by Wilmer Hasten and claritin . No significant change .  persistent cough for months.  Has had 7 prednisone  tapers since Sept 2024      Review of Symptoms  Patient denies headache, fevers, malaise, unintentional weight loss, skin rash, eye pain, sinus congestion and sinus pain, sore throat, dysphagia,  hemoptysis , cough, dyspnea, wheezing, chest pain, palpitations, orthopnea, edema, abdominal pain, nausea, melena, diarrhea, constipation, flank pain, dysuria, hematuria, urinary  Frequency, nocturia, numbness, tingling, seizures,  Focal weakness, Loss of consciousness,  Tremor, insomnia, depression, anxiety, and suicidal ideation.    Physical Exam:  BP 122/78   Pulse 84   Ht 5' 3 (1.6 m)   Wt 145 lb 3.2 oz (65.9 kg)   LMP 02/15/2003   SpO2 96%   BMI 25.72 kg/m    Physical Exam Vitals reviewed.  Constitutional:      General: She is not in acute distress.    Appearance: Normal appearance. She is well-developed and normal weight. She is not ill-appearing, toxic-appearing or diaphoretic.  HENT:  Head: Normocephalic.     Right Ear: Tympanic membrane, ear canal and external ear normal. There is no impacted cerumen.     Left Ear: Tympanic membrane, ear canal and external ear normal. There is no impacted cerumen.     Nose: Nose normal.     Mouth/Throat:     Mouth: Mucous membranes are moist.     Pharynx: Oropharynx is clear.   Eyes:     General: No scleral icterus.       Right eye: No  discharge.        Left eye: No discharge.     Conjunctiva/sclera: Conjunctivae normal.     Pupils: Pupils are equal, round, and reactive to light.   Neck:     Thyroid : No thyromegaly.     Vascular: No carotid bruit or JVD.   Cardiovascular:     Rate and Rhythm: Normal rate and regular rhythm.     Heart sounds: Normal heart sounds.  Pulmonary:     Effort: Pulmonary effort is normal. No respiratory distress.     Breath sounds: Normal breath sounds.  Chest:  Breasts:    Breasts are symmetrical.     Right: Normal. No swelling, inverted nipple, mass, nipple discharge, skin change or tenderness.     Left: Normal. No swelling, inverted nipple, mass, nipple discharge, skin change or tenderness.  Abdominal:     General: Bowel sounds are normal.     Palpations: Abdomen is soft. There is no mass.     Tenderness: There is no abdominal tenderness. There is no guarding or rebound.   Musculoskeletal:        General: Normal range of motion.     Cervical back: Normal range of motion and neck supple.  Lymphadenopathy:     Cervical: No cervical adenopathy.     Upper Body:     Right upper body: No supraclavicular, axillary or pectoral adenopathy.     Left upper body: No supraclavicular, axillary or pectoral adenopathy.   Skin:    General: Skin is warm and dry.   Neurological:     General: No focal deficit present.     Mental Status: She is alert and oriented to person, place, and time. Mental status is at baseline.   Psychiatric:        Mood and Affect: Mood normal.        Behavior: Behavior normal.        Thought Content: Thought content normal.        Judgment: Judgment normal.     Assessment and Plan: Encounter for screening mammogram for malignant neoplasm of breast -     3D Screening Mammogram, Left and Right; Future  Essential hypertension Assessment & Plan: Well controlled on current regimen of amlodipine  and telmisartan  . Renal function is due , no changes today.  Lab  Results  Component Value Date   CREATININE 0.72 08/10/2022   Lab Results  Component Value Date   NA 136 08/10/2022   K 3.4 (L) 08/10/2022   CL 101 08/10/2022   CO2 28 08/10/2022     Orders: -     Comprehensive metabolic panel with GFR -     Microalbumin / creatinine urine ratio  Encounter for preventive health examination  Fatigue, unspecified type -     CBC with Differential/Platelet -     TSH  Hyperlipidemia, unspecified hyperlipidemia type -     Lipid panel -     LDL cholesterol, direct  Impaired fasting  glucose -     Comprehensive metabolic panel with GFR -     Hemoglobin A1c  Need for Tdap vaccination -     Tdap vaccine greater than or equal to 7yo IM  Routine general medical examination at a health care facility Assessment & Plan: age appropriate education and counseling updated, referrals for preventative services and immunizations addressed, dietary and smoking counseling addressed, most recent labs reviewed.  I have personally reviewed and have noted:   1) the patient's medical and social history 2) The pt's use of alcohol, tobacco, and illicit drugs 3) The patient's current medications and supplements 4) Functional ability including ADL's, fall risk, home safety risk, hearing and visual impairment 5) Diet and physical activities 6) Evidence for depression or mood disorder 7) The patient's height, weight, and BMI have been recorded in the chart  I have made referrals, and provided counseling and education based on review of the above    Allergic rhinitis due to fungal spores, unspecified seasonality Assessment & Plan: Advised to try adding 2nd soe of claritin at bedtime; continue Xhance and add azelastine/fluticasone  nasal spray once daily (she already has some)   Other orders -     traZODone  HCl; TAKE ONE-HALF TO ONE TABLET BY MOUTH ONEHOUR BEFORE BEDTIME  Dispense: 90 tablet; Refill: 3 -     predniSONE ; 6 tablets on Day 1 , then reduce by 1 tablet  daily until gone  Dispense: 21 tablet; Refill: 0 -     Triamcinolone  Acetonide; Apply 1 Application topically 2 (two) times daily. As needed for irritation  Dispense: 30 g; Refill: 2    Return in about 1 year (around 08/13/2024).  Verneita LITTIE Kettering, MD

## 2023-08-14 NOTE — Assessment & Plan Note (Signed)

## 2023-08-14 NOTE — Assessment & Plan Note (Signed)
Well controlled on current regimen of amlodipine and telmisartan . Renal function is due , no changes today.  Lab Results  Component Value Date   CREATININE 0.72 08/10/2022   Lab Results  Component Value Date   NA 136 08/10/2022   K 3.4 (L) 08/10/2022   CL 101 08/10/2022   CO2 28 08/10/2022

## 2023-08-14 NOTE — Patient Instructions (Signed)
 Increase claritin to every 12 hours  Add dymista nasal spray once daily (12 hours from the Xhance)  Prednisone  taper sent '[facility]  TRIAMCINOLONE  OINTMENT FOR BUG BITES

## 2023-08-14 NOTE — Assessment & Plan Note (Signed)
 Advised to try adding 2nd soe of claritin at bedtime; continue Xhance and add azelastine/fluticasone  nasal spray once daily (she already has some)

## 2023-08-15 ENCOUNTER — Ambulatory Visit: Payer: Self-pay | Admitting: Internal Medicine

## 2023-08-17 ENCOUNTER — Other Ambulatory Visit: Payer: Self-pay | Admitting: Internal Medicine

## 2023-08-17 DIAGNOSIS — J45901 Unspecified asthma with (acute) exacerbation: Secondary | ICD-10-CM | POA: Insufficient documentation

## 2023-08-17 MED ORDER — HYDROCOD POLI-CHLORPHE POLI ER 10-8 MG/5ML PO SUER: 5.0000 mL | Freq: Two times a day (BID) | ORAL | 0 refills | Status: AC | PRN

## 2023-08-17 MED ORDER — AZITHROMYCIN 500 MG PO TABS: 500.0000 mg | ORAL_TABLET | Freq: Every day | ORAL | 0 refills | Status: AC

## 2023-08-17 NOTE — Assessment & Plan Note (Signed)
 Prednisone  and tussionex prescribed.  Advised to add azithromycin  for fevers, purulent sputum and notify MD

## 2023-09-14 ENCOUNTER — Ambulatory Visit
Admission: RE | Admit: 2023-09-14 | Discharge: 2023-09-14 | Disposition: A | Discharge status: Home / Self Care | Source: Ambulatory Visit | Attending: Internal Medicine | Admitting: Internal Medicine

## 2023-09-14 DIAGNOSIS — Z1231 Encounter for screening mammogram for malignant neoplasm of breast: Secondary | ICD-10-CM | POA: Insufficient documentation

## 2023-09-20 ENCOUNTER — Other Ambulatory Visit: Payer: Self-pay | Admitting: Internal Medicine

## 2023-09-20 MED ORDER — AMOXICILLIN-POT CLAVULANATE 875-125 MG PO TABS: 1.0000 | ORAL_TABLET | Freq: Two times a day (BID) | ORAL | 0 refills | Status: DC

## 2023-09-20 MED ORDER — PREDNISONE 10 MG PO TABS: ORAL_TABLET | ORAL | 0 refills | Status: DC

## 2023-10-08 ENCOUNTER — Encounter: Payer: Self-pay | Admitting: Internal Medicine

## 2023-10-08 DIAGNOSIS — J4550 Severe persistent asthma, uncomplicated: Secondary | ICD-10-CM | POA: Insufficient documentation

## 2023-11-07 ENCOUNTER — Other Ambulatory Visit: Payer: Self-pay | Admitting: Internal Medicine

## 2023-12-15 ENCOUNTER — Other Ambulatory Visit: Payer: Self-pay | Admitting: Internal Medicine

## 2023-12-15 MED ORDER — NYSTATIN 100000 UNIT/ML MT SUSP: 5.0000 mL | Freq: Four times a day (QID) | OROMUCOSAL | 0 refills | Status: AC

## 2024-02-05 ENCOUNTER — Other Ambulatory Visit: Payer: Self-pay | Admitting: Internal Medicine

## 2024-02-05 MED ORDER — AMOXICILLIN-POT CLAVULANATE 875-125 MG PO TABS: 1.0000 | ORAL_TABLET | Freq: Two times a day (BID) | ORAL | 0 refills | Status: AC

## 2024-02-05 MED ORDER — PREDNISONE 10 MG PO TABS: ORAL_TABLET | ORAL | 0 refills | Status: AC

## 2024-08-14 ENCOUNTER — Encounter: Admitting: Internal Medicine
